# Patient Record
Sex: Female | Born: 1970 | Race: White | Hispanic: No | Marital: Married | Smoking: Never smoker
Health system: Southern US, Community
[De-identification: ages and names within clinical notes are randomized; demographics above are authoritative.]

## PROBLEM LIST (undated history)

## (undated) DIAGNOSIS — K219 Gastro-esophageal reflux disease without esophagitis: Secondary | ICD-10-CM

## (undated) DIAGNOSIS — G4733 Obstructive sleep apnea (adult) (pediatric): Secondary | ICD-10-CM

## (undated) DIAGNOSIS — E039 Hypothyroidism, unspecified: Secondary | ICD-10-CM

## (undated) DIAGNOSIS — S83519A Sprain of anterior cruciate ligament of unspecified knee, initial encounter: Secondary | ICD-10-CM

## (undated) DIAGNOSIS — C801 Malignant (primary) neoplasm, unspecified: Secondary | ICD-10-CM

## (undated) DIAGNOSIS — G8929 Other chronic pain: Secondary | ICD-10-CM

## (undated) DIAGNOSIS — R51 Headache: Secondary | ICD-10-CM

## (undated) DIAGNOSIS — A159 Respiratory tuberculosis unspecified: Secondary | ICD-10-CM

## (undated) DIAGNOSIS — I2699 Other pulmonary embolism without acute cor pulmonale: Secondary | ICD-10-CM

## (undated) DIAGNOSIS — R7303 Prediabetes: Secondary | ICD-10-CM

## (undated) DIAGNOSIS — Z973 Presence of spectacles and contact lenses: Secondary | ICD-10-CM

## (undated) DIAGNOSIS — E079 Disorder of thyroid, unspecified: Secondary | ICD-10-CM

## (undated) DIAGNOSIS — I10 Essential (primary) hypertension: Secondary | ICD-10-CM

## (undated) DIAGNOSIS — Z201 Contact with and (suspected) exposure to tuberculosis: Secondary | ICD-10-CM

## (undated) DIAGNOSIS — E785 Hyperlipidemia, unspecified: Secondary | ICD-10-CM

## (undated) DIAGNOSIS — E119 Type 2 diabetes mellitus without complications: Secondary | ICD-10-CM

## (undated) HISTORY — DX: Other chronic pain: G89.29

## (undated) HISTORY — DX: Other pulmonary embolism without acute cor pulmonale: I26.99

## (undated) HISTORY — DX: Headache: R51

## (undated) HISTORY — PX: COLONOSCOPY: SHX174

## (undated) HISTORY — PX: HERNIA REPAIR: SHX51

## (undated) HISTORY — DX: Disorder of thyroid, unspecified: E07.9

## (undated) HISTORY — DX: Hyperlipidemia, unspecified: E78.5

## (undated) HISTORY — DX: Essential (primary) hypertension: I10

## (undated) SURGERY — Surgical Case
Anesthesia: *Unknown

---

## 1999-04-08 ENCOUNTER — Emergency Department (HOSPITAL_COMMUNITY): Admission: EM | Admit: 1999-04-08 | Discharge: 1999-04-08 | Payer: Self-pay

## 2000-09-01 ENCOUNTER — Encounter: Admission: RE | Admit: 2000-09-01 | Discharge: 2000-10-07 | Payer: Self-pay | Admitting: Family Medicine

## 2000-11-30 ENCOUNTER — Other Ambulatory Visit: Admission: RE | Admit: 2000-11-30 | Discharge: 2000-11-30 | Payer: Self-pay | Admitting: Family Medicine

## 2003-08-07 ENCOUNTER — Other Ambulatory Visit: Admission: RE | Admit: 2003-08-07 | Discharge: 2003-08-07 | Payer: Self-pay | Admitting: Obstetrics and Gynecology

## 2004-03-17 ENCOUNTER — Other Ambulatory Visit: Admission: RE | Admit: 2004-03-17 | Discharge: 2004-03-17 | Payer: Self-pay | Admitting: Obstetrics and Gynecology

## 2004-06-01 DIAGNOSIS — I2699 Other pulmonary embolism without acute cor pulmonale: Secondary | ICD-10-CM

## 2004-06-01 HISTORY — PX: COLPOSCOPY: SHX161

## 2004-06-01 HISTORY — DX: Other pulmonary embolism without acute cor pulmonale: I26.99

## 2004-09-15 ENCOUNTER — Other Ambulatory Visit: Admission: RE | Admit: 2004-09-15 | Discharge: 2004-09-15 | Payer: Self-pay | Admitting: Obstetrics and Gynecology

## 2004-10-30 DIAGNOSIS — Z86711 Personal history of pulmonary embolism: Secondary | ICD-10-CM

## 2004-10-30 HISTORY — DX: Personal history of pulmonary embolism: Z86.711

## 2004-11-18 ENCOUNTER — Inpatient Hospital Stay (HOSPITAL_COMMUNITY): Admission: AD | Admit: 2004-11-18 | Discharge: 2004-11-20 | Payer: Self-pay | Admitting: *Deleted

## 2004-11-18 ENCOUNTER — Encounter: Admission: RE | Admit: 2004-11-18 | Discharge: 2004-11-18 | Payer: Self-pay | Admitting: Internal Medicine

## 2005-01-20 ENCOUNTER — Ambulatory Visit: Payer: Self-pay | Admitting: Internal Medicine

## 2005-02-03 ENCOUNTER — Ambulatory Visit: Payer: Self-pay | Admitting: Hematology & Oncology

## 2005-04-17 ENCOUNTER — Other Ambulatory Visit: Admission: RE | Admit: 2005-04-17 | Discharge: 2005-04-17 | Payer: Self-pay | Admitting: Obstetrics and Gynecology

## 2005-08-26 ENCOUNTER — Encounter: Admission: RE | Admit: 2005-08-26 | Discharge: 2005-08-26 | Payer: Self-pay | Admitting: Internal Medicine

## 2006-02-08 ENCOUNTER — Encounter: Admission: RE | Admit: 2006-02-08 | Discharge: 2006-02-08 | Payer: Self-pay | Admitting: Endocrinology

## 2006-10-21 ENCOUNTER — Other Ambulatory Visit: Admission: RE | Admit: 2006-10-21 | Discharge: 2006-10-21 | Payer: Self-pay | Admitting: Obstetrics and Gynecology

## 2007-12-07 ENCOUNTER — Other Ambulatory Visit: Admission: RE | Admit: 2007-12-07 | Discharge: 2007-12-07 | Payer: Self-pay | Admitting: Obstetrics and Gynecology

## 2008-01-03 ENCOUNTER — Encounter: Admission: RE | Admit: 2008-01-03 | Discharge: 2008-01-03 | Payer: Self-pay | Admitting: Obstetrics and Gynecology

## 2008-05-27 ENCOUNTER — Emergency Department (HOSPITAL_BASED_OUTPATIENT_CLINIC_OR_DEPARTMENT_OTHER): Admission: EM | Admit: 2008-05-27 | Discharge: 2008-05-27 | Payer: Self-pay | Admitting: Emergency Medicine

## 2008-12-07 ENCOUNTER — Other Ambulatory Visit: Admission: RE | Admit: 2008-12-07 | Discharge: 2008-12-07 | Payer: Self-pay | Admitting: Obstetrics and Gynecology

## 2010-01-15 ENCOUNTER — Other Ambulatory Visit: Admission: RE | Admit: 2010-01-15 | Discharge: 2010-01-15 | Payer: Self-pay | Admitting: Obstetrics and Gynecology

## 2010-06-21 ENCOUNTER — Encounter: Payer: Self-pay | Admitting: Hematology & Oncology

## 2010-06-23 ENCOUNTER — Encounter: Payer: Self-pay | Admitting: Endocrinology

## 2010-06-23 ENCOUNTER — Encounter: Payer: Self-pay | Admitting: Obstetrics and Gynecology

## 2010-10-17 NOTE — H&P (Signed)
NAMERHANDA, LEMIRE                 ACCOUNT NO.:  [id number]   MEDICAL RECORD NO.:  [id number]          PATIENT TYPE:  INP   LOCATION:  3709                         FACILITY:  [facility]   PHYSICIAN:  Theone Stanley, MD   DATE OF BIRTH:  December 18, 1970   DATE OF ADMISSION:  11/18/2004  DATE OF DISCHARGE:                                HISTORY & PHYSICAL   CHIEF COMPLAINT:  Chest pain.   HISTORY OF PRESENT ILLNESS:  Mrs. Holly Leach is a very pleasant 40 year old female  who has had chest pain since Friday that is on her left side just under her  breast. It mainly occurred when she was walking. She had seen Dr. Sharlet Salina. In regards to this, it was felt that she might have some indigestion  and possibly GERD. She was placed on AcipHex; however, this did not help her  symptoms. She has been feeling overall not well and subsequently had a CT  today. It was found that she had a left lower lobe filling defect per verbal  report. Because of this, the patient has been admitted to telemetry. In  addition, she has been complaining of increasing headaches about three times  a day. The chest pain she describes as a dull pain. It has not gotten any  worse, does not radiate, and she has no shortness of breath.   PAST MEDICAL HISTORY:  1.  Tension headaches.  2.  Migraines associated with her menstrual cycle.  3.  Hypertension.   MEDICATIONS:  1.  Micronor contraception.  2.  Micardis/hydrochlorothiazide 40/12.5 mg 1 p.o. daily.  3.  Ambien CR 6.25 mg q.h.s. p.r.n.  4.  AcipHex b.i.d.   ALLERGIES:  None.   PAST SURGICAL HISTORY:  None.   FAMILY HISTORY:  Heart disease and diabetes.   SOCIAL HISTORY:  The patient is married. Has one biological child and one  stepchild. No tobacco, alcohol, or illicit drug use.   REVIEW OF SYMPTOMS:  No fever or chills. No dysuria, no hematuria. No change  in bowel habits, blood in stools, or dark tarry stools.   PHYSICAL EXAMINATION:  VITAL SIGNS:   Temperature of 98, pulse of 103,  respiratory rate of 17, blood pressure of 139/98. Saturating 98% on room  air. Weight of 151.6 pounds.  HEENT:  Normocephalic and atraumatic. Eyes are 3 mm. Pupils are equal and  reactive to light. Extraocular movements intact. Ears note no discharge.  Throat clear. No erythema and no exudate. Moist mucus membranes.  NECK:  Supple. No thyromegaly. No JVD.  HEART:  Regular rate and rhythm. No murmurs, gallops, or rubs.  LUNGS:  Clear to auscultation bilaterally.  ABDOMEN:  Soft, nontender, and nondistended.  EXTREMITIES:  No clubbing, cyanosis, or edema.  NEUROLOGICAL:  The patient is alert and oriented x3.  GENITOURINARY:  Deferred.   LABORATORY DATA:  CBC, BNP, PT, PTT, INR are pending.   ASSESSMENT:  A 40 year old female on contraceptive medication presenting  with pulmonary embolus.   PLAN:  1.  PE:  We will start her on Lovenox and Coumadin. We  will follow her      INR's. Place her on telemetry. She is informed that she can no longer      take her oral contraceptives and she will need to follow up with her      OB/GYN in regards to what type of method she can use. In the meantime,      she can use Barrier method. Because she is on oral contraceptive      medication, which increases her likelihood of clots, I will not work her      up at this time. The patient was informed about the plan and the fact      that she will mostly be on Coumadin for at least six months to a year.  2.  Headaches:  We will give her Tylenol on a p.r.n. basis.  3.  Hypertension:  Continue on her hypertensive medications.       AEJ/MEDQ  D:  11/18/2004  T:  11/18/2004  Job:  [id number]   cc:   Sharlet Salina, M.D.  [address]  [place]  [place] [address]  Fax: [phone]

## 2011-05-15 ENCOUNTER — Other Ambulatory Visit (HOSPITAL_COMMUNITY)
Admission: RE | Admit: 2011-05-15 | Discharge: 2011-05-15 | Disposition: A | Discharge status: Home / Self Care | Payer: BC Managed Care – PPO | Source: Ambulatory Visit | Attending: Obstetrics and Gynecology | Admitting: Obstetrics and Gynecology

## 2011-05-15 ENCOUNTER — Other Ambulatory Visit: Payer: Self-pay | Admitting: Obstetrics and Gynecology

## 2011-05-15 DIAGNOSIS — Z01419 Encounter for gynecological examination (general) (routine) without abnormal findings: Secondary | ICD-10-CM | POA: Insufficient documentation

## 2011-05-15 DIAGNOSIS — Z1231 Encounter for screening mammogram for malignant neoplasm of breast: Secondary | ICD-10-CM

## 2011-06-22 ENCOUNTER — Ambulatory Visit
Admission: RE | Admit: 2011-06-22 | Discharge: 2011-06-22 | Disposition: A | Discharge status: Home / Self Care | Payer: BC Managed Care – PPO | Source: Ambulatory Visit | Attending: Obstetrics and Gynecology | Admitting: Obstetrics and Gynecology

## 2011-06-22 DIAGNOSIS — Z1231 Encounter for screening mammogram for malignant neoplasm of breast: Secondary | ICD-10-CM

## 2013-10-24 ENCOUNTER — Telehealth (INDEPENDENT_AMBULATORY_CARE_PROVIDER_SITE_OTHER): Payer: Self-pay

## 2013-10-24 NOTE — Telephone Encounter (Signed)
Pt states that she has not had any images done at this time. Informed pt that Dr Brantley Stage will evaluate her the day of her appt and if any images are needed, he will order them that day. Pt verbalized understanding and agrees with POC.

## 2013-10-24 NOTE — Telephone Encounter (Signed)
LMOM> if pt has not had any imaging done that is ok. He will evaluate her and if he needs any, he will order them. If pt did have imaging done, pls bring the reports to appt.

## 2013-10-24 NOTE — Telephone Encounter (Signed)
Message copied by Carlene Coria on Tue Oct 24, 2013  4:19 PM ------      Message from: Salvatore Marvel      Created: Tue Oct 24, 2013  1:33 PM      Regarding: Dr. Brantley Stage Question      Contact: [phone]       Patient called and has appoint on 11/03/13 for cyst on her neck and she wants to know if Dr. Brantley Stage needs to have X-rays or MRI before the appointment.             Thank you. ------

## 2013-11-03 ENCOUNTER — Encounter (INDEPENDENT_AMBULATORY_CARE_PROVIDER_SITE_OTHER): Payer: Self-pay | Admitting: Surgery

## 2013-11-03 ENCOUNTER — Ambulatory Visit (INDEPENDENT_AMBULATORY_CARE_PROVIDER_SITE_OTHER): Payer: PRIVATE HEALTH INSURANCE | Admitting: Surgery

## 2013-11-03 ENCOUNTER — Telehealth (INDEPENDENT_AMBULATORY_CARE_PROVIDER_SITE_OTHER): Payer: Self-pay | Admitting: *Deleted

## 2013-11-03 VITALS — BP 128/76 | HR 78 | Temp 98.0°F | Resp 18 | Ht 63.0 in | Wt 216.0 lb

## 2013-11-03 DIAGNOSIS — R220 Localized swelling, mass and lump, head: Secondary | ICD-10-CM

## 2013-11-03 DIAGNOSIS — E049 Nontoxic goiter, unspecified: Secondary | ICD-10-CM

## 2013-11-03 DIAGNOSIS — E010 Iodine-deficiency related diffuse (endemic) goiter: Secondary | ICD-10-CM

## 2013-11-03 DIAGNOSIS — R221 Localized swelling, mass and lump, neck: Secondary | ICD-10-CM

## 2013-11-03 NOTE — Patient Instructions (Signed)
Will need to evaluate thyroid gland and neck mass  with tests.

## 2013-11-03 NOTE — Progress Notes (Signed)
Patient ID: Holly Leach, female   DOB: Oct 19, 1970, 43 y.o.   MRN: [id number]  Chief Complaint  Patient presents with  . New Evaluation    neck seb. cyst    HPI Holly Leach is a 43 y.o. female.   HPI Patient sent at the request of Clyda Greener, MD for mass overlying right posterior neck. Patient is also complaining of mass involving right anterior neck. Both have been present for the last one to 2 months. Both are getting larger. She is having mild discomfort. Denies any numbness to arms or legs. Denies any significant neck pain. Denies any hoarseness, dysphasia or difficulty breathing.  History reviewed. No pertinent past medical history.  History reviewed. No pertinent past surgical history.  Family History  Problem Relation Age of Onset  . Hypertension Mother   . Hypertension Father   . Heart disease Father   . Hypertension Brother   . Hyperlipidemia Brother     Social History History  Substance Use Topics  . Smoking status: Not on file  . Smokeless tobacco: Not on file  . Alcohol Use: Not on file    No Known Allergies  Current Outpatient Prescriptions  Medication Sig Dispense Refill  . ibuprofen (ADVIL,MOTRIN) 800 MG tablet Take 800 mg by mouth every 8 (eight) hours as needed.      Marland Kitchen lisinopril-hydrochlorothiazide (PRINZIDE,ZESTORETIC) 10-12.5 MG per tablet Take 1 tablet by mouth daily.      . Melatonin 3 MG CAPS Take by mouth.       No current facility-administered medications for this visit.    Review of Systems Review of Systems  Constitutional: Negative for fever, chills and unexpected weight change.  HENT: Negative for congestion, hearing loss, sore throat, trouble swallowing and voice change.   Eyes: Negative for visual disturbance.  Respiratory: Negative for cough and wheezing.   Cardiovascular: Negative for chest pain, palpitations and leg swelling.  Gastrointestinal: Negative for nausea, vomiting, abdominal pain, diarrhea, constipation, blood  in stool, abdominal distention and anal bleeding.  Genitourinary: Negative for hematuria, vaginal bleeding and difficulty urinating.  Musculoskeletal: Negative for arthralgias.  Skin: Negative for rash and wound.  Neurological: Negative for seizures, syncope and headaches.  Hematological: Negative for adenopathy. Does not bruise/bleed easily.  Psychiatric/Behavioral: Negative for confusion.    Blood pressure 128/76, pulse 78, temperature 98 F (36.7 C), resp. rate 18, height 5\' 3"  (1.6 m), weight 216 lb (97.977 kg).  Physical Exam Physical Exam  Constitutional: She is oriented to person, place, and time. She appears well-developed and well-nourished.  HENT:  Head: Normocephalic and atraumatic.  Eyes: Pupils are equal, round, and reactive to light. No scleral icterus.  Neck: Trachea normal. Neck supple. No JVD present. No tracheal tenderness present. Thyromegaly present.    Cardiovascular: Normal rate and regular rhythm.   Pulmonary/Chest: Effort normal and breath sounds normal.  Abdominal: Soft.  Musculoskeletal: Normal range of motion.  Neurological: She is alert and oriented to person, place, and time.  Skin: Skin is warm and dry.  Psychiatric: She has a normal mood and affect. Her behavior is normal. Judgment and thought content normal.    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  Recommend CT scan of neck to better evaluate soft tissue swelling in patient's right posterior  neck. This is not a sebaceous cyst by exam. This most likely represents a lipoma but does not feel like a typical lipoma either. Discussed the above with the patient. Further recommendations once imaging done.       Holly Leach A. Holly Leach 11/03/2013, 12:50 PM

## 2013-11-03 NOTE — Telephone Encounter (Signed)
LM for pt regarding her [place] an CT. Please advise pt she is scheduled for 11-08-13[facility] [place] at [facility].  Advise pt no food after 12:00 that day.    Thanks!  Anderson Malta

## 2013-11-03 NOTE — Telephone Encounter (Signed)
Pt returned my call and she is now aware of the appts below.  Pt stated that she might have to reschedule, but she wouldn't know until Monday, 11-06-13.  I provided pt with GI phone number and advised her she could just call them if she needed to reschedule.  Pt verbalized understanding.  Anderson Malta

## 2013-11-08 ENCOUNTER — Ambulatory Visit
Admission: RE | Admit: 2013-11-08 | Discharge: 2013-11-08 | Disposition: A | Discharge status: Home / Self Care | Payer: No Typology Code available for payment source | Source: Ambulatory Visit | Attending: Surgery | Admitting: Surgery

## 2013-11-08 DIAGNOSIS — E010 Iodine-deficiency related diffuse (endemic) goiter: Secondary | ICD-10-CM

## 2013-11-08 DIAGNOSIS — R221 Localized swelling, mass and lump, neck: Secondary | ICD-10-CM

## 2013-11-08 MED ORDER — IOHEXOL 300 MG/ML  SOLN
75.0000 mL | Freq: Once | INTRAMUSCULAR | Status: AC | PRN
  Administered 2013-11-08: 75 mL via INTRAVENOUS

## 2013-11-09 ENCOUNTER — Telehealth (INDEPENDENT_AMBULATORY_CARE_PROVIDER_SITE_OTHER): Payer: Self-pay

## 2013-11-09 NOTE — Telephone Encounter (Signed)
Message copied by Ivor Costa on Thu Nov 09, 2013  3:32 PM ------      Message from: Erroll Luna A      Created: Thu Nov 09, 2013  7:40 AM       NORMAL THYROID. ------

## 2013-11-09 NOTE — Telephone Encounter (Signed)
Patient aware of [place] results (Normal Thyroid)

## 2013-11-09 NOTE — Telephone Encounter (Signed)
Patient given results of her Neck CT (Excess fatty tissue, no tumor or Lipoma)  No medical indication to remove.

## 2013-11-09 NOTE — Telephone Encounter (Signed)
Message copied by Ivor Costa on Thu Nov 09, 2013  3:33 PM ------      Message from: Erroll Luna A      Created: Wed Nov 08, 2013  5:01 PM       Excess fatty tissue but no tumor or lipoma.  Thyroid looks normal. No medical indication to  remove  Tissue.  Insurance would consider this cosmetic and  Not cover it. ------

## 2014-01-11 ENCOUNTER — Other Ambulatory Visit: Payer: Self-pay

## 2014-01-11 DIAGNOSIS — Z1231 Encounter for screening mammogram for malignant neoplasm of breast: Secondary | ICD-10-CM

## 2014-02-02 ENCOUNTER — Ambulatory Visit: Payer: No Typology Code available for payment source

## 2014-02-02 ENCOUNTER — Ambulatory Visit
Admission: RE | Admit: 2014-02-02 | Discharge: 2014-02-02 | Disposition: A | Discharge status: Home / Self Care | Payer: PRIVATE HEALTH INSURANCE | Source: Ambulatory Visit

## 2014-02-02 DIAGNOSIS — Z1231 Encounter for screening mammogram for malignant neoplasm of breast: Secondary | ICD-10-CM

## 2014-11-30 ENCOUNTER — Ambulatory Visit (INDEPENDENT_AMBULATORY_CARE_PROVIDER_SITE_OTHER): Payer: Commercial Managed Care - PPO | Admitting: Podiatry

## 2014-11-30 DIAGNOSIS — M722 Plantar fascial fibromatosis: Secondary | ICD-10-CM

## 2014-11-30 NOTE — Progress Notes (Signed)
Patient ID: Holly Leach, female   DOB: [birth year], 44 y.o.   MRN: [id number] Patient presents for orthotic pick up.  Written break in and wear instructions given.    A. Plantar rfascitis  P.  Dispense full length orthoses and she says they fetl much better.

## 2015-02-12 ENCOUNTER — Other Ambulatory Visit: Payer: Self-pay | Admitting: Podiatry

## 2015-02-12 NOTE — Telephone Encounter (Signed)
Pt request refill of Mobic 15 mg.

## 2015-03-28 ENCOUNTER — Ambulatory Visit (INDEPENDENT_AMBULATORY_CARE_PROVIDER_SITE_OTHER): Payer: Commercial Managed Care - PPO | Admitting: Podiatry

## 2015-03-28 DIAGNOSIS — M722 Plantar fascial fibromatosis: Secondary | ICD-10-CM

## 2015-03-28 MED ORDER — METHYLPREDNISOLONE 4 MG PO TBPK: ORAL_TABLET | ORAL | Status: DC

## 2015-03-28 NOTE — Progress Notes (Signed)
   Subjective:    Patient ID: Holly Leach, female    DOB: 1971/02/20, 44 y.o.   MRN: [id number]  HPIThis patient presents to the office saying her feet continue to hurt despite wearing her orthoses.  She says the orthoses seem to need addition arch padding.  She relates she feels stretching through the bottom of both feet.  Pain noted distal to insertion of plantar fascia.    Patient said that her Orthothics are not helping her feet, they still hurt.she also shows me her shoes which are almost minimalist in nature.  Review of Systems  All other systems reviewed and are negative.      Objective:   Physical Exam GENERAL APPEARANCE: Alert, conversant. Appropriately groomed. No acute distress.  VASCULAR: Pedal pulses palpable at  [facility] and PT bilateral.  Capillary refill time is immediate to all digits,  Normal temperature gradient.  Digital hair growth is present bilateral  NEUROLOGIC: sensation is normal to 5.07 monofilament at 5/5 sites bilateral.  Light touch is intact bilateral, Muscle strength normal.  MUSCULOSKELETAL: acceptable muscle strength, tone and stability bilateral.  Intrinsic muscluature intact bilateral.  Rectus appearance of foot and digits noted bilateral. Pain palpated through  plantar fascia B/l.  No redness or swelling noted through her heels. DERMATOLOGIC: skin color, texture, and turgor are within normal limits.  No preulcerative lesions or ulcers  are seen, no interdigital maceration noted.  No open lesions present.  Digital nails are asymptomatic. No drainage noted.         Assessment & Plan:  Plantar fascitis B/l  ROV>  Added padding to arch.  Orthoses fit well but her shoes are almost minimalist in nature and I instructed her to use orthoses in better shoes.  If problem persists she needs to make an appointment with [place] doctors.

## 2015-05-09 ENCOUNTER — Telehealth: Payer: Self-pay | Admitting: Hematology & Oncology

## 2015-05-31 ENCOUNTER — Telehealth: Payer: Self-pay | Admitting: Hematology & Oncology

## 2015-05-31 NOTE — Telephone Encounter (Signed)
Left a message for pt to return my call regarding a new pt appt.       [facility].

## 2015-06-24 ENCOUNTER — Other Ambulatory Visit: Payer: Self-pay | Admitting: Family

## 2015-06-24 DIAGNOSIS — Z8759 Personal history of other complications of pregnancy, childbirth and the puerperium: Secondary | ICD-10-CM

## 2015-06-25 ENCOUNTER — Other Ambulatory Visit (HOSPITAL_BASED_OUTPATIENT_CLINIC_OR_DEPARTMENT_OTHER): Payer: Commercial Managed Care - PPO

## 2015-06-25 DIAGNOSIS — Z8759 Personal history of other complications of pregnancy, childbirth and the puerperium: Secondary | ICD-10-CM

## 2015-06-25 DIAGNOSIS — Z8742 Personal history of other diseases of the female genital tract: Secondary | ICD-10-CM

## 2015-06-25 LAB — CBC WITH DIFFERENTIAL (CANCER CENTER ONLY)
BASO#: 0 10*3/uL (ref 0.0–0.2)
BASO%: 0.5 % (ref 0.0–2.0)
EOS%: 2 % (ref 0.0–7.0)
Eosinophils Absolute: 0.2 10*3/uL (ref 0.0–0.5)
HCT: 37.1 % (ref 34.8–46.6)
HGB: 12.6 g/dL (ref 11.6–15.9)
LYMPH#: 2.3 10*3/uL (ref 0.9–3.3)
LYMPH%: 29.4 % (ref 14.0–48.0)
MCH: 27.2 pg (ref 26.0–34.0)
MCHC: 34 g/dL (ref 32.0–36.0)
MCV: 80 fL — AB (ref 81–101)
MONO#: 0.4 10*3/uL (ref 0.1–0.9)
MONO%: 4.6 % (ref 0.0–13.0)
NEUT#: 5 10*3/uL (ref 1.5–6.5)
NEUT%: 63.5 % (ref 39.6–80.0)
PLATELETS: 268 10*3/uL (ref 145–400)
RBC: 4.64 10*6/uL (ref 3.70–5.32)
RDW: 14.9 % (ref 11.1–15.7)
WBC: 7.8 10*3/uL (ref 3.9–10.0)

## 2015-06-26 ENCOUNTER — Ambulatory Visit (HOSPITAL_BASED_OUTPATIENT_CLINIC_OR_DEPARTMENT_OTHER): Payer: Commercial Managed Care - PPO | Admitting: Hematology & Oncology

## 2015-06-26 ENCOUNTER — Encounter: Payer: Self-pay | Admitting: Hematology & Oncology

## 2015-06-26 ENCOUNTER — Ambulatory Visit: Payer: PRIVATE HEALTH INSURANCE

## 2015-06-26 VITALS — BP 131/84 | HR 95 | Temp 97.9°F | Resp 18 | Ht 63.0 in | Wt 221.0 lb

## 2015-06-26 DIAGNOSIS — Z8742 Personal history of other diseases of the female genital tract: Secondary | ICD-10-CM

## 2015-06-26 DIAGNOSIS — E669 Obesity, unspecified: Secondary | ICD-10-CM

## 2015-06-26 LAB — PROTEIN C, TOTAL: PROTEIN C ANTIGEN: 133 % (ref 60–150)

## 2015-06-26 NOTE — Progress Notes (Signed)
Referral MD  Reason for Referral: Possible pulmonary embolism 10 years ago. Patient now wants to go on oral contraceptives   Chief Complaint  Patient presents with  . OTHER    New Patient  : I will like to be on birth control pill to help my feeling better.  HPI: Holly Leach is a 45 year old white female. I saw her 10 years ago. At that time, there was the question of whether or not she had a pulmonary embolism. She had presented to the emergency room. A CT angiogram was normal. She had ultrasound of her legs which were normal. However, a pulmonologist felt that she likely had a pulmonary embolism. However, she was not placed on any type of anticoagulation.  She has a past history of a twin pregnancy that ended with both was done early. She has a 44 year old now he would liver preterm.  She lied to go on oral contraceptives because she just has changes in her cycle, and in her overall well-being. She has gained quite a bit of weight.  Her gynecologist is concerned about putting her on oral contraceptives givien the history that she had. As such, she was kindly referred back to [place] for an evaluation.  She is working without difficulties. She's had no problem with surgery since we last saw her. She's had no change in bowel or bladder habits. She still has her monthly cycles. They are little bit erratic and heavy. She gets her mammograms.  She's had no leg swelling. She's had no pain in the legs. She's had no cough or shortness of breath.  There's not been any headache. She's had no palpable lymph nodes.  Overall, her performance status is ECOG 0.   No past medical history on file.:  No past surgical history on file.:   Current outpatient prescriptions:  .  ibuprofen (ADVIL,MOTRIN) 800 MG tablet, Take 800 mg by mouth every 8 (eight) hours as needed., Disp: , Rfl:  .  lisinopril-hydrochlorothiazide (PRINZIDE,ZESTORETIC) 10-12.5 MG per tablet, Take 1 tablet by mouth daily., Disp: , Rfl:   .  Melatonin 3 MG CAPS, Take by mouth., Disp: , Rfl:  .  meloxicam (MOBIC) 15 MG tablet, TAKE 1 TABLET BY MOUTH EVERY MORNING, Disp: 30 tablet, Rfl: 0:  :  Allergies  Allergen Reactions  . No Known Allergies   :  Family History  Problem Relation Age of Onset  . Hypertension Mother   . Hypertension Father   . Heart disease Father   . Hypertension Brother   . Hyperlipidemia Brother   :  Social History   Social History  . Marital Status: Married    Spouse Name: N/A  . Number of Children: N/A  . Years of Education: N/A   Occupational History  . Not on file.   Social History Main Topics  . Smoking status: Never Smoker   . Smokeless tobacco: Not on file  . Alcohol Use: Not on file  . Drug Use: Not on file  . Sexual Activity: Not on file   Other Topics Concern  . Not on file   Social History Narrative  :  Pertinent items are noted in HPI.  Exam: @IPVITALS @  obese white female in no obvious distress. Her vital signs show a temperature of 98. Pulse 78. Blood pressure 131/84. Weight is 221 pounds. Head and neck exam shows no ocular or oral lesions. There are no palpable cervical or supraclavicular lymph nodes. Lungs are clear. Cardiac exam regular rate and rhythm with  no murmurs, rubs or bruits. Abdomen is soft. She has good bowel sounds. There is no palpable liver mass. Is no palpable spleen tip. Back exam shows no tenderness over the spine, ribs or hips. Extremities shows no clubbing, cyanosis or edema. She has negative Homans sign in the legs. She has no palpable venous cord in the legs. She has good pulses in her distal extremities. Skin exam shows no rashes, ecchymoses or petechia. Neurological exam shows no focal neurological deficits.    Recent Labs  06/25/15 0754  WBC 7.8  HGB 12.6  HCT 37.1  PLT 268   No results for input(s): NA, K, CL, CO2, GLUCOSE, BUN, CREATININE, CALCIUM in the last 72 hours.  Blood smear review:  None  Pathology: None      Assessment and Plan:  Holly Leach is a 45 year old white female. She like to be on oral contraceptives. She has no documented thromboembolic disease. She was felt to have a pulmonary embolism back 10 years ago. However, CT angiogram was not remarkable for this. She had Dopplers of her legs that were negative.  I am interested in the fact that she did have the pregnancy issues.  We did a comprehensive hypercoagulable panel on her today. I will get those results back in a week.  I think that if she does not have a thrombophilic state, then she go on oral contraceptives. I told her that she goes on oral contraceptives, it may not be a bad idea to take low-dose aspirin (81 mg by mouth daily).  She is active. She has no diabetes. She has not smoked. She was no other risk factors for thrombo-embolic disease. She is somewhat sedentary at work which may be construed as a negative factor.  It was nice to see her again. She really looks quite good. Hopefully, we will be able to get her on oral contraceptives which will make her life better.  Spent about 45 minutes with her today.

## 2015-06-27 LAB — CARDIOLIPIN ANTIBODIES, IGG, IGM, IGA
Anticardiolipin Ab,IgA,Qn: <9 APL U/mL (ref 0–11)
Anticardiolipin Ab,IgG,Qn: <9 GPL U/mL (ref 0–14)
Anticardiolipin Ab,IgM,Qn: <9 MPL U/mL (ref 0–12)

## 2015-06-27 LAB — LUPUS ANTICOAGULANT PANEL
DRVVT: 38.8 s (ref 0.0–44.0)
PTT-LA: 36.3 s (ref 0.0–40.6)

## 2015-06-27 LAB — PROTEIN S, TOTAL: Protein S, Total: 115 % (ref 60–150)

## 2015-06-27 LAB — ANTITHROMBIN III: ANTITHROMBIN ACTIVITY: 119 % (ref 75–135)

## 2015-06-27 LAB — PROTEIN C ACTIVITY: Protein C-Functional: 149 % (ref 73–180)

## 2015-06-27 LAB — BETA-2-GLYCOPROTEIN I ABS, IGG/M/A

## 2015-06-27 LAB — PROTEIN S ACTIVITY: Protein S-Functional: 89 % (ref 63–140)

## 2015-06-28 ENCOUNTER — Other Ambulatory Visit: Payer: PRIVATE HEALTH INSURANCE

## 2015-06-28 ENCOUNTER — Ambulatory Visit: Payer: PRIVATE HEALTH INSURANCE

## 2015-06-28 ENCOUNTER — Ambulatory Visit: Payer: PRIVATE HEALTH INSURANCE | Admitting: Hematology & Oncology

## 2015-07-01 LAB — PROTHROMBIN GENE MUTATION

## 2015-07-01 LAB — FACTOR 5 LEIDEN

## 2015-07-03 ENCOUNTER — Telehealth: Payer: Self-pay | Admitting: *Deleted

## 2015-07-03 NOTE — Telephone Encounter (Addendum)
-----   Message from Volanda Napoleon, MD sent at 07/02/2015  7:12 AM EST ----- Call - ALL coagulation studies are normal!!!  Laurey Arrow

## 2015-07-04 ENCOUNTER — Telehealth: Payer: Self-pay | Admitting: *Deleted

## 2015-07-04 NOTE — Telephone Encounter (Addendum)
Patient aware of results  ----- Message from Volanda Napoleon, MD sent at 07/02/2015  7:12 AM EST ----- Call - ALL coagulation studies are normal!!!  Laurey Arrow

## 2015-07-24 ENCOUNTER — Encounter: Payer: Self-pay | Admitting: Gastroenterology

## 2015-09-17 ENCOUNTER — Ambulatory Visit: Payer: Commercial Managed Care - PPO | Admitting: Gastroenterology

## 2015-11-15 ENCOUNTER — Ambulatory Visit (INDEPENDENT_AMBULATORY_CARE_PROVIDER_SITE_OTHER): Payer: Commercial Managed Care - PPO | Admitting: Gastroenterology

## 2015-11-15 ENCOUNTER — Encounter: Payer: Self-pay | Admitting: Gastroenterology

## 2015-11-15 VITALS — BP 132/76 | HR 99 | Ht 63.0 in | Wt 210.0 lb

## 2015-11-15 DIAGNOSIS — K625 Hemorrhage of anus and rectum: Secondary | ICD-10-CM

## 2015-11-15 NOTE — Progress Notes (Signed)
[facility] Consult Note:  History: Holly Leach 11/15/2015  Referring physician: Nilda Simmer, MD  Reason for consult/chief complaint: Rectal Bleeding   Subjective HPI:  Holly Leach was referred by her gynecologist for an episode of rectal bleeding because she wanted to consider hemorrhoidal banding. She had 2 days of rectal bleeding last December, perhaps during an episode of constipation. She has been regular since then and no further episodes of bleeding. She denies abdominal pain or weight loss.   ROS:  Review of Systems She denies chest pain dyspnea or dysuria  Past Medical History: Past Medical History  Diagnosis Date  . Hypertension   . Chronic headaches   . PE (pulmonary embolism) 2006     Past Surgical History: History reviewed. No pertinent past surgical history.   Family History: Family History  Problem Relation Age of Onset  . Hypertension Mother   . Hypertension Father   . Heart disease Father   . Hypertension Brother   . Hyperlipidemia Brother   . Diabetes Paternal Grandmother   . Irritable bowel syndrome Sister   . Irritable bowel syndrome Maternal Aunt     Social History: Social History   Social History  . Marital Status: Married    Spouse Name: N/A  . Number of Children: 1  . Years of Education: N/A   Occupational History  . Web designer    Social History Main Topics  . Smoking status: Never Smoker   . Smokeless tobacco: None  . Alcohol Use: No  . Drug Use: No  . Sexual Activity: Not Asked   Other Topics Concern  . None   Social History Narrative    Allergies: Allergies  Allergen Reactions  . No Known Allergies     Outpatient Meds: Current Outpatient Prescriptions  Medication Sig Dispense Refill  . amoxicillin (AMOXIL) 500 MG capsule Take 500 mg by mouth 3 (three) times daily.    Marland Kitchen BENZONATATE PO Take 1 capsule by mouth daily.    Marland Kitchen ibuprofen (ADVIL,MOTRIN) 800 MG tablet Take 800 mg by mouth every  8 (eight) hours as needed.    Marland Kitchen lisinopril-hydrochlorothiazide (PRINZIDE,ZESTORETIC) 10-12.5 MG per tablet Take 1 tablet by mouth daily.    Marland Kitchen loratadine (CLARITIN) 10 MG tablet Take 10 mg by mouth daily.    . Melatonin 3 MG CAPS Take by mouth.     No current facility-administered medications for this visit.      ___________________________________________________________________ Objective  Exam:  BP 132/76 mmHg  Pulse 99  Ht 5\' 3"  (1.6 m)  Wt 210 lb (95.255 kg)  BMI 37.21 kg/m2   General: this is a(n) well-appearing woman   Eyes: sclera anicteric, no redness  ENT: oral mucosa moist without lesions, no cervical or supraclavicular lymphadenopathy, good dentition  CV: RRR without murmur, S1/S2, no JVD, no peripheral edema  Resp: clear to auscultation bilaterally, normal RR and effort noted  GI: soft, obese, no tenderness, with active bowel sounds. No guarding or palpable organomegaly noted.  Skin; warm and dry, no rash or jaundice noted  Neuro: awake, alert and oriented x 3. Normal gross motor function and fluent speech Rectal exam chaperoned by our MA Toni: Normal sphincter tone, no tenderness no palpable internal lesions   Assessment: Encounter Diagnosis  Name Primary?  . Rectal bleeding Yes   Most likely hemorrhoidal, but I advised her to have a colonoscopy to rule out neoplasia. She would like to check with her insurance first regarding the cost. If colonoscopy performed and no other source  besides hemorrhoids, then it is not yet clear banding is even necessary since she only had one episode of bleeding.   Thank you for the courtesy of this consult.  Please call me with any questions or concerns.  Nelida Meuse III  CCNilda Simmer, MD

## 2015-11-15 NOTE — Patient Instructions (Signed)
It has been recommended to you by your physician that you have a(n) Colonoscopy completed. Per your request, we did not schedule the procedure(s) today. Please contact our office at [phone] should you decide to have the procedure completed.  CPT code [phone]  If you are age 44 or older, your body mass index should be between 23-30. Your Body mass index is 37.21 kg/(m^2). If this is out of the aforementioned range listed, please consider follow up with your Primary Care Provider.  If you are age 21 or younger, your body mass index should be between 19-25. Your Body mass index is 37.21 kg/(m^2). If this is out of the aformentioned range listed, please consider follow up with your Primary Care Provider.   Thank you for choosing [facility]  Dr Wilfrid Lund III

## 2016-02-11 ENCOUNTER — Encounter: Payer: Self-pay | Admitting: Gastroenterology

## 2016-04-10 ENCOUNTER — Encounter: Payer: Commercial Managed Care - PPO | Admitting: Gastroenterology

## 2016-08-30 ENCOUNTER — Encounter (HOSPITAL_BASED_OUTPATIENT_CLINIC_OR_DEPARTMENT_OTHER): Payer: Self-pay | Admitting: Emergency Medicine

## 2016-08-30 ENCOUNTER — Emergency Department (HOSPITAL_BASED_OUTPATIENT_CLINIC_OR_DEPARTMENT_OTHER)
Admission: EM | Admit: 2016-08-30 | Discharge: 2016-08-30 | Disposition: A | Discharge status: Home / Self Care | Payer: BLUE CROSS/BLUE SHIELD | Attending: Emergency Medicine | Admitting: Emergency Medicine

## 2016-08-30 DIAGNOSIS — R1084 Generalized abdominal pain: Secondary | ICD-10-CM | POA: Diagnosis not present

## 2016-08-30 DIAGNOSIS — Z79899 Other long term (current) drug therapy: Secondary | ICD-10-CM | POA: Insufficient documentation

## 2016-08-30 DIAGNOSIS — I10 Essential (primary) hypertension: Secondary | ICD-10-CM | POA: Insufficient documentation

## 2016-08-30 LAB — URINALYSIS, ROUTINE W REFLEX MICROSCOPIC
BILIRUBIN URINE: NEGATIVE
Glucose, UA: NEGATIVE mg/dL
Hgb urine dipstick: NEGATIVE
Ketones, ur: NEGATIVE mg/dL
NITRITE: NEGATIVE
PH: 5.5 (ref 5.0–8.0)
Protein, ur: NEGATIVE mg/dL
SPECIFIC GRAVITY, URINE: 1.007 (ref 1.005–1.030)

## 2016-08-30 LAB — COMPREHENSIVE METABOLIC PANEL
ALBUMIN: 3.8 g/dL (ref 3.5–5.0)
ALT: 17 U/L (ref 14–54)
AST: 18 U/L (ref 15–41)
Alkaline Phosphatase: 52 U/L (ref 38–126)
Anion gap: 10 (ref 5–15)
BILIRUBIN TOTAL: 0.5 mg/dL (ref 0.3–1.2)
BUN: 17 mg/dL (ref 6–20)
CO2: 22 mmol/L (ref 22–32)
Calcium: 9.5 mg/dL (ref 8.9–10.3)
Chloride: 103 mmol/L (ref 101–111)
Creatinine, Ser: 1.02 mg/dL — ABNORMAL HIGH (ref 0.44–1.00)
GFR calc non Af Amer: >60 mL/min (ref >60)
GLUCOSE: 110 mg/dL — AB (ref 65–99)
POTASSIUM: 3.7 mmol/L (ref 3.5–5.1)
SODIUM: 135 mmol/L (ref 135–145)
TOTAL PROTEIN: 7.3 g/dL (ref 6.5–8.1)

## 2016-08-30 LAB — CBC
HEMATOCRIT: 40.1 % (ref 36.0–46.0)
HEMOGLOBIN: 13.7 g/dL (ref 12.0–15.0)
MCH: 28.4 pg (ref 26.0–34.0)
MCHC: 34.2 g/dL (ref 30.0–36.0)
MCV: 83.2 fL (ref 78.0–100.0)
Platelets: 331 10*3/uL (ref 150–400)
RBC: 4.82 MIL/uL (ref 3.87–5.11)
RDW: 15.8 % — ABNORMAL HIGH (ref 11.5–15.5)
WBC: 11 10*3/uL — ABNORMAL HIGH (ref 4.0–10.5)

## 2016-08-30 LAB — URINALYSIS, MICROSCOPIC (REFLEX)

## 2016-08-30 LAB — LIPASE, BLOOD: Lipase: 16 U/L (ref 11–51)

## 2016-08-30 MED ORDER — DICYCLOMINE HCL 20 MG PO TABS: 20.0000 mg | ORAL_TABLET | Freq: Two times a day (BID) | ORAL | 0 refills | Status: DC

## 2016-08-30 MED ORDER — GI COCKTAIL ~~LOC~~
30.0000 mL | Freq: Once | ORAL | Status: AC
  Administered 2016-08-30: 30 mL via ORAL
  Filled 2016-08-30: qty 30

## 2016-08-30 MED ORDER — IBUPROFEN 600 MG PO TABS
600.0000 mg | ORAL_TABLET | Freq: Four times a day (QID) | ORAL | 0 refills | Status: AC | PRN
Start: 2016-08-30 — End: 2016-09-04

## 2016-08-30 MED ORDER — SUCRALFATE 1 GM/10ML PO SUSP: 1.0000 g | Freq: Three times a day (TID) | ORAL | 0 refills | Status: DC

## 2016-08-30 MED ORDER — KETOROLAC TROMETHAMINE 30 MG/ML IJ SOLN
30.0000 mg | Freq: Once | INTRAMUSCULAR | Status: AC
  Administered 2016-08-30: 30 mg via INTRAVENOUS
  Filled 2016-08-30: qty 1

## 2016-08-30 MED ORDER — DICYCLOMINE HCL 10 MG PO CAPS
10.0000 mg | ORAL_CAPSULE | Freq: Once | ORAL | Status: AC
  Administered 2016-08-30: 10 mg via ORAL
  Filled 2016-08-30: qty 1

## 2016-08-30 NOTE — ED Triage Notes (Signed)
PT presents to ed with c/o abdominal pain that started last night.  Pt sts she had nausea last night but not today.

## 2016-08-30 NOTE — ED Provider Notes (Signed)
[facility] Provider Note   CSN: [id number] Arrival date & time: 08/30/16  2102   By signing my name below, I, Holly Leach, attest that this documentation has been prepared under the direction and in the presence of Leo Grosser, MD. Electronically Signed: Soijett Leach, ED Scribe. 08/30/16. 10:30 PM.  History   Chief Complaint Chief Complaint  Patient presents with  . Abdominal Pain    HPI Holly Leach is a 46 y.o. female with a PMHx of HTN, who presents to the Emergency Department complaining of constant, sharp, generalized abdominal pain x cramping sensation onset last night. She notes that her abdominal pain is worsened with deep breathing. Pt reports associated resolved nausea, and resolved subjective fever. Pt has not tried any medications for the relief of her symptoms. She denies vomiting, diarrhea, constipation, and any other symptoms.   The history is provided by the patient and the spouse. No language interpreter was used.    Past Medical History:  Diagnosis Date  . Chronic headaches   . Hypertension   . PE (pulmonary embolism) 2006    Patient Active Problem List   Diagnosis Date Noted  . Neck mass 11/03/2013  . 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/03/2013    History reviewed. No pertinent surgical history.  OB History    No data available       Home Medications    Prior to Admission medications   Medication Sig Start Date End Date Taking? Authorizing Provider  amoxicillin (AMOXIL) 500 MG capsule Take 500 mg by mouth 3 (three) times daily.    Historical Provider, MD  BENZONATATE PO Take 1 capsule by mouth daily.    Historical Provider, MD  ibuprofen (ADVIL,MOTRIN) 800 MG tablet Take 800 mg by mouth every 8 (eight) hours as needed.    Historical Provider, MD  lisinopril-hydrochlorothiazide (PRINZIDE,ZESTORETIC) 10-12.5 MG per tablet Take 1 tablet by mouth daily.    Historical Provider, MD  loratadine (CLARITIN) 10 MG tablet Take 10 mg by mouth daily.     Historical Provider, MD  Melatonin 3 MG CAPS Take by mouth.    Historical Provider, MD    Family History Family History  Problem Relation Age of Onset  . Hypertension Mother   . Hypertension Father   . Heart disease Father   . Hypertension Brother   . Hyperlipidemia Brother   . Diabetes Paternal Grandmother   . Irritable bowel syndrome Sister   . Irritable bowel syndrome Maternal Aunt     Social History Social History  Substance Use Topics  . Smoking status: Never Smoker  . Smokeless tobacco: Not on file  . Alcohol use No     Allergies   No known allergies   Review of Systems Review of Systems  All other systems reviewed and are negative.    Physical Exam Updated Vital Signs BP (!) 156/107 (BP Location: Left Arm)   Pulse (!) 104   Temp 98.2 F (36.8 C) (Oral)   Resp 20   Ht 5\' 3"  (1.6 m)   Wt 197 lb (89.4 kg)   SpO2 96%   BMI 34.90 kg/m   Physical Exam  Constitutional: She is oriented to person, place, and time. She appears well-developed and well-nourished. No distress.  HENT:  Head: Normocephalic.  Nose: Nose normal.  Eyes: Conjunctivae are normal.  Neck: Neck supple. No tracheal deviation present.  Cardiovascular: Normal rate and regular rhythm.   Pulmonary/Chest: Effort normal. No respiratory distress.  Abdominal: Soft. She exhibits no distension. Bowel sounds  are increased. There is generalized tenderness and tenderness in the left upper quadrant. There is no rigidity, no rebound, no guarding, no tenderness at McBurney's point and negative Murphy's sign.  Diffuse tenderness. Tenderness mostly localized to LUQ. No rebound, guarding, rigidity. Negative Murphy's. Negative Mcburney's. Hyperactive bowel sounds.  Neurological: She is alert and oriented to person, place, and time.  Skin: Skin is warm and dry.  Psychiatric: She has a normal mood and affect.     ED Treatments / Results  DIAGNOSTIC STUDIES: Oxygen Saturation is 96% on RA, nl by my  interpretation.    COORDINATION OF CARE: 10:36 PM Discussed treatment plan with pt at bedside which includes labs, UA, bentyl, follow up with PCP, and pt agreed to plan.   Labs (all labs ordered are listed, but only abnormal results are displayed) Labs Reviewed  COMPREHENSIVE METABOLIC PANEL - Abnormal; Notable for the following:       Result Value   Glucose, Bld 110 (*)    Creatinine, Ser 1.02 (*)    All other components within normal limits  CBC - Abnormal; Notable for the following:    WBC 11.0 (*)    RDW 15.8 (*)    All other components within normal limits  URINALYSIS, ROUTINE W REFLEX MICROSCOPIC - Abnormal; Notable for the following:    Leukocytes, UA SMALL (*)    All other components within normal limits  URINALYSIS, MICROSCOPIC (REFLEX) - Abnormal; Notable for the following:    Bacteria, UA MANY (*)    Squamous Epithelial / LPF 0-5 (*)    All other components within normal limits  LIPASE, BLOOD    EKG  EKG Interpretation None       Radiology No results found.  Procedures Procedures (including critical care time)  Medications Ordered in ED Medications  ketorolac (TORADOL) 30 MG/ML injection 30 mg (30 mg Intravenous Given 08/30/16 2258)  gi cocktail (Maalox,Lidocaine,Donnatal) (30 mLs Oral Given 08/30/16 2258)  dicyclomine (BENTYL) capsule 10 mg (10 mg Oral Given 08/30/16 2258)     Initial Impression / Assessment and Plan / ED Course  I have reviewed the triage vital signs and the nursing notes.  Pertinent labs & imaging results that were available during my care of the patient were reviewed by me and considered in my medical decision making (see chart for details).     46 year old female presents with diffuse, sharp abdominal pain over the last 24-48 hours. She is well-appearing, afebrile with stable vital signs and has no focal abdominal tenderness. She appears to have waves of intermittent sharp cramping pain is more consistent with gaseousness or  enteritis. She has had no vomiting, nausea or diarrhea to accompany this. Labs are reassuring, no urinary symptoms or signs of urinary tract infection. She has no signs of cholecystitis, appendicitis, or other surgical etiology currently. No indication for emergent imaging. I recommended supportive care measures and patient was provided NSAIDs, Bentyl and Carafate for home with plan to follow up with primary care physician for recheck within the next few days if symptoms remain unchanged. Return precautions were discussed for focal abdominal pain, inability to tolerate fluids by mouth, developing fevers, or other concerning signs or symptoms.  Final Clinical Impressions(s) / ED Diagnoses   Final diagnoses:  Generalized abdominal pain    New Prescriptions New Prescriptions   DICYCLOMINE (BENTYL) 20 MG TABLET    Take 1 tablet (20 mg total) by mouth 2 (two) times daily.   IBUPROFEN (ADVIL,MOTRIN) 600 MG TABLET  Take 1 tablet (600 mg total) by mouth every 6 (six) hours as needed for mild pain or moderate pain.   SUCRALFATE (CARAFATE) 1 GM/10ML SUSPENSION    Take 10 mLs (1 g total) by mouth 4 (four) times daily -  with meals and at bedtime.   I personally performed the services described in this documentation, which was scribed in my presence. The recorded information has been reviewed and is accurate.     Leo Grosser, MD 08/30/16 [phone]

## 2016-09-28 ENCOUNTER — Encounter: Payer: Self-pay | Admitting: Gastroenterology

## 2016-09-28 ENCOUNTER — Ambulatory Visit (INDEPENDENT_AMBULATORY_CARE_PROVIDER_SITE_OTHER): Payer: BLUE CROSS/BLUE SHIELD | Admitting: Gastroenterology

## 2016-09-28 VITALS — BP 136/62 | HR 84 | Ht 63.0 in | Wt 198.0 lb

## 2016-09-28 DIAGNOSIS — R1013 Epigastric pain: Secondary | ICD-10-CM

## 2016-09-28 DIAGNOSIS — K625 Hemorrhage of anus and rectum: Secondary | ICD-10-CM

## 2016-09-28 MED ORDER — HYDROCORTISONE ACETATE 25 MG RE SUPP: 25.0000 mg | Freq: Two times a day (BID) | RECTAL | 1 refills | Status: DC | PRN

## 2016-09-28 NOTE — Progress Notes (Signed)
[facility] Progress Note  Chief Complaint: Abdominal pain  Subjective  History:  46 year old woman following up for the first time since her June 2017 visit. At that time, she had had a cecal episode of rectal bleeding but sometimes felt as if hemorrhoids might be prolapsing. Her gynecologist and recommended she see [place] at that time. I asked her to schedule a colonoscopy, and then after considering it further she unfortunately lost her job and insurance last fall. Therefore she was unable to get it done. She was in the ED 4 weeks ago with an episode of severe bandlike upper abdominal pain that came on subacutely over 8-10 hours. She came home from visiting her parents in [place] for the Easter holiday and went right to the ED. She says she received GI cocktail and some Bentyl and pain resolved after about 24 hours. Some labs were done, no imaging. She still has some intermittent rectal bleeding with blood on the paper and which she feels are prolapsing hemorrhoids. She is not sure she can pursue endoscopic tests right now because she has a very high deductible on to her current health plan.  ROS: Cardiovascular:  no chest pain Respiratory: no dyspnea  The patient's Past Medical, Family and Social History were reviewed and are on file in the EMR.  Objective:  Med list reviewed  Vital signs in last 24 hrs: Vitals:   09/28/16 1601  BP: 136/62  Pulse: 84    Physical Exam    HEENT: sclera anicteric, oral mucosa moist without lesions  Neck: supple, no thyromegaly, JVD or lymphadenopathy  Cardiac: RRR without murmurs, S1S2 heard, no peripheral edema  Pulm: clear to auscultation bilaterally, normal RR and effort noted  Abdomen: soft, Obese, no tenderness, with active bowel sounds. No guarding or palpable hepatosplenomegaly.  Skin; warm and dry, no jaundice or rash  Recent Labs:  CBC Latest Ref Rng & Units 08/30/2016 06/25/2015  WBC 4.0 - 10.5 K/uL 11.0(H) 7.8    Hemoglobin 12.0 - 15.0 g/dL 13.7 12.6  Hematocrit 36.0 - 46.0 % 40.1 37.1  Platelets 150 - 400 K/uL 331 268   CMP Latest Ref Rng & Units 08/30/2016  Glucose 65 - 99 mg/dL 110(H)  BUN 6 - 20 mg/dL 17  Creatinine 0.44 - 1.00 mg/dL 1.02(H)  Sodium 135 - 145 mmol/L 135  Potassium 3.5 - 5.1 mmol/L 3.7  Chloride 101 - 111 mmol/L 103  CO2 22 - 32 mmol/L 22  Calcium 8.9 - 10.3 mg/dL 9.5  Total Protein 6.5 - 8.1 g/dL 7.3  Total Bilirubin 0.3 - 1.2 mg/dL 0.5  Alkaline Phos 38 - 126 U/L 52  AST 15 - 41 U/L 18  ALT 14 - 54 U/L 17     Radiologic studies:    @ASSESSMENTPLANBEGIN @ Assessment: Encounter Diagnoses  Name Primary?  . Abdominal pain, epigastric Yes  . Rectal bleeding     Difficult to know what causes this upper abdominal pain that resolved after 24 hours. She has risk factors for gallstones, so it could've been an episode of biliary colic. I discussed how that can often be felt in the epigastrium. I still feel she should have a colonoscopy to investigate the rectal bleeding and see if she may have hemorrhoids that are amenable to banding. If the ultrasound is normal, EGD is worth pursuing as well to look for other sources of the severe upper abdominal pain. Again, she does not feel financially ready to pursue that. She may be  getting a different full-time job soon with different benefits and thinks might change. She also knows that she is about to get a large bill from the ED visit.  Plan: Right upper quadrant ultrasound Further plan to follow   Total time 30 minutes, over half spent in counseling and coordination of care.   Nelida Meuse III

## 2016-09-28 NOTE — Patient Instructions (Signed)
If you are age 45 or older, your body mass index should be between 23-30. Your Body mass index is 35.07 kg/m. If this is out of the aforementioned range listed, please consider follow up with your Primary Care Provider.  If you are age 36 or younger, your body mass index should be between 19-25. Your Body mass index is 35.07 kg/m. If this is out of the aformentioned range listed, please consider follow up with your Primary Care Provider.   You have been scheduled for an abdominal ultrasound at [facility] (1st floor of hospital) on 10-02-2016 at 930am. Please arrive 15 minutes prior to your appointment for registration. Make certain not to have anything to eat or drink 6 hours prior to your appointment. Should you need to reschedule your appointment, please contact radiology at [phone]. This test typically takes about 30 minutes to perform.  Thank you for choosing [facility]  Dr Wilfrid Lund III

## 2016-10-02 ENCOUNTER — Ambulatory Visit (HOSPITAL_COMMUNITY)
Admission: RE | Admit: 2016-10-02 | Discharge: 2016-10-02 | Disposition: A | Discharge status: Home / Self Care | Payer: BLUE CROSS/BLUE SHIELD | Source: Ambulatory Visit | Attending: Gastroenterology | Admitting: Gastroenterology

## 2016-10-02 ENCOUNTER — Ambulatory Visit (HOSPITAL_COMMUNITY): Payer: BLUE CROSS/BLUE SHIELD

## 2016-10-02 DIAGNOSIS — R1013 Epigastric pain: Secondary | ICD-10-CM | POA: Diagnosis not present

## 2016-10-02 DIAGNOSIS — K625 Hemorrhage of anus and rectum: Secondary | ICD-10-CM | POA: Diagnosis present

## 2016-10-03 ENCOUNTER — Telehealth: Payer: Self-pay | Admitting: Internal Medicine

## 2016-10-03 NOTE — Telephone Encounter (Signed)
No pain yesterday when had [place] yesterday Pain started today -- band-like pain. Had similar pain in April that took her to the ED.  That episode was associated with a rash.  Starts at belly button and moves up.  Dull-type pain.  Yesterday she again saw a rash from finger-tips to elbows.  And today the pain started.  Rates pain 5/10.  + Nausea, no vomiting.  Appetite is decreased. No heartburn No constipation or diarrhea.    Asking for bentyl and GI cocktail.    I spoke with the pharmacist at [facility] and she will compound a GI cocktail for her. In the ED on 08/30/2016 she received GI cocktail consisting of Maalox, Lidocaine and Donnatal; thus I have prescribed the same 3 medications in compound form to be used TIDPRN.  Enough given for 1 week. Given that Donnatal contains hyoscyamine, I did not Rx Bentyl.  I will alert Dr. Loletha Carrow and he can pick up with the plan on Monday.  I did review the results with her of the recent right upper quadrant ultrasound which showed gallbladder sludge and fatty liver

## 2016-10-05 ENCOUNTER — Telehealth: Payer: Self-pay | Admitting: Gastroenterology

## 2016-10-05 NOTE — Telephone Encounter (Signed)
Patient called checking on her [place] results. Let her know that Dr. Loletha Carrow has been doing procedures today and as soon as he's looked at the result we will contact her. She did mention that Dr. Hilarie Fredrickson reviewed the test with her.

## 2017-04-21 ENCOUNTER — Telehealth: Payer: Self-pay | Admitting: Physician Assistant

## 2017-04-24 ENCOUNTER — Other Ambulatory Visit: Payer: Self-pay | Admitting: Physician Assistant

## 2017-04-26 ENCOUNTER — Telehealth: Payer: Self-pay

## 2017-04-26 NOTE — Telephone Encounter (Signed)
Patient is feeling better. She would like a referral sent to [facility]. Her insurance will be changing in January and she will only be able to see [facility] doctors only. I told her that if she cannot be seen in next couple of weeks to let us know and would then refer her onto [facility], but for now she wants referral sent to [facility].

## 2017-04-26 NOTE — Telephone Encounter (Signed)
-----   Message from Alfredia Ferguson, PA-C sent at 04/21/2017  3:45 PM EST ----- Pt calling with another bad attack of RUQ /epigastric pain like she had in May - wanted GI cocktail called in to pharmacy in [place]  Which  I did- she needs a [facility] HIDA scan by previous notes . Please call her Monday , see how she is feeling and schedule  If appropriate- thanks

## 2017-04-26 NOTE — Telephone Encounter (Signed)
Referral sent to [facility] for consult to remove gallbladder. Patient does want to have referral sent to [facility], even though her insurance is changing on June 01, 2017 and will be [facility] doctors exclusively. I let her know that if she cannot see the doctor at [facility] and have surgery before end of year to let us know and can refer her elsewhere.

## 2017-04-26 NOTE — Telephone Encounter (Signed)
Holly Leach,    Please call her this AM and see how she is feeling.    This still sounds like a probable gallbladder attack.    I no longer feel that a HIDA scan is going to be helpful.  Even if that test does not happen to reproduce her symptoms, the symptoms still sound very much like gallbladder.    If she is agreeable, I would like to refer her to general surgery, hopefully to be seen in next couple weeks.

## 2017-04-30 ENCOUNTER — Telehealth: Payer: Self-pay

## 2017-04-30 NOTE — Telephone Encounter (Signed)
Patient is scheduled to see Dr. Kae Heller on 05/05/17 at 3:45.

## 2018-01-21 ENCOUNTER — Other Ambulatory Visit: Payer: Self-pay | Admitting: Obstetrics and Gynecology

## 2018-01-21 DIAGNOSIS — N6310 Unspecified lump in the right breast, unspecified quadrant: Secondary | ICD-10-CM

## 2018-01-21 DIAGNOSIS — R928 Other abnormal and inconclusive findings on diagnostic imaging of breast: Secondary | ICD-10-CM

## 2018-01-26 ENCOUNTER — Ambulatory Visit
Admission: RE | Admit: 2018-01-26 | Discharge: 2018-01-26 | Disposition: A | Discharge status: Home / Self Care | Payer: BLUE CROSS/BLUE SHIELD | Source: Ambulatory Visit | Attending: Obstetrics and Gynecology | Admitting: Obstetrics and Gynecology

## 2018-01-26 ENCOUNTER — Other Ambulatory Visit: Payer: Self-pay | Admitting: Obstetrics and Gynecology

## 2018-01-26 ENCOUNTER — Ambulatory Visit
Admission: RE | Admit: 2018-01-26 | Discharge: 2018-01-26 | Disposition: A | Discharge status: Home / Self Care | Payer: PRIVATE HEALTH INSURANCE | Source: Ambulatory Visit | Attending: Obstetrics and Gynecology | Admitting: Obstetrics and Gynecology

## 2018-01-26 DIAGNOSIS — R928 Other abnormal and inconclusive findings on diagnostic imaging of breast: Secondary | ICD-10-CM

## 2018-01-26 DIAGNOSIS — N6310 Unspecified lump in the right breast, unspecified quadrant: Secondary | ICD-10-CM

## 2018-01-27 ENCOUNTER — Inpatient Hospital Stay: Admission: RE | Admit: 2018-01-27 | Payer: PRIVATE HEALTH INSURANCE | Source: Ambulatory Visit

## 2018-02-02 ENCOUNTER — Ambulatory Visit
Admission: RE | Admit: 2018-02-02 | Discharge: 2018-02-02 | Disposition: A | Discharge status: Home / Self Care | Payer: PRIVATE HEALTH INSURANCE | Source: Ambulatory Visit | Attending: Obstetrics and Gynecology | Admitting: Obstetrics and Gynecology

## 2018-02-02 ENCOUNTER — Other Ambulatory Visit: Payer: Self-pay | Admitting: Obstetrics and Gynecology

## 2018-02-02 DIAGNOSIS — N6310 Unspecified lump in the right breast, unspecified quadrant: Secondary | ICD-10-CM

## 2018-02-07 ENCOUNTER — Other Ambulatory Visit: Payer: PRIVATE HEALTH INSURANCE

## 2018-07-06 ENCOUNTER — Other Ambulatory Visit: Payer: Self-pay | Admitting: Family Medicine

## 2018-07-06 DIAGNOSIS — R5381 Other malaise: Secondary | ICD-10-CM

## 2018-07-06 DIAGNOSIS — K8000 Calculus of gallbladder with acute cholecystitis without obstruction: Secondary | ICD-10-CM

## 2018-07-06 DIAGNOSIS — K819 Cholecystitis, unspecified: Secondary | ICD-10-CM

## 2018-07-07 ENCOUNTER — Ambulatory Visit
Admission: RE | Admit: 2018-07-07 | Discharge: 2018-07-07 | Disposition: A | Discharge status: Home / Self Care | Payer: PRIVATE HEALTH INSURANCE | Source: Ambulatory Visit | Attending: Family Medicine | Admitting: Family Medicine

## 2018-07-07 DIAGNOSIS — K819 Cholecystitis, unspecified: Secondary | ICD-10-CM

## 2018-09-28 ENCOUNTER — Ambulatory Visit (INDEPENDENT_AMBULATORY_CARE_PROVIDER_SITE_OTHER): Payer: 59

## 2018-09-28 ENCOUNTER — Other Ambulatory Visit: Payer: Self-pay

## 2018-09-28 ENCOUNTER — Encounter: Payer: Self-pay | Admitting: Emergency Medicine

## 2018-09-28 ENCOUNTER — Ambulatory Visit
Admission: EM | Admit: 2018-09-28 | Discharge: 2018-09-28 | Disposition: A | Discharge status: Home / Self Care | Payer: 59

## 2018-09-28 DIAGNOSIS — S92325A Nondisplaced fracture of second metatarsal bone, left foot, initial encounter for closed fracture: Secondary | ICD-10-CM | POA: Diagnosis not present

## 2018-09-28 DIAGNOSIS — W109XXA Fall (on) (from) unspecified stairs and steps, initial encounter: Secondary | ICD-10-CM

## 2018-09-28 MED ORDER — NAPROXEN 500 MG PO TABS: 500.0000 mg | ORAL_TABLET | Freq: Two times a day (BID) | ORAL | 0 refills | Status: DC

## 2018-09-28 MED ORDER — TRAMADOL HCL 50 MG PO TABS: 50.0000 mg | ORAL_TABLET | Freq: Two times a day (BID) | ORAL | 0 refills | Status: AC | PRN

## 2018-09-28 NOTE — Discharge Instructions (Addendum)
We are placing you in a cam walker and given use of crutches use to help with bearing weight. I am sending some naproxen to the pharmacy to help with pain and inflammation You can take tramadol as needed for the next couple days for more severe pain Make sure that you are resting, icing and elevating the foot as much as possible You want to follow-up with orthopedic for further evaluation and management and repeat x-ray in the next 10 to 14 days

## 2018-09-28 NOTE — ED Notes (Signed)
Patient able to ambulate independently  

## 2018-09-28 NOTE — ED Provider Notes (Signed)
[facility]    CSN: [id number] Arrival date & time: 09/28/18  1328     History   Chief Complaint Chief Complaint  Patient presents with  . Leg Pain    HPI Holly Leach is a 48 y.o. female.   Patient is a 48 year old female the presents today with leg pain, ankle pain and foot pain after a fall.  This occurred earlier today.  She was walking down the stairs and missed a step tripping and falling.  Her worst injury is to the left foot and ankle.  She is able to ambulate some but having a lot of pain ambulating on the left foot.  There is some mild swelling.  She has some abrasions to the right leg but denies any pain in that leg.  No numbness, tingling or loss of sensation.  She took Goody's powder earlier for headache but has not taken anything since for the pain due to the fall.  She denies hitting her head during the fall or any loss of consciousness.  ROS per HPI      Past Medical History:  Diagnosis Date  . Chronic headaches   . Hypertension   . PE (pulmonary embolism) 2006    Patient Active Problem List   Diagnosis Date Noted  . Neck mass 11/03/2013  . 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/03/2013    History reviewed. No pertinent surgical history.  OB History   No obstetric history on file.      Home Medications    Prior to Admission medications   Medication Sig Start Date End Date Taking? Authorizing Provider  buPROPion (WELLBUTRIN XL) 300 MG 24 hr tablet Take 300 mg by mouth daily.   Yes [provider]  levothyroxine (SYNTHROID) 125 MCG tablet Take 125 mcg by mouth daily before breakfast.   Yes [provider]  hydrocortisone (ANUSOL-HC) 25 MG suppository Place 1 suppository (25 mg total) rectally 2 (two) times daily as needed for hemorrhoids or itching. 09/28/16   Nelida Meuse III, MD  hyoscyamine (LEVSIN SL) 0.125 MG SL tablet DISSOLVE 1 TABLET UNDER THE TONGUE EVERY 4 TO 6 HOURS AS NEEDED FOR ABDOMINAL PAIN 04/26/17   Doran Stabler,  MD  lisinopril-hydrochlorothiazide (PRINZIDE,ZESTORETIC) 10-12.5 MG per tablet Take 1 tablet by mouth daily.    [provider]  loratadine (CLARITIN) 10 MG tablet Take 10 mg by mouth daily.    [provider]  Melatonin 3 MG CAPS Take by mouth.    [provider]  naproxen (NAPROSYN) 500 MG tablet Take 1 tablet (500 mg total) by mouth 2 (two) times daily. 09/28/18   Loura Halt A, NP  norethindrone-ethinyl estradiol (MICROGESTIN,JUNEL,LOESTRIN) 1-20 MG-MCG tablet Take 1 tablet by mouth daily.    [provider]  sucralfate (CARAFATE) 1 GM/10ML suspension Take 10 mLs (1 g total) by mouth 4 (four) times daily -  with meals and at bedtime. 08/30/16   Leo Grosser, MD  traMADol (ULTRAM) 50 MG tablet Take 1 tablet (50 mg total) by mouth every 12 (twelve) hours as needed for up to 3 days. 09/28/18 10/01/18  Orvan July, NP    Family History Family History  Problem Relation Age of Onset  . Hypertension Mother   . Hypertension Father   . Heart disease Father   . Hypertension Brother   . Hyperlipidemia Brother   . Diabetes Paternal Grandmother   . Irritable bowel syndrome Sister   . Irritable bowel syndrome Maternal Aunt  Social History Social History   Tobacco Use  . Smoking status: Never Smoker  . Smokeless tobacco: Never Used  Substance Use Topics  . Alcohol use: No    Alcohol/week: 0.0 standard drinks  . Drug use: No     Allergies   Augmentin [amoxicillin-pot clavulanate] and No known allergies   Review of Systems Review of Systems   Physical Exam Triage Vital Signs ED Triage Vitals  Enc Vitals Group     BP 09/28/18 1343 122/68     Pulse Rate 09/28/18 1343 95     Resp 09/28/18 1343 18     Temp 09/28/18 1343 98.1 F (36.7 C)     Temp Source 09/28/18 1343 Oral     SpO2 09/28/18 1343 97 %     Weight --      Height --      Head Circumference --      Peak Flow --      Pain Score 09/28/18 1338 7     Pain Loc --      Pain Edu? --       Excl. in [place]? --    No data found.  Updated Vital Signs BP 122/68 (BP Location: Left Arm)   Pulse 95   Temp 98.1 F (36.7 C) (Oral)   Resp 18   SpO2 97%   Visual Acuity Right Eye Distance:   Left Eye Distance:   Bilateral Distance:    Right Eye Near:   Left Eye Near:    Bilateral Near:     Physical Exam Musculoskeletal:     Left ankle: She exhibits normal pulse. Tenderness. Lateral malleolus tenderness found.       Feet:     Comments: Tender to lateral malleolus and midfoot of the left foot.  Midfoot with mild swelling and bruising Decreased range of motion of foot and ankle due to pain.  No obvious deformities  Skin:    Findings: Abrasion present.          Comments: Abrasions to the right upper and lower leg.       UC Treatments / Results  Labs (all labs ordered are listed, but only abnormal results are displayed) Labs Reviewed - No data to display  EKG None  Radiology Dg Ankle Complete Left  Result Date: 09/28/2018 CLINICAL DATA:  48 year old female with left foot and ankle pain EXAM: LEFT ANKLE COMPLETE - 3+ VIEW COMPARISON:  None. FINDINGS: Circumferential soft tissue swelling of the lower leg and ankle, nonspecific. Ankle mortise congruent. No acute displaced fracture. No joint effusion. Degenerative changes of the hindfoot. IMPRESSION: Negative for acute bony abnormality Electronically Signed   By: Corrie Mckusick D.O.   On: 09/28/2018 14:10   Dg Foot Complete Left  Result Date: 09/28/2018 CLINICAL DATA:  Left foot ankle pain after fall down steps today. EXAM: LEFT FOOT - COMPLETE 3+ VIEW COMPARISON:  None. FINDINGS: Mild focal cortical regularity along the medial aspect of the base of the second metatarsal seen only on the AP view as this could represent a fracture. Small inferior calcaneal spur. Remainder of the exam is unremarkable. IMPRESSION: Possible fracture at the base of the second metatarsal. Recommend clinical correlation and follow-up  radiographs 10-14 days may be helpful. Electronically Signed   By: Marin Olp M.D.   On: 09/28/2018 14:11    Procedures Procedures (including critical care time)  Medications Ordered in UC Medications - No data to display  Initial Impression / Assessment and Plan /  UC Course  I have reviewed the triage vital signs and the nursing notes.  Pertinent labs & imaging results that were available during my care of the patient were reviewed by me and considered in my medical decision making (see chart for details).     X-ray revealed closed nondisplaced fracture of the second metatarsal at the base. X-ray of the ankle was normal Recommended follow-up in 10 to 14 days for recheck Placing patient in cam walker and given crutches until follow-up with orthopedic Naproxen for pain and inflammation and tramadol for more severe pain Instructed to rest, ice and elevate Follow up as needed for continued or worsening symptoms  Final Clinical Impressions(s) / UC Diagnoses   Final diagnoses:  Closed nondisplaced fracture of second metatarsal bone of left foot, initial encounter     Discharge Instructions     We are placing you in a cam walker and given use of crutches use to help with bearing weight. I am sending some naproxen to the pharmacy to help with pain and inflammation You can take tramadol as needed for the next couple days for more severe pain Make sure that you are resting, icing and elevating the foot as much as possible You want to follow-up with orthopedic for further evaluation and management and repeat x-ray in the next 10 to 14 days    ED Prescriptions    Medication Sig Dispense Auth. Provider   naproxen (NAPROSYN) 500 MG tablet Take 1 tablet (500 mg total) by mouth 2 (two) times daily. 30 tablet Nahomi Hegner A, NP   traMADol (ULTRAM) 50 MG tablet Take 1 tablet (50 mg total) by mouth every 12 (twelve) hours as needed for up to 3 days. 6 tablet Loura Halt A, NP      Controlled Substance Prescriptions [place] Controlled Substance Registry consulted? Not Applicable   Orvan July, NP 09/28/18 1453

## 2018-09-28 NOTE — ED Triage Notes (Signed)
Pt presents to [facility] for assessment after a fall down steps.  Denies head injury, denies LOC.  C/o left foot and ankle pain.

## 2018-10-07 ENCOUNTER — Other Ambulatory Visit: Payer: Self-pay | Admitting: Orthopedic Surgery

## 2018-10-07 DIAGNOSIS — M79672 Pain in left foot: Secondary | ICD-10-CM

## 2018-10-10 ENCOUNTER — Ambulatory Visit
Admission: RE | Admit: 2018-10-10 | Discharge: 2018-10-10 | Disposition: A | Discharge status: Home / Self Care | Payer: 59 | Source: Ambulatory Visit | Attending: Orthopedic Surgery | Admitting: Orthopedic Surgery

## 2018-10-10 DIAGNOSIS — M79672 Pain in left foot: Secondary | ICD-10-CM

## 2020-01-15 ENCOUNTER — Other Ambulatory Visit: Payer: Self-pay | Admitting: Family Medicine

## 2020-01-15 DIAGNOSIS — E041 Nontoxic single thyroid nodule: Secondary | ICD-10-CM

## 2020-01-23 ENCOUNTER — Ambulatory Visit
Admission: RE | Admit: 2020-01-23 | Discharge: 2020-01-23 | Disposition: A | Discharge status: Home / Self Care | Payer: 59 | Source: Ambulatory Visit | Attending: Family Medicine | Admitting: Family Medicine

## 2020-01-23 DIAGNOSIS — E041 Nontoxic single thyroid nodule: Secondary | ICD-10-CM

## 2020-02-22 ENCOUNTER — Ambulatory Visit: Payer: Self-pay | Admitting: General Surgery

## 2020-02-22 NOTE — H&P (Signed)
Valeda Malm Appointment: 02/22/2020 4:00 PM Location: [facility] Patient #: [id number] DOB: [birth year] Married / Language: Cleophus Molt / Race: White Female  History of Present Illness Randall Hiss M. Aarushi Hemric MD; 02/22/2020 4:54 PM) The patient is a 49 year old female who presents with an umbilical hernia. She is referred by Dr Darron Doom for evaluation of an abdominal hernia. She states that she has had periumbilical pain since 6599. It was so intense the first time that prompted her to go to the emergency room where they gave her a GI cocktail. She then was referred to [facility] and they thought it may be her gallbladder and she had an abdominal ultrasound that showed sludge. She continued to have intermittent episodes of periumbilical pain radiating up into her upper midline. She ended up establishing care with a different primary care physician. She had another episode in February 2020 which prompted a scan which was negative she states. She had another episode in May 2021 which also resulted in a scan. She states initially the episodes were maybe a few times a year but they're becoming more frequent. That'll start at her belly button and radiate upward. She make a little bit bloated and gassy with it. She denies any prior abdominal surgery. Her PCP gave her some meloxicam which has helped when she does have the flares. She had a CT scan at an outside institution. I was able to review the report I do not have access it imaging. She had a fluid-containing periumbilical hernia but no bowel. She does not smoke. She denies any chest pain, chest pressure, source of breath, dyspnea on exertion. She works in [employer].   Problem List/Past Medical Randall Hiss M. Redmond Pulling, MD; 02/22/2020 4:54 PM) OBESITY (BMI 35-70.1) (X79.3) UMBILICAL HERNIA WITHOUT OBSTRUCTION OR GANGRENE (K42.9)  Past Surgical History (Chanel Teressa Senter, [place]; 02/22/2020 3:57 PM) Breast Biopsy Right.  Diagnostic Studies History  (Chanel Teressa Senter, [place]; 02/22/2020 3:57 PM) Colonoscopy never Mammogram within last year Pap Smear 1-5 years ago  Allergies (Chanel Teressa Senter, CMA; 02/22/2020 3:58 PM) Augmentin *PENICILLINS* Allergies Reconciled  Medication History (Chanel Teressa Senter, CMA; 02/22/2020 3:59 PM) Levothyroxine Sodium (125MCG Tablet, Oral) Active. Lisinopril-hydroCHLOROthiazide (20-12.5MG  Tablet, Oral) Active. Melatonin (3MG  Capsule, Oral) Active. Vienva (0.1-20MG -MCG Tablet, Oral) Active. Medications Reconciled  Social History Antonietta Jewel, CMA; 02/22/2020 3:57 PM) Caffeine use Coffee, Tea. No alcohol use No drug use Tobacco use Never smoker.  Family History Antonietta Jewel, [place]; 02/22/2020 3:57 PM) Arthritis Sister. Hypertension Brother, Father.  Pregnancy / Birth History Antonietta Jewel, [place]; 02/22/2020 3:57 PM) Age at menarche 76 years. Contraceptive History Oral contraceptives. Gravida 2 Length (months) of breastfeeding 3-6 Maternal age 80-25 Para 3 Regular periods  Other Problems Randall Hiss M. Redmond Pulling, MD; 02/22/2020 4:54 PM) Hemorrhoids High blood pressure Hypercholesterolemia Migraine Headache Thyroid Disease Umbilical Hernia Repair     Review of Systems (Chanel Nolan CMA; 02/22/2020 3:57 PM) General Present- Fatigue. Not Present- Appetite Loss, Chills, Fever, Night Sweats, Weight Gain and Weight Loss. Skin Not Present- Change in Wart/Mole, Dryness, Hives, Jaundice, New Lesions, Non-Healing Wounds, Rash and Ulcer. HEENT Present- Seasonal Allergies and Wears glasses/contact lenses. Not Present- Earache, Hearing Loss, Hoarseness, Nose Bleed, Oral Ulcers, Ringing in the Ears, Sinus Pain, Sore Throat, Visual Disturbances and Yellow Eyes. Respiratory Not Present- Bloody sputum, Chronic Cough, Difficulty Breathing, Snoring and Wheezing. Breast Not Present- Breast Mass, Breast Pain, Nipple Discharge and Skin Changes. Cardiovascular Not Present- Chest Pain, Difficulty Breathing Lying  Down, Leg Cramps, Palpitations, Rapid Heart Rate, Shortness of Breath and Swelling  of Extremities. Gastrointestinal Present- Hemorrhoids. Not Present- Abdominal Pain, Bloating, Bloody Stool, Change in Bowel Habits, Chronic diarrhea, Constipation, Difficulty Swallowing, Excessive gas, Gets full quickly at meals, Indigestion, Nausea, Rectal Pain and Vomiting. Female Genitourinary Not Present- Frequency, Nocturia, Painful Urination, Pelvic Pain and Urgency. Musculoskeletal Not Present- Back Pain, Joint Pain, Joint Stiffness, Muscle Pain, Muscle Weakness and Swelling of Extremities. Neurological Not Present- Decreased Memory, Fainting, Headaches, Numbness, Seizures, Tingling, Tremor, Trouble walking and Weakness. Psychiatric Not Present- Anxiety, Bipolar, Change in Sleep Pattern, Depression, Fearful and Frequent crying. Endocrine Present- Cold Intolerance. Not Present- Excessive Hunger, Hair Changes, Heat Intolerance, Hot flashes and New Diabetes. Hematology Not Present- Blood Thinners, Easy Bruising, Excessive bleeding, Gland problems, HIV and Persistent Infections.  Vitals (Chanel Nolan CMA; 02/22/2020 3:59 PM) 02/22/2020 3:59 PM Weight: 216.38 lb Height: 63in Body Surface Area: 2 m Body Mass Index: 38.33 kg/m  Temp.: 98.73F  Pulse: 98 (Regular)  BP: 132/82(Sitting, Right Arm, Standard)        Physical Exam Randall Hiss M. Shaida Route MD; 02/22/2020 4:51 PM)  General Mental Status-Alert. General Appearance-Consistent with stated age. Hydration-Well hydrated. Voice-Normal.  Head and Neck Head-normocephalic, atraumatic with no lesions or palpable masses. Trachea-midline. Thyroid Gland Characteristics - normal size and consistency.  Eye Eyeball - Bilateral-Extraocular movements intact. Sclera/Conjunctiva - Bilateral-No scleral icterus.  Chest and Lung Exam Chest and lung exam reveals -quiet, even and easy respiratory effort with no use of accessory muscles and on  auscultation, normal breath sounds, no adventitious sounds and normal vocal resonance. Inspection Chest Wall - Normal. Back - normal.  Breast - Did not examine.  Cardiovascular Cardiovascular examination reveals -normal heart sounds, regular rate and rhythm with no murmurs and normal pedal pulses bilaterally.  Abdomen Inspection  Inspection of the abdomen reveals: Note: bulge at base of umblilicus. soft, defect size hard to determine based on body habitus. mostly reducible. defect seems less than 2cm, no other abdominal fascial defect. Skin - Scar - no surgical scars. Palpation/Percussion Palpation and Percussion of the abdomen reveal - Soft, Non Tender, No Rebound tenderness, No Rigidity (guarding) and No hepatosplenomegaly. Auscultation Auscultation of the abdomen reveals - Bowel sounds normal.  Peripheral Vascular Upper Extremity Palpation - Pulses bilaterally normal.  Neurologic Neurologic evaluation reveals -alert and oriented x 3 with no impairment of recent or remote memory. Mental Status-Normal.  Neuropsychiatric The patient's mood and affect are described as -normal. Judgment and Insight-insight is appropriate concerning matters relevant to self.  Musculoskeletal Normal Exam - Left-Upper Extremity Strength Normal and Lower Extremity Strength Normal. Normal Exam - Right-Upper Extremity Strength Normal and Lower Extremity Strength Normal.  Lymphatic Head & Neck  General Head & Neck Lymphatics: Bilateral - Description - Normal. Axillary - Did not examine. Femoral & Inguinal - Did not examine.    Assessment & Plan Randall Hiss M. Zedrick Springsteen MD; 9/83/3825 0:53 PM)  UMBILICAL HERNIA WITHOUT OBSTRUCTION OR GANGRENE (K42.9) Impression: We discussed the etiology of umbilical hernias. We discussed the signs and symptoms of incarceration and strangulation. The patient was given educational material. I also drew diagrams. I think her pain is due to her umbilical  hernia.  We discussed nonoperative and operative management. With respect to operative management, we discussed open repair, laparoscopic assisted as well as robotic repair. With respect to laparoscopic and robotic approaches we discussed primary muscle repair with mesh underlay. We discussed how all approaches differ and pros and cons of each.  We discussed the risk and benefits of surgery including but not limited to bleeding, infection,  injury to surrounding structures, hernia recurrence, mesh complications, hematoma/seroma formation, blood clot formation, urinary retention, post operative ileus, general anesthesia risk, abdominal pain. We discussed the importance of avoiding heavy lifting and straining for a period of 4- 6 weeks. We did discuss postoperative recovery.  The patient has elected to proceed with robotic [place] with MESH.  We did discuss that at her current BMI she is slightly increased risk for recurrence however she is having more frequent symptoms. She wishes to proceed with surgery. We discussed the importance of working on weight loss after surgery  This patient encounter took 32 minutes today to perform the following: take history, perform exam, review outside records, interpret imaging, counsel the patient on their diagnosis and document encounter, findings & plan in the EHR  Current Plans You are being scheduled for surgery- Our schedulers will call you.  You should hear from our office's scheduling department within 5 working days about the location, date, and time of surgery. We try to make accommodations for patient's preferences in scheduling surgery, but sometimes the OR schedule or the surgeon's schedule prevents [place] from making those accommodations.  If you have not heard from our office [phone]) in 5 working days, call the office and ask for your surgeon's nurse.  If you have other questions about your diagnosis, plan, or surgery, call  the office and ask for your surgeon's nurse.  Pt Education - Pamphlet Given - Hernia Surgery: discussed with patient and provided information. Pt Education - CCS Mesh education: discussed with patient and provided information.  OBESITY (BMI 30-39.9) (E66.9)  Leighton Ruff. Redmond Pulling, MD, FACS General, Bariatric, & Minimally Invasive Surgery [facility], [place]

## 2020-04-01 ENCOUNTER — Other Ambulatory Visit (HOSPITAL_COMMUNITY): Payer: 59

## 2020-04-12 ENCOUNTER — Other Ambulatory Visit (HOSPITAL_COMMUNITY): Payer: 59

## 2020-05-01 IMAGING — DX LEFT FOOT - COMPLETE 3+ VIEW
3 series · 3 of 3 positions shown · non-contrast
Comparison: None.

CLINICAL DATA: Left foot ankle pain after fall down steps today.

EXAM:
LEFT FOOT - COMPLETE 3+ VIEW

[foot supine dp]
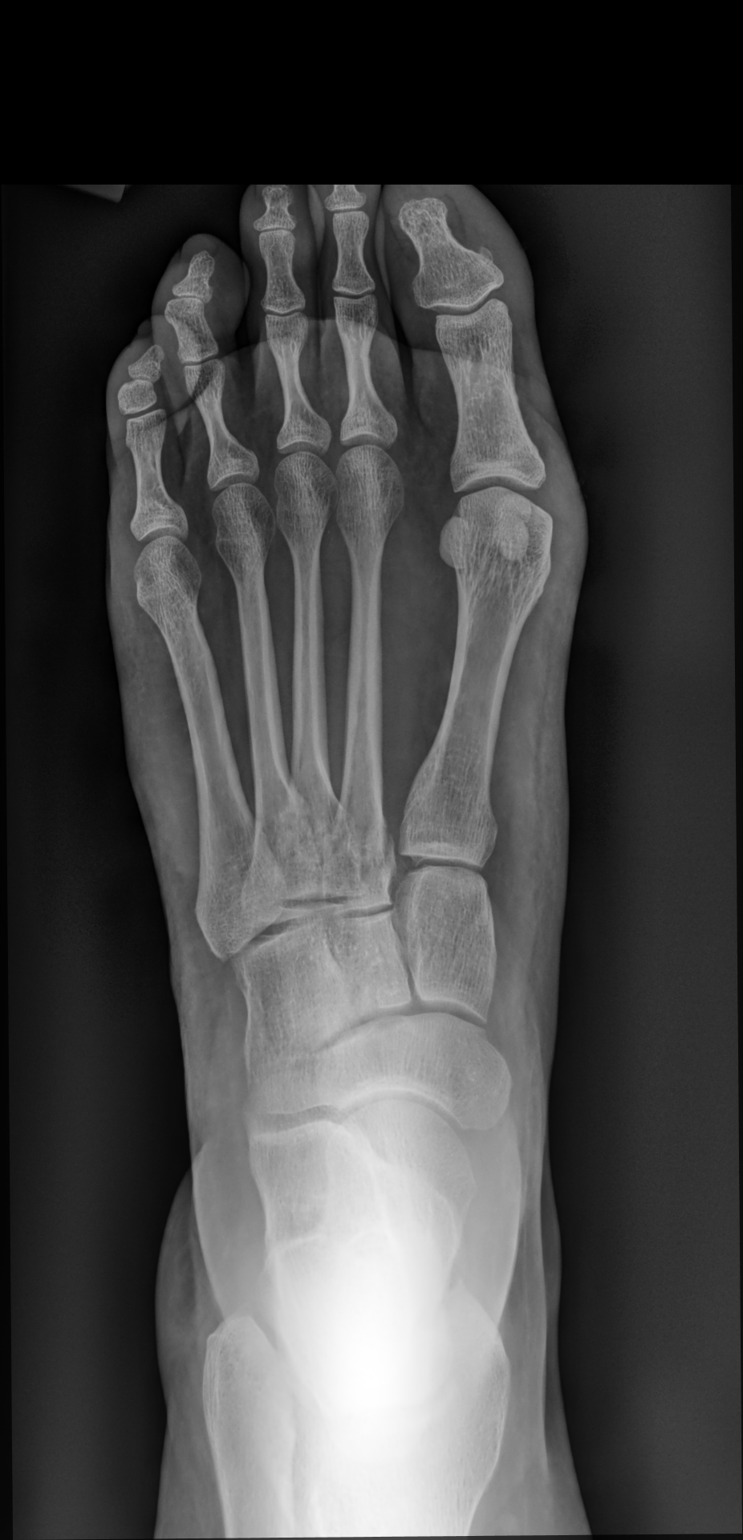

[foot medial oblique]
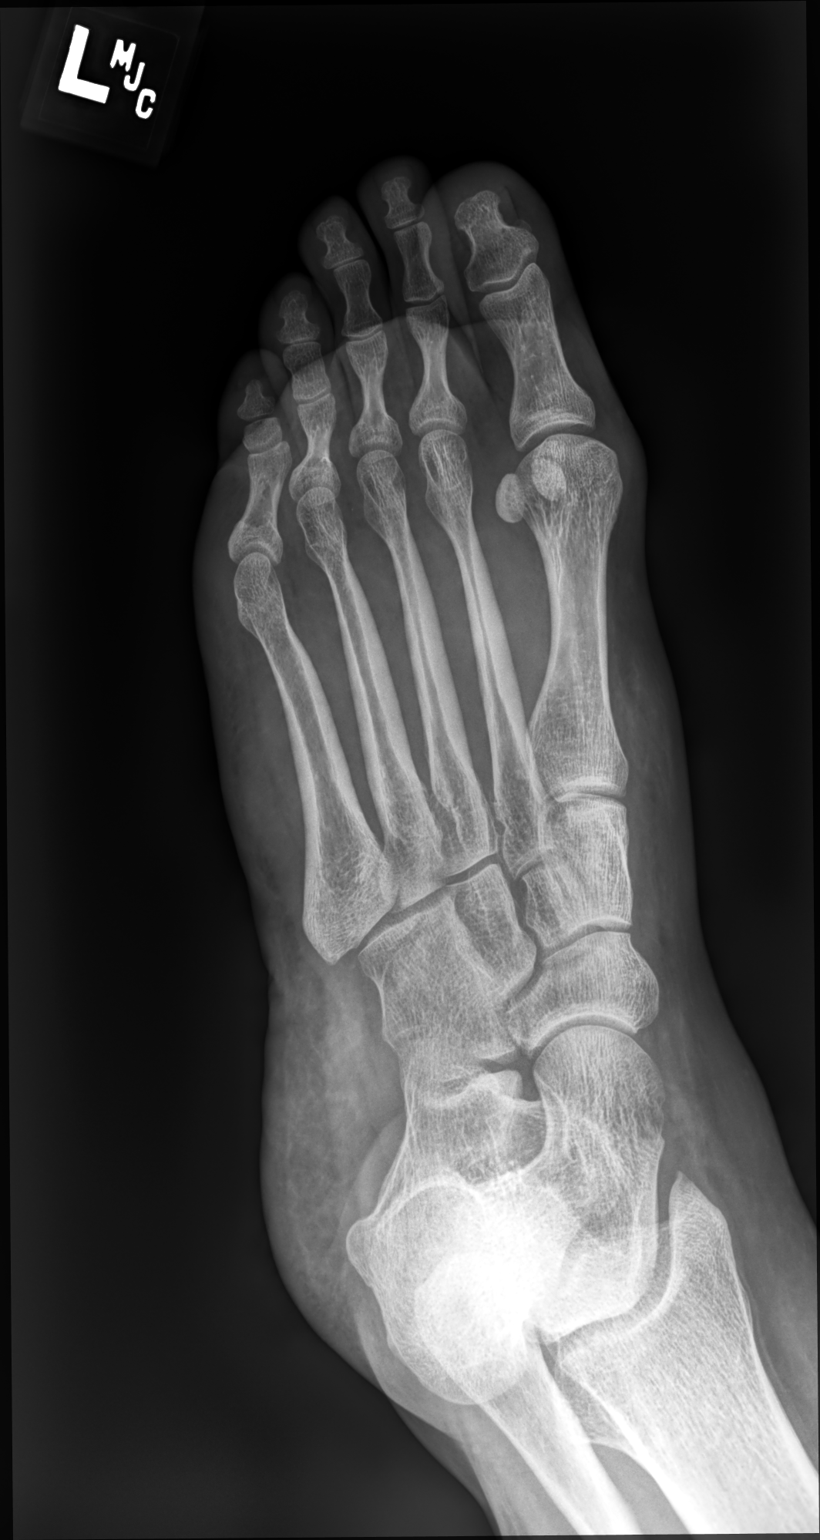

[foot supine lat]
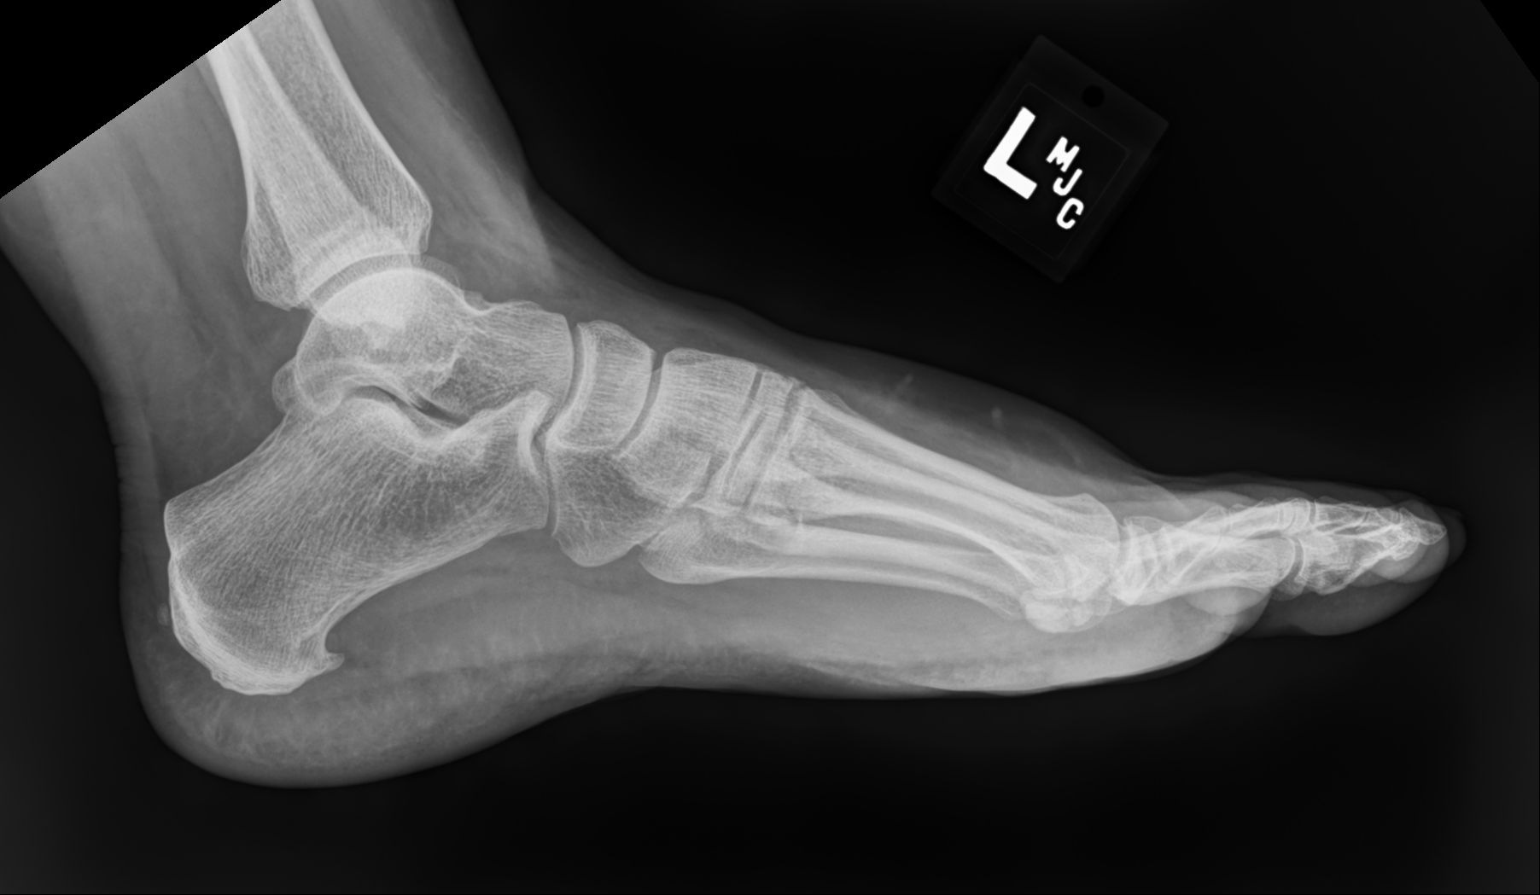

[3 of 3 positions shown; findings below may reference images not displayed]

FINDINGS: Mild focal cortical regularity along the medial aspect of the base
of the second metatarsal seen only on the AP view as this could
represent a fracture. Small inferior calcaneal spur. Remainder of
the exam is unremarkable.
IMPRESSION: Possible fracture at the base of the second metatarsal. Recommend
clinical correlation and follow-up radiographs 10-14 days may be
helpful.

## 2020-05-01 IMAGING — DX LEFT ANKLE COMPLETE - 3+ VIEW
3 series · 3 of 3 positions shown · non-contrast
Comparison: None.

CLINICAL DATA: 48-year-old female with left foot and ankle pain

EXAM:
LEFT ANKLE COMPLETE - 3+ VIEW

[ankle ap]
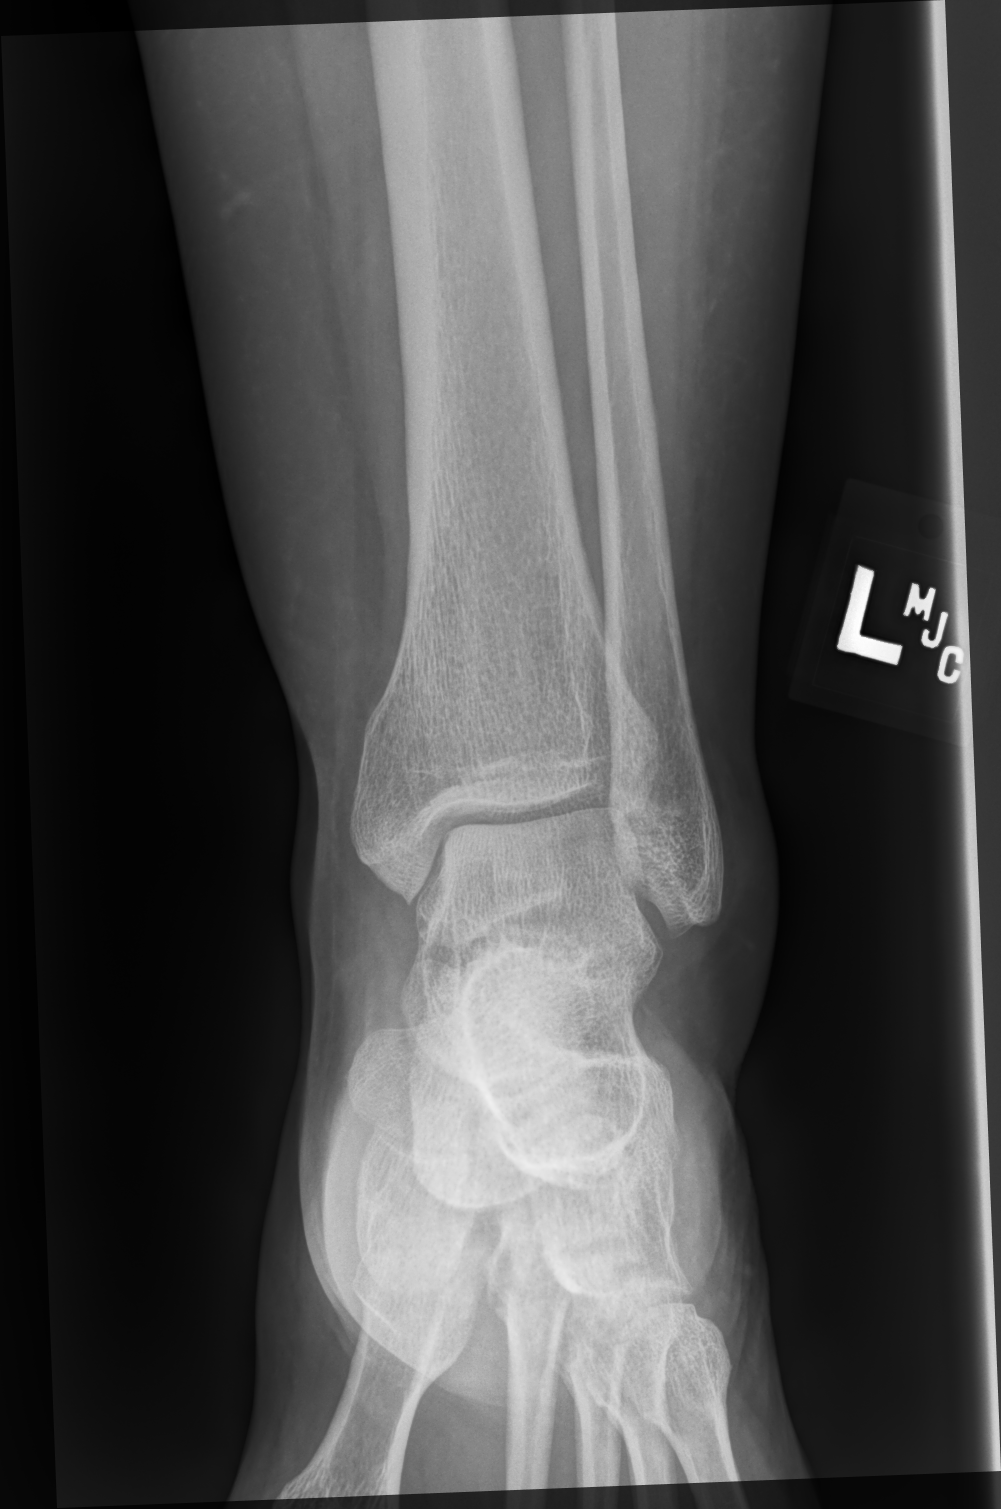

[ankle medial oblique]
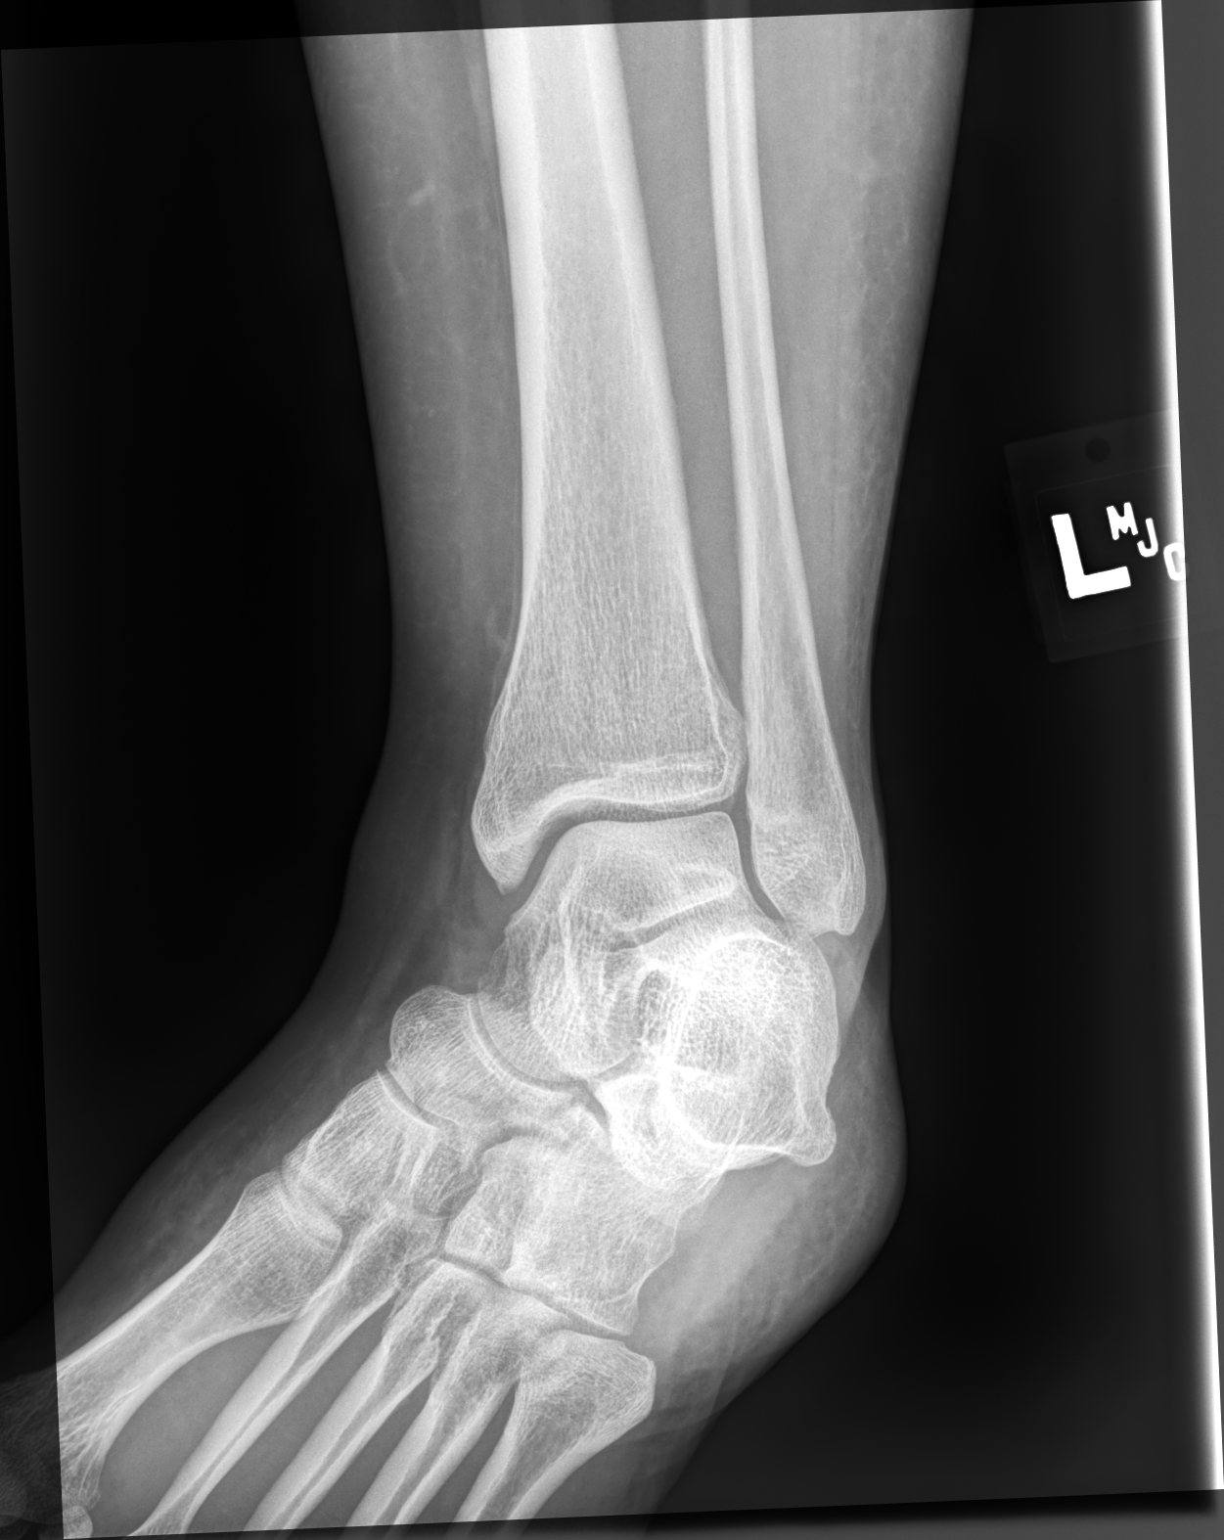

[ankle lat]
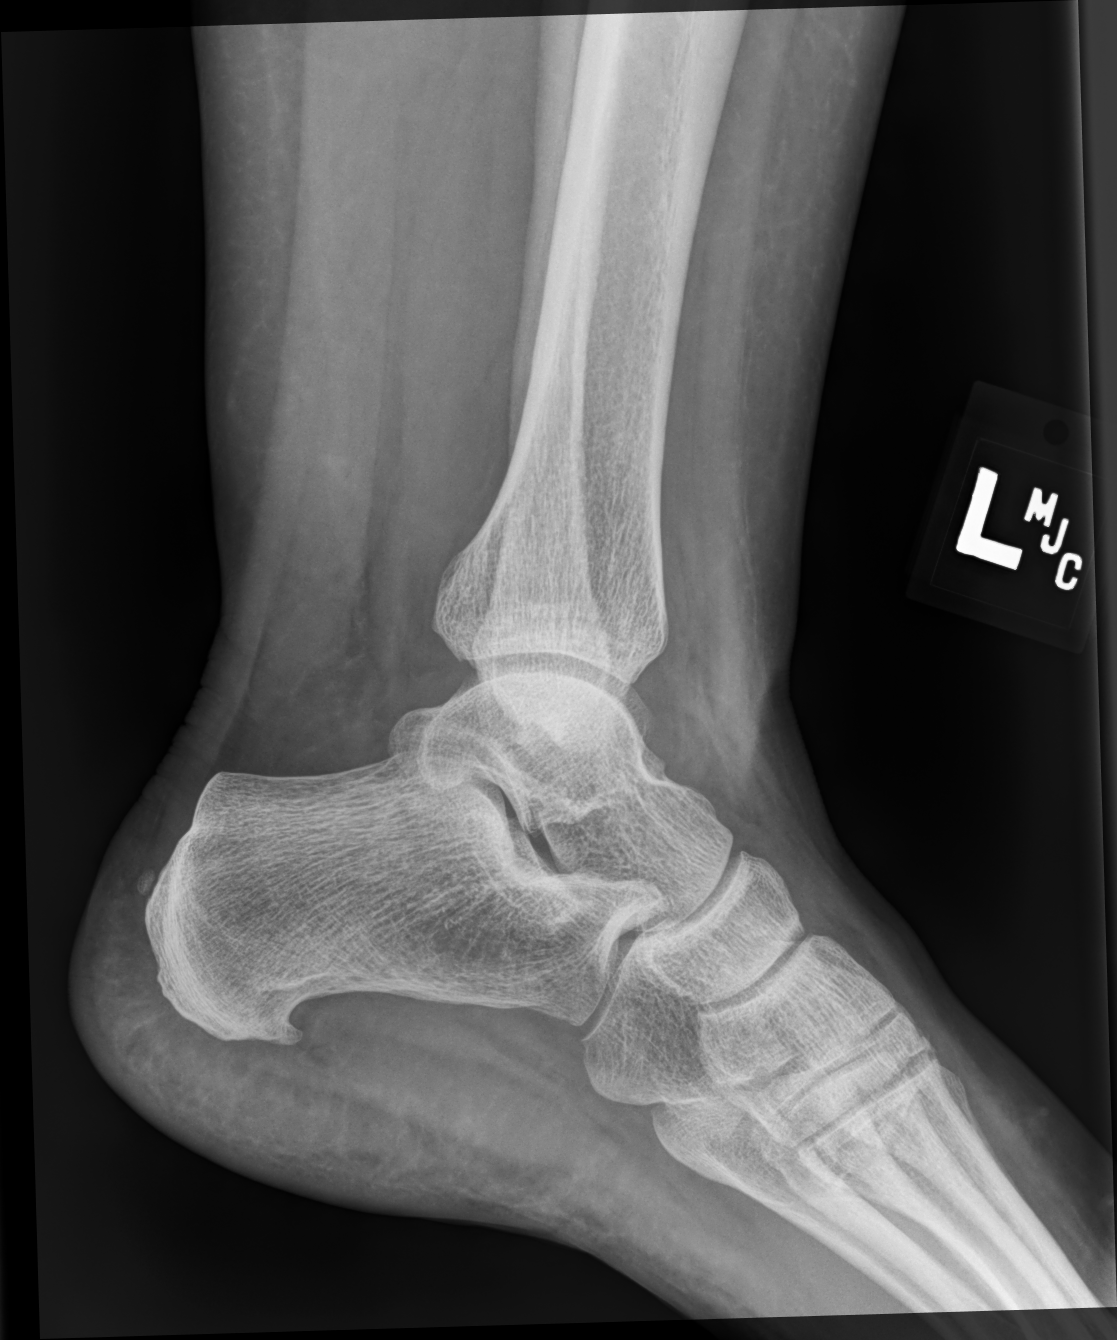

[3 of 3 positions shown; findings below may reference images not displayed]

FINDINGS: Circumferential soft tissue swelling of the lower leg and ankle,
nonspecific. Ankle mortise congruent. No acute displaced fracture.
No joint effusion. Degenerative changes of the hindfoot.
IMPRESSION: Negative for acute bony abnormality

## 2020-06-27 NOTE — Patient Instructions (Addendum)
DUE TO COVID-19 ONLY ONE VISITOR IS ALLOWED TO COME WITH YOU AND STAY IN THE WAITING ROOM ONLY DURING PRE OP AND PROCEDURE DAY OF SURGERY. THE 1 VISITOR  MAY VISIT WITH YOU AFTER SURGERY IN YOUR PRIVATE ROOM DURING VISITING HOURS ONLY!  YOU NEED TO HAVE A COVID 19 TEST ON_2/4______ @_2 :55______, THIS TEST MUST BE DONE BEFORE SURGERY,  COVID TESTING SITE [address] [address], IT IS ON THE RIGHT GOING OUT [address] APPROXIMATELY  2 MINUTES PAST [employer] ON THE RIGHT. ONCE YOUR COVID TEST IS COMPLETED,  PLEASE BEGIN THE QUARANTINE INSTRUCTIONS AS OUTLINED IN YOUR HANDOUT.                Holly Leach    Your procedure is scheduled on: 07/09/20   Report to [facility] Main  Entrance   Report to admitting at   9:00 AM     Call this number if you have problems the morning of surgery [phone]   . BRUSH YOUR TEETH MORNING OF SURGERY AND RINSE YOUR MOUTH OUT, NO CHEWING GUM CANDY OR MINTS.   No food after midnight.    You may have clear liquid until 8:00 AM.    At 7:30 AM drink pre surgery drink.   Nothing by mouth after 8:00 AM.   Take these medicines the morning of surgery with A SIP OF WATER: Levothyroxine, Contraceptive                                 You may not have any metal on your body including hair pins and              piercings  Do not wear jewelry, make-up, lotions, powders or perfumes, deodorant             Do not wear nail polish on your fingernails.  Do not shave  48 hours prior to surgery.                 Do not bring valuables to the hospital. [place].  Contacts, dentures or bridgework may not be worn into surgery.       Patients discharged the day of surgery will not be allowed to drive home.   IF YOU ARE HAVING SURGERY AND GOING HOME THE SAME DAY, YOU MUST HAVE AN ADULT TO DRIVE YOU HOME AND BE WITH YOU FOR 24 HOURS.   YOU MAY GO HOME BY TAXI OR UBER OR  ORTHERWISE, BUT AN ADULT MUST ACCOMPANY YOU HOME AND STAY WITH YOU FOR 24 HOURS.  Name and phone number of your driver:  Special Instructions: N/A              Please read over the following fact sheets you were given: _____________________________________________________________________             [facility] - Preparing for Surgery Before surgery, you can play an important role.   Because skin is not sterile, your skin needs to be as free of germs as possible.   You can reduce the number of germs on your skin by washing with CHG (chlorahexidine gluconate) soap before surgery .  CHG is an antiseptic cleaner which kills germs and bonds with the skin to continue killing germs even after washing. Please DO  NOT use if you have an allergy to CHG or antibacterial soaps.   If your skin becomes reddened/irritated stop using the CHG and inform your nurse when you arrive at Short Stay. Do not shave (including legs and underarms) for at least 48 hours prior to the first CHG shower.  You may shave your face/neck. Please follow these instructions carefully:  1.  Shower with CHG Soap the night before surgery and the  morning of Surgery.  2.  If you choose to wash your hair, wash your hair first as usual with your  normal  shampoo.  3.  After you shampoo, rinse your hair and body thoroughly to remove the  shampoo.                                        4.  Use CHG as you would any other liquid soap.  You can apply chg directly  to the skin and wash                       Gently with a scrungie or clean washcloth.  5.  Apply the CHG Soap to your body ONLY FROM THE NECK DOWN.   Do not use on face/ open                           Wound or open sores. Avoid contact with eyes, ears mouth and genitals (private parts).                       Wash face,  Genitals (private parts) with your normal soap.             6.  Wash thoroughly, paying special attention to the area where your surgery  will be performed.  7.   Thoroughly rinse your body with warm water from the neck down.  8.  DO NOT shower/wash with your normal soap after using and rinsing off  the CHG Soap.             9.  Pat yourself dry with a clean towel.            10.  Wear clean pajamas.            11.  Place clean sheets on your bed the night of your first shower and do not  sleep with pets. Day of Surgery : Do not apply any lotions/deodorants the morning of surgery.  Please wear clean clothes to the hospital/surgery center.  FAILURE TO FOLLOW THESE INSTRUCTIONS MAY RESULT IN THE CANCELLATION OF YOUR SURGERY PATIENT SIGNATURE_________________________________  NURSE SIGNATURE__________________________________  ________________________________________________________________________

## 2020-06-28 ENCOUNTER — Encounter (HOSPITAL_COMMUNITY)
Admission: RE | Admit: 2020-06-28 | Discharge: 2020-06-28 | Disposition: A | Discharge status: Home / Self Care | Payer: 59 | Source: Ambulatory Visit | Attending: General Surgery | Admitting: General Surgery

## 2020-06-28 ENCOUNTER — Encounter (HOSPITAL_COMMUNITY): Payer: Self-pay

## 2020-06-28 ENCOUNTER — Other Ambulatory Visit: Payer: Self-pay

## 2020-06-28 DIAGNOSIS — Z01818 Encounter for other preprocedural examination: Secondary | ICD-10-CM | POA: Diagnosis present

## 2020-06-28 DIAGNOSIS — I10 Essential (primary) hypertension: Secondary | ICD-10-CM | POA: Insufficient documentation

## 2020-06-28 HISTORY — DX: Prediabetes: R73.03

## 2020-06-28 HISTORY — DX: Respiratory tuberculosis unspecified: A15.9

## 2020-06-28 LAB — CBC
HCT: 40.4 % (ref 36.0–46.0)
Hemoglobin: 13.4 g/dL (ref 12.0–15.0)
MCH: 28.2 pg (ref 26.0–34.0)
MCHC: 33.2 g/dL (ref 30.0–36.0)
MCV: 84.9 fL (ref 80.0–100.0)
Platelets: 326 10*3/uL (ref 150–400)
RBC: 4.76 MIL/uL (ref 3.87–5.11)
RDW: 14.6 % (ref 11.5–15.5)
WBC: 10.3 10*3/uL (ref 4.0–10.5)
nRBC: 0 % (ref 0.0–0.2)

## 2020-06-28 LAB — HEMOGLOBIN A1C
Hgb A1c MFr Bld: 5.7 % — ABNORMAL HIGH (ref 4.8–5.6)
Mean Plasma Glucose: 116.89 mg/dL

## 2020-06-28 NOTE — Progress Notes (Signed)
COVID Vaccine Completed:No Date COVID Vaccine completed: COVID vaccine manufacturer: Pfizer    Moderna   [employer]   PCP - Dr. Doristine Section Cardiologist - no  Chest x-ray - no EKG - 06/28/20-chart, epic Stress Test - no ECHO - no Cardiac Cath - no Pacemaker/ICD device last checked:NA  Sleep Study - noCPAP -   Fasting Blood Sugar - NA Checks Blood Sugar _____ times a day  Blood Thinner Instructions:NA Aspirin Instructions: Last Dose:  Anesthesia review:   Patient denies shortness of breath, fever, cough and chest pain at PAT appointment  yes Patient verbalized understanding of instructions that were given to them at the PAT appointment. Patient was also instructed that they will need to review over the PAT instructions again at home before surgery. Yes Pt reports that she gets SOB on the stairs because of Wt. But not doing housework or with ADLs.

## 2020-07-05 ENCOUNTER — Other Ambulatory Visit (HOSPITAL_COMMUNITY)
Admission: RE | Admit: 2020-07-05 | Discharge: 2020-07-05 | Disposition: A | Discharge status: Home / Self Care | Payer: 59 | Source: Ambulatory Visit | Attending: General Surgery | Admitting: General Surgery

## 2020-07-05 DIAGNOSIS — Z01812 Encounter for preprocedural laboratory examination: Secondary | ICD-10-CM | POA: Insufficient documentation

## 2020-07-05 DIAGNOSIS — Z20822 Contact with and (suspected) exposure to covid-19: Secondary | ICD-10-CM | POA: Insufficient documentation

## 2020-07-06 LAB — SARS CORONAVIRUS 2 (TAT 6-24 HRS): SARS Coronavirus 2: NEGATIVE

## 2020-07-08 MED ORDER — BUPIVACAINE LIPOSOME 1.3 % IJ SUSP
20.0000 mL | Freq: Once | INTRAMUSCULAR | Status: DC
  Filled 2020-07-08: qty 20

## 2020-07-09 ENCOUNTER — Encounter (HOSPITAL_COMMUNITY)
Admission: RE | Disposition: A | Discharge status: Home / Self Care | Payer: Self-pay | Source: Home / Self Care | Attending: General Surgery

## 2020-07-09 ENCOUNTER — Ambulatory Visit (HOSPITAL_COMMUNITY): Payer: 59 | Admitting: Physician Assistant

## 2020-07-09 ENCOUNTER — Ambulatory Visit (HOSPITAL_COMMUNITY): Payer: 59 | Admitting: Certified Registered Nurse Anesthetist

## 2020-07-09 ENCOUNTER — Encounter (HOSPITAL_COMMUNITY): Payer: Self-pay | Admitting: General Surgery

## 2020-07-09 ENCOUNTER — Ambulatory Visit (HOSPITAL_COMMUNITY)
Admission: RE | Admit: 2020-07-09 | Discharge: 2020-07-09 | Disposition: A | Discharge status: Home / Self Care | Payer: 59 | Attending: General Surgery | Admitting: General Surgery

## 2020-07-09 DIAGNOSIS — Z881 Allergy status to other antibiotic agents status: Secondary | ICD-10-CM | POA: Diagnosis not present

## 2020-07-09 DIAGNOSIS — K429 Umbilical hernia without obstruction or gangrene: Secondary | ICD-10-CM | POA: Diagnosis not present

## 2020-07-09 DIAGNOSIS — Z880 Allergy status to penicillin: Secondary | ICD-10-CM | POA: Insufficient documentation

## 2020-07-09 HISTORY — PX: UMBILICAL HERNIA REPAIR: SHX196

## 2020-07-09 LAB — PREGNANCY, URINE: Preg Test, Ur: NEGATIVE

## 2020-07-09 SURGERY — REPAIR, HERNIA, UMBILICAL, ROBOT-ASSISTED
Anesthesia: General

## 2020-07-09 MED ORDER — IBUPROFEN 600 MG PO TABS: 600.0000 mg | ORAL_TABLET | Freq: Three times a day (TID) | ORAL | 0 refills | Status: AC | PRN

## 2020-07-09 MED ORDER — PROPOFOL 10 MG/ML IV BOLUS
INTRAVENOUS | Status: DC | PRN
  Administered 2020-07-09: 200 mg via INTRAVENOUS

## 2020-07-09 MED ORDER — FENTANYL CITRATE (PF) 250 MCG/5ML IJ SOLN
INTRAMUSCULAR | Status: AC
  Filled 2020-07-09: qty 5

## 2020-07-09 MED ORDER — FENTANYL CITRATE (PF) 250 MCG/5ML IJ SOLN
INTRAMUSCULAR | Status: DC | PRN
Start: 2020-07-09 — End: 2020-07-09
  Administered 2020-07-09: 150 ug via INTRAVENOUS

## 2020-07-09 MED ORDER — LIP MEDEX EX OINT
TOPICAL_OINTMENT | CUTANEOUS | Status: AC
  Filled 2020-07-09: qty 7

## 2020-07-09 MED ORDER — MIDAZOLAM HCL 2 MG/2ML IJ SOLN
INTRAMUSCULAR | Status: AC
  Filled 2020-07-09: qty 2

## 2020-07-09 MED ORDER — ONDANSETRON HCL 4 MG/2ML IJ SOLN
INTRAMUSCULAR | Status: AC
  Filled 2020-07-09: qty 2

## 2020-07-09 MED ORDER — SUGAMMADEX SODIUM 200 MG/2ML IV SOLN
INTRAVENOUS | Status: DC | PRN
  Administered 2020-07-09: 200 mg via INTRAVENOUS

## 2020-07-09 MED ORDER — MIDAZOLAM HCL 5 MG/5ML IJ SOLN
INTRAMUSCULAR | Status: DC | PRN
  Administered 2020-07-09: 2 mg via INTRAVENOUS

## 2020-07-09 MED ORDER — OXYCODONE HCL 5 MG PO TABS: 5.0000 mg | ORAL_TABLET | Freq: Four times a day (QID) | ORAL | 0 refills | Status: DC | PRN

## 2020-07-09 MED ORDER — ACETAMINOPHEN 500 MG PO TABS
1000.0000 mg | ORAL_TABLET | ORAL | Status: AC
  Administered 2020-07-09: 1000 mg via ORAL
  Filled 2020-07-09: qty 2

## 2020-07-09 MED ORDER — POLYETHYLENE GLYCOL 3350 17 G PO PACK: 17.0000 g | PACK | Freq: Every day | ORAL | 0 refills | Status: DC | PRN

## 2020-07-09 MED ORDER — GABAPENTIN 300 MG PO CAPS: 300.0000 mg | ORAL_CAPSULE | Freq: Three times a day (TID) | ORAL | 0 refills | Status: DC | PRN

## 2020-07-09 MED ORDER — FENTANYL CITRATE (PF) 100 MCG/2ML IJ SOLN
25.0000 ug | INTRAMUSCULAR | Status: DC | PRN
  Administered 2020-07-09: 50 ug via INTRAVENOUS

## 2020-07-09 MED ORDER — LIDOCAINE 2% (20 MG/ML) 5 ML SYRINGE
INTRAMUSCULAR | Status: DC | PRN
  Administered 2020-07-09: 60 mg via INTRAVENOUS

## 2020-07-09 MED ORDER — CEFAZOLIN SODIUM-DEXTROSE 2-4 GM/100ML-% IV SOLN
2.0000 g | INTRAVENOUS | Status: AC
  Administered 2020-07-09: 2 g via INTRAVENOUS
  Filled 2020-07-09: qty 100

## 2020-07-09 MED ORDER — FENTANYL CITRATE (PF) 100 MCG/2ML IJ SOLN
INTRAMUSCULAR | Status: AC
  Administered 2020-07-09: 50 ug via INTRAVENOUS
  Filled 2020-07-09: qty 2

## 2020-07-09 MED ORDER — ENSURE PRE-SURGERY PO LIQD
296.0000 mL | Freq: Once | ORAL | Status: DC
  Filled 2020-07-09: qty 296

## 2020-07-09 MED ORDER — AMISULPRIDE (ANTIEMETIC) 5 MG/2ML IV SOLN: 10.0000 mg | Freq: Once | INTRAVENOUS | Status: DC | PRN

## 2020-07-09 MED ORDER — CHLORHEXIDINE GLUCONATE CLOTH 2 % EX PADS: 6.0000 | MEDICATED_PAD | Freq: Once | CUTANEOUS | Status: DC

## 2020-07-09 MED ORDER — CHLORHEXIDINE GLUCONATE 0.12 % MT SOLN
15.0000 mL | Freq: Once | OROMUCOSAL | Status: AC
  Administered 2020-07-09: 15 mL via OROMUCOSAL

## 2020-07-09 MED ORDER — DEXAMETHASONE SODIUM PHOSPHATE 10 MG/ML IJ SOLN
INTRAMUSCULAR | Status: AC
  Filled 2020-07-09: qty 1

## 2020-07-09 MED ORDER — KETOROLAC TROMETHAMINE 30 MG/ML IJ SOLN: 30.0000 mg | Freq: Once | INTRAMUSCULAR | Status: DC | PRN

## 2020-07-09 MED ORDER — OXYCODONE HCL 5 MG PO TABS
5.0000 mg | ORAL_TABLET | Freq: Once | ORAL | Status: AC | PRN
  Administered 2020-07-09: 5 mg via ORAL

## 2020-07-09 MED ORDER — ORAL CARE MOUTH RINSE: 15.0000 mL | Freq: Once | OROMUCOSAL | Status: AC

## 2020-07-09 MED ORDER — ONDANSETRON HCL 4 MG/2ML IJ SOLN
INTRAMUSCULAR | Status: DC | PRN
  Administered 2020-07-09: 4 mg via INTRAVENOUS

## 2020-07-09 MED ORDER — LIDOCAINE HCL (PF) 2 % IJ SOLN
INTRAMUSCULAR | Status: DC | PRN
  Administered 2020-07-09: 1 mg/kg/h via INTRADERMAL

## 2020-07-09 MED ORDER — EPHEDRINE SULFATE-NACL 50-0.9 MG/10ML-% IV SOSY
PREFILLED_SYRINGE | INTRAVENOUS | Status: DC | PRN
  Administered 2020-07-09: 10 mg via INTRAVENOUS

## 2020-07-09 MED ORDER — SCOPOLAMINE 1 MG/3DAYS TD PT72
1.0000 | MEDICATED_PATCH | TRANSDERMAL | Status: DC
  Administered 2020-07-09: 1.5 mg via TRANSDERMAL

## 2020-07-09 MED ORDER — OXYCODONE HCL 5 MG PO TABS
ORAL_TABLET | ORAL | Status: AC
  Filled 2020-07-09: qty 1

## 2020-07-09 MED ORDER — PROMETHAZINE HCL 25 MG/ML IJ SOLN: 6.2500 mg | INTRAMUSCULAR | Status: DC | PRN

## 2020-07-09 MED ORDER — DEXAMETHASONE SODIUM PHOSPHATE 10 MG/ML IJ SOLN
INTRAMUSCULAR | Status: DC | PRN
  Administered 2020-07-09: 8 mg via INTRAVENOUS

## 2020-07-09 MED ORDER — PROPOFOL 10 MG/ML IV BOLUS
INTRAVENOUS | Status: AC
  Filled 2020-07-09: qty 20

## 2020-07-09 MED ORDER — ACETAMINOPHEN 500 MG PO TABS: 1000.0000 mg | ORAL_TABLET | Freq: Three times a day (TID) | ORAL | 0 refills | Status: AC

## 2020-07-09 MED ORDER — GABAPENTIN 300 MG PO CAPS
ORAL_CAPSULE | ORAL | Status: AC
  Administered 2020-07-09: 300 mg via ORAL
  Filled 2020-07-09: qty 1

## 2020-07-09 MED ORDER — LACTATED RINGERS IV SOLN: INTRAVENOUS | Status: DC

## 2020-07-09 MED ORDER — OXYCODONE HCL 5 MG/5ML PO SOLN: 5.0000 mg | Freq: Once | ORAL | Status: AC | PRN

## 2020-07-09 MED ORDER — BUPIVACAINE-EPINEPHRINE (PF) 0.25% -1:200000 IJ SOLN
INTRAMUSCULAR | Status: AC
  Filled 2020-07-09: qty 30

## 2020-07-09 MED ORDER — SCOPOLAMINE 1 MG/3DAYS TD PT72
MEDICATED_PATCH | TRANSDERMAL | Status: AC
  Filled 2020-07-09: qty 1

## 2020-07-09 MED ORDER — PHENYLEPHRINE 40 MCG/ML (10ML) SYRINGE FOR IV PUSH (FOR BLOOD PRESSURE SUPPORT)
PREFILLED_SYRINGE | INTRAVENOUS | Status: DC | PRN
  Administered 2020-07-09: 160 ug via INTRAVENOUS
  Administered 2020-07-09 (×2): 120 ug via INTRAVENOUS
  Administered 2020-07-09: 80 ug via INTRAVENOUS

## 2020-07-09 MED ORDER — BUPIVACAINE LIPOSOME 1.3 % IJ SUSP: INTRAMUSCULAR | Status: DC | PRN

## 2020-07-09 MED ORDER — ROCURONIUM BROMIDE 10 MG/ML (PF) SYRINGE
PREFILLED_SYRINGE | INTRAVENOUS | Status: AC
  Filled 2020-07-09: qty 10

## 2020-07-09 MED ORDER — GABAPENTIN 300 MG PO CAPS: 300.0000 mg | ORAL_CAPSULE | ORAL | Status: AC

## 2020-07-09 MED ORDER — ROCURONIUM BROMIDE 10 MG/ML (PF) SYRINGE
PREFILLED_SYRINGE | INTRAVENOUS | Status: DC | PRN
  Administered 2020-07-09: 60 mg via INTRAVENOUS
  Administered 2020-07-09: 20 mg via INTRAVENOUS

## 2020-07-09 SURGICAL SUPPLY — 66 items
APPLICATOR COTTON TIP 6 STRL (MISCELLANEOUS) ×2 IMPLANT
APPLICATOR COTTON TIP 6IN STRL (MISCELLANEOUS) ×4
BLADE SURG SZ11 CARB STEEL (BLADE) ×2 IMPLANT
BNDG ADH 1X3 SHEER STRL LF (GAUZE/BANDAGES/DRESSINGS) ×8 IMPLANT
CANNULA REDUC XI 12-8 STAPL (CANNULA) ×2
CANNULA REDUCER 12-8 DVNC XI (CANNULA) ×1 IMPLANT
CHLORAPREP W/TINT 26 (MISCELLANEOUS) ×2 IMPLANT
CLSR STERI-STRIP ANTIMIC 1/2X4 (GAUZE/BANDAGES/DRESSINGS) ×2 IMPLANT
COVER SURGICAL LIGHT HANDLE (MISCELLANEOUS) ×2 IMPLANT
COVER TIP SHEARS 8 DVNC (MISCELLANEOUS) IMPLANT
COVER TIP SHEARS 8MM DA VINCI (MISCELLANEOUS)
COVER WAND RF STERILE (DRAPES) IMPLANT
DECANTER SPIKE VIAL GLASS SM (MISCELLANEOUS) ×2 IMPLANT
DRAPE ARM DVNC X/XI (DISPOSABLE) ×4 IMPLANT
DRAPE COLUMN DVNC XI (DISPOSABLE) ×1 IMPLANT
DRAPE DA VINCI XI ARM (DISPOSABLE) ×8
DRAPE DA VINCI XI COLUMN (DISPOSABLE) ×2
DRSG TEGADERM 2-3/8X2-3/4 SM (GAUZE/BANDAGES/DRESSINGS) ×2 IMPLANT
ELECT REM PT RETURN 15FT ADLT (MISCELLANEOUS) ×2 IMPLANT
GAUZE SPONGE 2X2 8PLY STRL LF (GAUZE/BANDAGES/DRESSINGS) ×1 IMPLANT
GLOVE INDICATOR 8.0 STRL GRN (GLOVE) ×4 IMPLANT
GLOVE SURG ENC MOIS LTX SZ7.5 (GLOVE) ×4 IMPLANT
GLOVE SURG SS PI 6.5 STRL IVOR (GLOVE) ×4 IMPLANT
GOWN STRL REUS W/TWL XL LVL3 (GOWN DISPOSABLE) ×4 IMPLANT
GRASPER SUT TROCAR 14GX15 (MISCELLANEOUS) ×2 IMPLANT
KIT BASIN OR (CUSTOM PROCEDURE TRAY) ×2 IMPLANT
KIT TURNOVER KIT A (KITS) ×2 IMPLANT
MARKER SKIN DUAL TIP RULER LAB (MISCELLANEOUS) ×2 IMPLANT
MESH VENTRALIGHT ST 4.5IN (Mesh General) ×2 IMPLANT
NEEDLE HYPO 22GX1.5 SAFETY (NEEDLE) ×2 IMPLANT
NEEDLE INSUFFLATION 14GA 120MM (NEEDLE) IMPLANT
NEEDLE INSUFFLATION 14GA 150MM (NEEDLE) ×2 IMPLANT
PACK CARDIOVASCULAR III (CUSTOM PROCEDURE TRAY) ×2 IMPLANT
PAD POSITIONING PINK XL (MISCELLANEOUS) ×2 IMPLANT
SCISSORS LAP 5X35 DISP (ENDOMECHANICALS) IMPLANT
SEAL CANN UNIV 5-8 DVNC XI (MISCELLANEOUS) ×2 IMPLANT
SEAL XI 5MM-8MM UNIVERSAL (MISCELLANEOUS) ×4
SET IRRIG TUBING LAPAROSCOPIC (IRRIGATION / IRRIGATOR) IMPLANT
SET TUBE SMOKE EVAC HIGH FLOW (TUBING) ×2 IMPLANT
SOL ANTI FOG 6CC (MISCELLANEOUS) ×1 IMPLANT
SOLUTION ANTI FOG 6CC (MISCELLANEOUS) ×1
SOLUTION ELECTROLUBE (MISCELLANEOUS) ×2 IMPLANT
SPONGE GAUZE 2X2 STER 10/PKG (GAUZE/BANDAGES/DRESSINGS) ×1
SPONGE LAP 18X18 RF (DISPOSABLE) ×2 IMPLANT
STAPLER CANNULA SEAL DVNC XI (STAPLE) ×1 IMPLANT
STAPLER CANNULA SEAL XI (STAPLE) ×2
STRIP CLOSURE SKIN 1/2X4 (GAUZE/BANDAGES/DRESSINGS) ×2 IMPLANT
SUT DVC VLOC 180 2-0 12IN GS21 (SUTURE) ×4
SUT MNCRL AB 4-0 PS2 18 (SUTURE) ×2 IMPLANT
SUT SILK 3 0 (SUTURE) ×2
SUT SILK 3-0 KS 30XBRD (SUTURE) ×1 IMPLANT
SUT STRAFIX SYMMETRIC 0-0 12 (SUTURE) ×2
SUT VIC AB 0 CT1 36 (SUTURE) ×2 IMPLANT
SUT VIC AB 3-0 SH 27 (SUTURE) ×4
SUT VIC AB 3-0 SH 27XBRD (SUTURE) ×2 IMPLANT
SUT VICRYL 0 UR6 27IN ABS (SUTURE) ×2 IMPLANT
SUT VLOC 180 2-0 6IN GS21 (SUTURE) ×4 IMPLANT
SUT VLOC 180 2-0 9IN GS21 (SUTURE) IMPLANT
SUT VLOC 180 3-0 9IN GS21 (SUTURE) IMPLANT
SUTURE DVC VL 180 2-0 12INGS21 (SUTURE) ×2 IMPLANT
SUTURE STRAFIX SYMMETRC 0-0 12 (SUTURE) ×1 IMPLANT
SYR 10ML ECCENTRIC (SYRINGE) ×2 IMPLANT
SYR 20ML LL LF (SYRINGE) ×2 IMPLANT
TOWEL OR 17X26 10 PK STRL BLUE (TOWEL DISPOSABLE) ×2 IMPLANT
TOWEL OR NON WOVEN STRL DISP B (DISPOSABLE) ×2 IMPLANT
TROCAR BLADELESS OPT 5 100 (ENDOMECHANICALS) IMPLANT

## 2020-07-09 NOTE — Anesthesia Preprocedure Evaluation (Addendum)
Anesthesia Evaluation  Patient identified by MRN, date of birth, ID band Patient awake    Reviewed: Allergy & Precautions, NPO status , Patient's Chart, lab work & pertinent test results  Airway Mallampati: II  TM Distance: >3 FB Neck ROM: Full    Dental no notable dental hx.    Pulmonary PE   Pulmonary exam normal breath sounds clear to auscultation       Cardiovascular hypertension, Pt. on medications Normal cardiovascular exam Rhythm:Regular Rate:Normal  ECG: NSR, rate 73   Neuro/Psych  Headaches, negative psych ROS   GI/Hepatic negative GI ROS, Neg liver ROS,   Endo/Other  Hypothyroidism   Renal/GU negative Renal ROS     Musculoskeletal negative musculoskeletal ROS (+)   Abdominal (+) + obese,   Peds  Hematology negative hematology ROS (+)   Anesthesia Other Findings umbilical hernia  Reproductive/Obstetrics hcg negative                            Anesthesia Physical Anesthesia Plan  ASA: II  Anesthesia Plan: General   Post-op Pain Management:    Induction: Intravenous  PONV Risk Score and Plan: 4 or greater and Ondansetron, Dexamethasone, Midazolam, Scopolamine patch - Pre-op and Treatment may vary due to age or medical condition  Airway Management Planned: Oral ETT  Additional Equipment:   Intra-op Plan:   Post-operative Plan: Extubation in OR  Informed Consent: I have reviewed the patients History and Physical, chart, labs and discussed the procedure including the risks, benefits and alternatives for the proposed anesthesia with the patient or authorized representative who has indicated his/her understanding and acceptance.     Dental advisory given  Plan Discussed with: CRNA  Anesthesia Plan Comments:        Anesthesia Quick Evaluation

## 2020-07-09 NOTE — Anesthesia Procedure Notes (Signed)
Procedure Name: Intubation Date/Time: 07/09/2020 10:26 AM Performed by: Gerald Leitz, CRNA Pre-anesthesia Checklist: Patient identified, Patient being monitored, Timeout performed, Emergency Drugs available and Suction available Patient Re-evaluated:Patient Re-evaluated prior to induction Oxygen Delivery Method: Circle system utilized Preoxygenation: Pre-oxygenation with 100% oxygen Induction Type: IV induction Ventilation: Mask ventilation without difficulty Laryngoscope Size: Mac and 3 Grade View: Grade I Tube type: Oral Tube size: 7.0 mm Number of attempts: 1 Placement Confirmation: ETT inserted through vocal cords under direct vision,  positive ETCO2 and breath sounds checked- equal and bilateral Secured at: 21 cm Tube secured with: Tape Dental Injury: Teeth and Oropharynx as per pre-operative assessment

## 2020-07-09 NOTE — Op Note (Signed)
07/09/2020  1:41 PM  PATIENT:  Holly Leach  50 y.o. female  PRE-OPERATIVE DIAGNOSIS:  umbilical hernia  POST-OPERATIVE DIAGNOSIS:  umbilical hernia  PROCEDURE:  Procedure(s): XI ROBOT ASSISTED UMBILICAL HERNIA REPAIR WITH MESH (IPOM) LAPAROSCOPIC BILATERAL TAP BLOCK  SURGEON:  Surgeon(s): Greer Pickerel, MD   ASSISTANTS: Clovis Riley, MD    ANESTHESIA:   general  DRAINS: none   LOCAL MEDICATIONS USED:  MARCAINE    and OTHER EXPAREL  SPECIMEN:  No Specimen  DISPOSITION OF SPECIMEN:  N/A  COUNTS:  YES  INDICATION FOR PROCEDURE: Patient presents for repair of a symptomatic umbilical hernia.  She had had ongoing episodes of discomfort at her umbilicus and desired surgical intervention.  We discussed risk and benefits of the procedure.  PROCEDURE: Patient was given oral Tylenol and gabapentin prior to surgery.  After obtaining informed consent she was taken to the OR to at [facility] and placed supine on the operating table.  General endotracheal anesthesia was established.  Sequential compression devices were placed.  Foley catheter was placed.  Her arms were tucked at her side with the appropriate padding.  She was positioned on the left edge of the bed but still had padding underneath her.  a bump was placed under her left side.  Her abdomen was prepped and draped in the usual standard surgical fashion.  IV antibiotic was administered.  Surgical timeout was performed.  I gained access to the abdomen using the Optiview technique in the lateral left upper quadrant using a 0 degree 5 mm laparoscope advancing it through all layers of the abdominal wall through a 5 mm trocar.  The abdominal cavity was carefully entered and pneumoperitoneum was smoothly established up to a patient pressure of 15 mmHg.  The laparoscope was advanced and the abdominal cavity was surveilled.  There was no evidence of injury to surrounding structures.  There was nothing contained within the  fascial defect at the umbilicus.  There is no other evidence of fascial defect in the abdominal wall.    Using a spinal needle I outlined the edges of the fascial defect with a marking pen on the skin surface.  The defect measured 3 cm x 3 cm.  I then added 5 cm sequentially around that on the skin with a marking pen to determine the mesh size.  This resulted in a 11 cm piece of mesh.  I then placed my robotic trochars.  I placed a 8 mm bariatric length trocar in the left lower quadrant being careful of the anterior superior iliac spine, then an 8 mm trocar in the left lateral abdominal wall.  I then changed the optical entry trocar in the left upper quadrant to a 12 mm robotic trocar.  I then obtained a Bard ventral light ST mesh wound measuring 11.5 cm.  A central suture was placed in the center of the mesh with a 0 Vicryl.  The mesh was then placed through the 12 Miller trocar into the abdomen.  We then placed the 0 strata fix suture along with two 2 OV lock absorbable 9 inch sutures into the abdomen with a laparoscopic needle driver.  The needles were placed in the right lateral abdominal wall.  We then performed a laparoscopic bilateral tap block with a combination of the Marcaine and Exparel.  An 8 mm reducer had been placed in the robotic 12 mm trocar  We then docked the robot from patient's right.  The robotic arms were attached.  The anatomy was targeted and fenestrated bipolar grasper was placed in arm 1 and robotic scissors were placed in arm 3.  The arms were angled up toward the abdominal wall and burped.  I then went to the robotic console while my assistant stayed at the bedside.  I took down the falciform ligament with the robotic scissors.  I then reduced preperitoneal fat from the umbilical hernia.  There was not any significant bulky adipose tissue in the lower midline.  I then went about closing the umbilical fascial defect transversely using the strata fix suture.  I then ran the  suture back onto itself.  The suture was trimmed and the needle was placed in the right lateral abdominal wall.  My assistant then made a small stab incision at the umbilical skin.  The PMI suture passer was then placed and the previously placed suture in the center of the mesh was grabbed and brought up and secured with a hemostat on the patient outside the skin.   I then started suturing the edges of the mesh to the peritoneum starting on the right side running up toward the edge of the mesh in the upper abdomen using a baseball stitch method.  We made sure that the mesh was not pulled toward 1 side over the other side.  It remains centrally located.  Then using the other 0 V-Loc suture I started on the right and ran inferiorly and around but I inadvertently cut the needle with the needle driver.  So my assistant undocked arm 3 and was able to pass 2 additional 2 OV lock sutures into the abdomen.  I then finished securing the mesh to the peritoneum on the ipsilateral side of the trochars.  None at the mesh edges were rolled over.  I then ran the lock down the center of the mesh as a central fixation.  The mesh was flush against the abdominal wall.  There were no exposed edges of the mesh.  We then undocked the robot.  My assistant then extracted the 5 needles out of the abdomen.  We then closed the 12 mm trocar site in the left upper quadrant with an interrupted 0 Vicryl suture using a PMI suture passer.  Pneumoperitoneum was released.  Skin incisions were closed with a 4-0 Monocryl in a subcuticular fashion followed by the application of Steri-Strips, 2 x 2 and Tegaderms.  Foley catheter was removed.  All needle, instrument, and sponge counts were correct x2.  Assistant was requested for passage of needles and mesh  Type of repair - primary suture with mesh underlay (IPOM)  (choices - primary suture, mesh, or component)  Name of mesh - bard ventralightST  Size of mesh - round 11.5 cm  Mesh  overlap - 5 cm  Placement of mesh - beneath fascia and into peritoneal cavity   PLAN OF CARE: Discharge to home after PACU  PATIENT DISPOSITION:  PACU - hemodynamically stable.   Delay start of Pharmacological VTE agent (>24hrs) due to surgical blood loss or risk of bleeding:  not applicable  Leighton Ruff. Redmond Pulling, MD, FACS General, Bariatric, & Minimally Invasive Surgery [facility], [place]

## 2020-07-09 NOTE — Brief Op Note (Signed)
07/09/2020  12:58 PM  PATIENT:  Holly Leach  50 y.o. female  PRE-OPERATIVE DIAGNOSIS:  umbilical hernia  POST-OPERATIVE DIAGNOSIS:  umbilical hernia  PROCEDURE:  Procedure(s): XI ROBOT ASSISTED UMBILICAL HERNIA REPAIR WITH MESH (N/A) Laparoscopic bilateral TAP block  SURGEON:  Surgeon(s) and Role:    Greer Pickerel, MD - Primary    * Clovis Riley, MD - Assisting  PHYSICIAN ASSISTANT:   ASSISTANTS: see above   ANESTHESIA:   general  EBL:  10 mL   BLOOD ADMINISTERED:none  DRAINS: none   LOCAL MEDICATIONS USED:  MARCAINE    and OTHER exparel  SPECIMEN:  No Specimen  DISPOSITION OF SPECIMEN:  N/A  COUNTS:  YES  TOURNIQUET:  * No tourniquets in log *  DICTATION: .Dragon Dictation  PLAN OF CARE: Discharge to home after PACU  PATIENT DISPOSITION:  PACU - hemodynamically stable.   Delay start of Pharmacological VTE agent (>24hrs) due to surgical blood loss or risk of bleeding: not applicable  Leighton Ruff. Redmond Pulling, MD, FACS General, Bariatric, & Minimally Invasive Surgery [facility], [place]

## 2020-07-09 NOTE — Anesthesia Postprocedure Evaluation (Signed)
Anesthesia Post Note  Patient: Holly Leach  Procedure(s) Performed: XI ROBOT ASSISTED UMBILICAL HERNIA REPAIR WITH MESH (N/A )     Patient location during evaluation: PACU Anesthesia Type: General Level of consciousness: awake Pain management: pain level controlled Vital Signs Assessment: post-procedure vital signs reviewed and stable Respiratory status: spontaneous breathing, nonlabored ventilation, respiratory function stable and patient connected to nasal cannula oxygen Cardiovascular status: blood pressure returned to baseline and stable Postop Assessment: no apparent nausea or vomiting Anesthetic complications: no   No complications documented.  Last Vitals:  Vitals:   07/09/20 1400 07/09/20 1431  BP: 125/82 131/72  Pulse: 82 86  Resp: 12   Temp:  37 C  SpO2: 99% 95%    Last Pain:  Vitals:   07/09/20 1431  TempSrc: Oral  PainSc: 4                  Brittan Butterbaugh P Breck Hollinger

## 2020-07-09 NOTE — Transfer of Care (Signed)
Immediate Anesthesia Transfer of Care Note  Patient: Holly Leach  Procedure(s) Performed: Procedure(s): XI ROBOT ASSISTED UMBILICAL HERNIA REPAIR WITH MESH (N/A)  Patient Location: PACU  Anesthesia Type:General  Level of Consciousness: Alert, Awake, Oriented  Airway & Oxygen Therapy: Patient Spontanous Breathing  Post-op Assessment: Report given to RN  Post vital signs: Reviewed and stable  Last Vitals:  Vitals:   07/09/20 0848  BP: (!) 152/95  Pulse: (!) 115  Resp: 16  Temp: 36.8 C  SpO2: 76%    Complications: No apparent anesthesia complications

## 2020-07-09 NOTE — H&P (Addendum)
CC: Here for surgery  Requesting provider: Dr Darron Doom  HPI: Holly Leach is an 50 y.o. female who is here for robotic repair of her umbilical hernia with mesh.  I initially met her in September 2021.  She states that since we initially met she had one additional episode of fairly intense periumbilical pain and that is what prompted her to call the office to get scheduled for surgery.  Otherwise she denies any medical changes since I initially met her.  She states that she had a little bit of sinus irritation after her preoperative Covid test.  No fevers or chills.  No nausea or vomiting.  No chest pain or chest pressure or shortness of breath.  No productive cough.   She had an episode of chest discomfort in 2006.  This prompted a trip to the emergency room.  An initial CT angiogram questioned a small filling defect in one of the pulmonary distal branches and could not exclude a pulmonary embolism.  Duplex Dopplers of her legs were negative.  A follow-up CT angiogram the next day was negative for any evidence of pulmonary aneurysm.  She had labs and a hematology evaluation with Dr. Marin Olp in 2017.  There was no genetic mutation or prothrombotic state identified.  Her blood work-up was negative  History: The patient is a 50 year old female who presents with an umbilical hernia. She is referred by Dr Darron Doom for evaluation of an abdominal hernia. She states that she has had periumbilical pain since 4403. It was so intense the first time that prompted her to go to the emergency room where they gave her a GI cocktail. She then was referred to [facility] and they thought it may be her gallbladder and she had an abdominal ultrasound that showed sludge. She continued to have intermittent episodes of periumbilical pain radiating up into her upper midline. She ended up establishing care with a different primary care physician. She had another episode in February 2020 which prompted a scan which was  negative she states. She had another episode in May 2021 which also resulted in a scan. She states initially the episodes were maybe a few times a year but they're becoming more frequent. That'll start at her belly button and radiate upward. She make a little bit bloated and gassy with it. She denies any prior abdominal surgery. Her PCP gave her some meloxicam which has helped when she does have the flares. She had a CT scan at an outside institution. I was able to review the report I do not have access it imaging. She had a fluid-containing periumbilical hernia but no bowel. She does not smoke. She denies any chest pain, chest pressure, source of breath, dyspnea on exertion. She works in [employer].  Past Medical History:  Diagnosis Date  . Chronic headaches   . Hypertension   . PE (pulmonary embolism) 2006  . Pre-diabetes   . Tuberculosis    as a child but no problems now    Past Surgical History:  Procedure Laterality Date  . FRACTURE SURGERY Left 2020   foot     Family History  Problem Relation Age of Onset  . Hypertension Mother   . Hypertension Father   . Heart disease Father   . Hypertension Brother   . Hyperlipidemia Brother   . Diabetes Paternal Grandmother   . Irritable bowel syndrome Sister   . Irritable bowel syndrome Maternal Aunt     Social:  reports that she has  never smoked. She has never used smokeless tobacco. She reports that she does not drink alcohol and does not use drugs.  Allergies:  Allergies  Allergen Reactions  . Augmentin [Amoxicillin-Pot Clavulanate] Diarrhea    Yeast Infection    Medications: I have reviewed the patient's current medications.   ROS - all of the below systems have been reviewed with the patient and positives are indicated with bold text General: chills, fever or night sweats Eyes: blurry vision or double vision ENT: epistaxis or sore throat Allergy/Immunology: itchy/watery eyes or nasal  congestion Hematologic/Lymphatic: bleeding problems, blood clots or swollen lymph nodes Endocrine: temperature intolerance or unexpected weight changes Breast: new or changing breast lumps or nipple discharge Resp: cough, shortness of breath, or wheezing CV: chest pain or dyspnea on exertion GI: as per HPI GU: dysuria, trouble voiding, or hematuria MSK: joint pain or joint stiffness Neuro: TIA or stroke symptoms Derm: pruritus and skin lesion changes Psych: anxiety and depression  PE Blood pressure (!) 152/95, pulse (!) 115, temperature 98.3 F (36.8 C), temperature source Oral, resp. rate 16, height 5' 3"  (1.6 m), weight 97.5 kg, SpO2 97 %. Constitutional: NAD; conversant; no deformities Eyes: Moist conjunctiva; no lid lag; anicteric; PERRL Neck: Trachea midline; no thyromegaly Lungs: Normal respiratory effort; no tactile fremitus CV: RRR; no palpable thrills; no pitting edema GI: Abd soft, small bulge at base of umbilicus, nontender; no palpable hepatosplenomegaly MSK: Normal gait; no clubbing/cyanosis Psychiatric: Appropriate affect; alert and oriented x3 Lymphatic: No palpable cervical or axillary lymphadenopathy Skin:no rash/lesions/edema  Results for orders placed or performed during the hospital encounter of 07/09/20 (from the past 48 hour(s))  Pregnancy, urine per protocol     Status: None   Collection Time: 07/09/20  8:35 AM  Result Value Ref Range   Preg Test, Ur NEGATIVE NEGATIVE    Comment:        THE SENSITIVITY OF THIS METHODOLOGY IS >20 mIU/mL. Performed at [facility], [place] [address], [place], [place] [address]     No results found.  Imaging: Reviewed ct report  A/P: Holly Leach is an 50 y.o. female with  Symptomatic umbilical hernia Obesity  Even though her BMI is above 35 she is fairly symptomatic with respect to her umbilical hernia and is aware of the increased risk of recurrence which we discussed at her initial visit think  it is reasonable to proceed with repair.  I rediscussed the steps of the procedure.  I rediscussed the aftercare.  I discussed pain control after surgery.  I discussed postoperative diet and activity.  We will give her 5000 units of subcutaneous heparin preoperatively. IV antibiotic Enhanced recovery protocol  All of her questions were asked and answered  Leighton Ruff. Redmond Pulling, MD, FACS General, Bariatric, & Minimally Invasive Surgery [facility], [place]

## 2020-07-09 NOTE — Discharge Instructions (Signed)
[facility] °LAPAROSCOPIC SURGERY: POST OP INSTRUCTIONS °Always review your discharge instruction sheet given to you by the facility where your surgery was performed. °IF YOU HAVE DISABILITY OR FAMILY LEAVE FORMS, YOU MUST BRING THEM TO THE OFFICE FOR PROCESSING.   °DO NOT GIVE THEM TO YOUR DOCTOR. ° °PAIN CONTROL ° °1. First take acetaminophen (Tylenol) AND/or ibuprofen (Advil) to control your pain after surgery.  Follow directions on package.  Taking acetaminophen (Tylenol) and/or ibuprofen (Advil) regularly after surgery will help to control your pain and lower the amount of prescription pain medication you may need.  You should not take more than 3,000 mg (3 grams) of acetaminophen (Tylenol) in 24 hours.  You should not take ibuprofen (Advil), aleve, motrin, naprosyn or other NSAIDS if you have a history of stomach ulcers or chronic kidney disease.  °2. A prescription for pain medication may be given to you upon discharge.  Take your pain medication as prescribed, if you still have uncontrolled pain after taking acetaminophen (Tylenol) or ibuprofen (Advil). °3. Use ice packs to help control pain. °4. If you need a refill on your pain medication, please contact your pharmacy.  They will contact our office to request authorization. Prescriptions will not be filled after 5pm or on week-ends. ° °HOME MEDICATIONS °5. Take your usually prescribed medications unless otherwise directed. ° °DIET °6. You should follow a light diet the first few days after arrival home.  Be sure to include lots of fluids daily. Avoid fatty, fried foods.  ° °CONSTIPATION °7. It is common to experience some constipation after surgery and if you are taking pain medication.  Increasing fluid intake and taking a stool softener (such as Colace) will usually help or prevent this problem from occurring.  A mild laxative (Milk of Magnesia or Miralax) should be taken according to package instructions if there are no bowel  movements after 48 hours. ° °WOUND/INCISION CARE °8. Most patients will experience some swelling and bruising in the area of the incisions.  Ice packs will help.  Swelling and bruising can take several days to resolve.  °9. Unless discharge instructions indicate otherwise, follow guidelines below  °a. STERI-STRIPS - you may remove your outer bandages 48 hours after surgery, and you may shower at that time.  You have steri-strips (small skin tapes) in place directly over the incision.  These strips should be left on the skin for 7-10 days.   °b. DERMABOND/SKIN GLUE - you may shower in 24 hours.  The glue will flake off over the next 2-3 weeks. °10. Any sutures or staples will be removed at the office during your follow-up visit. ° °ACTIVITIES °11. You may resume regular (light) daily activities beginning the next day--such as daily self-care, walking, climbing stairs--gradually increasing activities as tolerated.  You may have sexual intercourse when it is comfortable.  Refrain from any heavy lifting or straining until approved by your doctor. °a. You may drive when you are no longer taking prescription pain medication, you can comfortably wear a seatbelt, and you can safely maneuver your car and apply brakes. ° °FOLLOW-UP °12. You should see your doctor in the office for a follow-up appointment approximately 2-3 weeks after your surgery.  You should have been given your post-op/follow-up appointment when your surgery was scheduled.  If you did not receive a post-op/follow-up appointment, make sure that you call for this appointment within a day or two after you arrive home to insure a convenient appointment time. ° °OTHER   INSTRUCTIONS 13. Do not lift push/pull anything greater than 15 lbs for 1 month  WHEN TO CALL YOUR DOCTOR: 1. Fever over 101.0 2. Inability to urinate 3. Continued bleeding from incision. 4. Increased pain, redness, or drainage from the incision. 5. Increasing abdominal pain  The clinic  staff is available to answer your questions during regular business hours.  Please dont hesitate to call and ask to speak to one of the nurses for clinical concerns.  If you have a medical emergency, go to the nearest emergency room or call 911.  A surgeon from [facility] is always on call at the hospital. [address], [place], [place], [place]  [address] ? [address], [place], [place]   [id number]     Managing Your Pain After Surgery Without Opioids    Thank you for participating in our program to help patients manage their pain after surgery without opioids. This is part of our effort to provide you with the best care possible, without exposing you or your family to the risk that opioids pose.  What pain can I expect after surgery? You can expect to have some pain after surgery. This is normal. The pain is typically worse the day after surgery, and quickly begins to get better. Many studies have found that many patients are able to manage their pain after surgery with Over-the-Counter (OTC) medications such as Tylenol and Motrin. If you have a condition that does not allow you to take Tylenol or Motrin, notify your surgical team.  How will I manage my pain? The best strategy for controlling your pain after surgery is around the clock pain control with Tylenol (acetaminophen) and Motrin (ibuprofen or Advil). Alternating these medications with each other allows you to maximize your pain control. In addition to Tylenol and Motrin, you can use heating pads or ice packs on your incisions to help reduce your pain.  How will I alternate your regular strength over-the-counter pain medication? You will take a dose of pain medication every three hours. ; Start by taking 650 mg of Tylenol (2 pills of 325 mg) ; 3 hours later take 600 mg of Motrin (3 pills of 200 mg) ; 3 hours after taking the Motrin take 650 mg of Tylenol ; 3 hours after that take 600 mg of Motrin.   -  1 -  See example - if your first dose of Tylenol is at 12:00 PM   12:00 PM Tylenol 650 mg (2 pills of 325 mg)  3:00 PM Motrin 600 mg (3 pills of 200 mg)  6:00 PM Tylenol 650 mg (2 pills of 325 mg)  9:00 PM Motrin 600 mg (3 pills of 200 mg)  Continue alternating every 3 hours   We recommend that you follow this schedule around-the-clock for at least 3 days after surgery, or until you feel that it is no longer needed. Use the table on the last page of this handout to keep track of the medications you are taking. Important: Do not take more than 3000mg  of Tylenol or 1600mg  of Motrin in a 24-hour period. Do not take ibuprofen/Motrin if you have a history of bleeding stomach ulcers, severe kidney disease, &/or actively taking a blood thinner  What if I still have pain? If you have pain that is not controlled with the over-the-counter pain medications (Tylenol and Motrin or Advil) you might have what we call breakthrough pain. You will receive a prescription for a small amount of an opioid  pain medication such as Oxycodone, Tramadol, or Tylenol with Codeine. Use these opioid pills in the first 24 hours after surgery if you have breakthrough pain. Do not take more than 1 pill every 4-6 hours.  If you still have uncontrolled pain after using all opioid pills, don't hesitate to call our staff using the number provided. We will help make sure you are managing your pain in the best way possible, and if necessary, we can provide a prescription for additional pain medication.   Day 1    Time  Name of Medication Number of pills taken  Amount of Acetaminophen  Pain Level   Comments  AM PM       AM PM       AM PM       AM PM       AM PM       AM PM       AM PM       AM PM       Total Daily amount of Acetaminophen Do not take more than  3,000 mg per day      Day 2    Time  Name of Medication Number of pills taken  Amount of Acetaminophen  Pain Level   Comments  AM PM       AM PM        AM PM       AM PM       AM PM       AM PM       AM PM       AM PM       Total Daily amount of Acetaminophen Do not take more than  3,000 mg per day      Day 3    Time  Name of Medication Number of pills taken  Amount of Acetaminophen  Pain Level   Comments  AM PM       AM PM       AM PM       AM PM          AM PM       AM PM       AM PM       AM PM       Total Daily amount of Acetaminophen Do not take more than  3,000 mg per day      Day 4    Time  Name of Medication Number of pills taken  Amount of Acetaminophen  Pain Level   Comments  AM PM       AM PM       AM PM       AM PM       AM PM       AM PM       AM PM       AM PM       Total Daily amount of Acetaminophen Do not take more than  3,000 mg per day      Day 5    Time  Name of Medication Number of pills taken  Amount of Acetaminophen  Pain Level   Comments  AM PM       AM PM       AM PM       AM PM       AM PM       AM PM  AM PM       AM PM       Total Daily amount of Acetaminophen Do not take more than  3,000 mg per day       Day 6    Time  Name of Medication Number of pills taken  Amount of Acetaminophen  Pain Level  Comments  AM PM       AM PM       AM PM       AM PM       AM PM       AM PM       AM PM       AM PM       Total Daily amount of Acetaminophen Do not take more than  3,000 mg per day      Day 7    Time  Name of Medication Number of pills taken  Amount of Acetaminophen  Pain Level   Comments  AM PM       AM PM       AM PM       AM PM       AM PM       AM PM       AM PM       AM PM       Total Daily amount of Acetaminophen Do not take more than  3,000 mg per day        For additional information about how and where to safely dispose of unused opioid medications - [employer]  Disclaimer: This document contains information and/or instructional materials adapted from [facility] for the typical  patient with your condition. It does not replace medical advice from your health care provider because your experience may differ from that of the typical patient. Talk to your health care provider if you have any questions about this document, your condition or your treatment plan. Adapted from [place]   [phone] ? [phone] ? FAX [phone] Web site: [email or web address]

## 2020-10-01 NOTE — Telephone Encounter (Signed)
See phone note

## 2020-11-11 ENCOUNTER — Telehealth: Payer: Self-pay | Admitting: Gastroenterology

## 2020-11-11 NOTE — Telephone Encounter (Signed)
Hi Dr. Loletha Carrow,   We received a referral for patient to have a colonoscopy. I spoke with the patient and she is requesting a transfer of care to Dr. Tarri Glenn said she prefers a female provider and would need to repeat her colonoscopy before June 30 th and Dr. Tarri Glenn has that availability.    Please advise on scheduling.   Thank you

## 2020-11-11 NOTE — Telephone Encounter (Signed)
Of course, if that is the patient's preference.  - HD 

## 2020-11-11 NOTE — Telephone Encounter (Signed)
Dr. Tarri Glenn,   Please advise on transfer of care request below and advise on approval\Scheduling.   Thank you

## 2020-11-13 ENCOUNTER — Ambulatory Visit: Payer: 59

## 2020-11-13 ENCOUNTER — Other Ambulatory Visit: Payer: Self-pay

## 2020-11-13 VITALS — Ht 63.0 in | Wt 222.0 lb

## 2020-11-13 DIAGNOSIS — Z1211 Encounter for screening for malignant neoplasm of colon: Secondary | ICD-10-CM

## 2020-11-13 MED ORDER — NA SULFATE-K SULFATE-MG SULF 17.5-3.13-1.6 GM/177ML PO SOLN: 1.0000 | Freq: Once | ORAL | 0 refills | Status: AC

## 2020-11-13 NOTE — Progress Notes (Signed)
Pt verified name, DOB, address and insurance during PV today.   Pt mailed instruction packet to included copy of consent form to read and not return, and instructions. PV completed over the phone. Pt encouraged to call with questions or issues.   No allergies to soy or egg Pt is not on blood thinners or diet pills Denies issues with sedation/intubation Denies atrial flutter/fib Denies constipation   Emmi instructions given to pt  Pt is aware of Covid safety and care partner requirements.

## 2020-11-25 ENCOUNTER — Encounter: Payer: Self-pay | Admitting: Gastroenterology

## 2020-11-25 ENCOUNTER — Other Ambulatory Visit: Payer: Self-pay

## 2020-11-25 ENCOUNTER — Ambulatory Visit (AMBULATORY_SURGERY_CENTER): Payer: 59 | Admitting: Gastroenterology

## 2020-11-25 VITALS — BP 105/64 | HR 72 | Temp 98.0°F | Resp 11 | Ht 63.0 in | Wt 222.0 lb

## 2020-11-25 DIAGNOSIS — D120 Benign neoplasm of cecum: Secondary | ICD-10-CM | POA: Diagnosis not present

## 2020-11-25 DIAGNOSIS — Z1211 Encounter for screening for malignant neoplasm of colon: Secondary | ICD-10-CM

## 2020-11-25 DIAGNOSIS — Z800 Family history of malignant neoplasm of digestive organs: Secondary | ICD-10-CM

## 2020-11-25 MED ORDER — SODIUM CHLORIDE 0.9 % IV SOLN: 500.0000 mL | Freq: Once | INTRAVENOUS | Status: DC

## 2020-11-25 NOTE — Op Note (Signed)
[facility] Patient Name: Holly Leach Procedure Date: 11/25/2020 8:28 AM MRN: [id number] Endoscopist: Thornton Park MD, MD Age: 50 Referring MD:  Date of Birth: 1970/11/23 Gender: Female Account #: [id number] Procedure:                Colonoscopy Indications:              Screening for colorectal malignant neoplasm, This                            is the patient's first colonoscopy                           Maternal grandfather may have had colon cancer                           No other known family history of colon cancer or                            polyps Medicines:                Monitored Anesthesia Care Procedure:                Pre-Anesthesia Assessment:                           - Prior to the procedure, a History and Physical                            was performed, and patient medications and                            allergies were reviewed. The patient's tolerance of                            previous anesthesia was also reviewed. The risks                            and benefits of the procedure and the sedation                            options and risks were discussed with the patient.                            All questions were answered, and informed consent                            was obtained. Prior Anticoagulants: The patient has                            taken no previous anticoagulant or antiplatelet                            agents. ASA Grade Assessment: II - A patient with  mild systemic disease. After reviewing the risks                            and benefits, the patient was deemed in                            satisfactory condition to undergo the procedure.                           After obtaining informed consent, the colonoscope                            was passed under direct vision. Throughout the                            procedure, the patient's blood pressure, pulse, and                             oxygen saturations were monitored continuously. The                            Olympus CF-HQ190L [id number]) Colonoscope was                            introduced through the anus and advanced to the 4                            cm into the ileum. A second forward view of the                            right colon was performed. The colonoscopy was                            performed without difficulty. The patient tolerated                            the procedure well. The quality of the bowel                            preparation was good. The terminal ileum, ileocecal                            valve, appendiceal orifice, and rectum were                            photographed. Scope In: 8:39:47 AM Scope Out: 8:55:15 AM Scope Withdrawal Time: 0 hours 9 minutes 58 seconds  Total Procedure Duration: 0 hours 15 minutes 28 seconds  Findings:                 A few small-mouthed diverticula were found in the                            sigmoid colon and descending colon.  A 2 mm polyp was found in the ileocecal valve. The                            polyp was sessile. The polyp was removed with a                            cold snare. Resection and retrieval were complete.                            Estimated blood loss was minimal.                           Non-bleeding external and internal hemorrhoids were                            found.                           The exam was otherwise without abnormality on                            direct and retroflexion views. Complications:            No immediate complications. Estimated blood loss:                            Minimal. Estimated Blood Loss:     Estimated blood loss was minimal. Impression:               - Diverticulosis in the sigmoid colon and in the                            descending colon.                           - One 2 mm polyp at the ileocecal valve, removed                             with a cold snare. Resected and retrieved.                           - Non-bleeding external and internal hemorrhoids.                           - The examination was otherwise normal on direct                            and retroflexion views. Recommendation:           - Patient has a contact number available for                            emergencies. The signs and symptoms of potential                            delayed complications were discussed with  the                            patient. Return to normal activities tomorrow.                            Written discharge instructions were provided to the                            patient.                           - High fiber diet.                           - Continue present medications.                           - Await pathology results.                           - Repeat colonoscopy date to be determined after                            pending pathology results are reviewed for                            surveillance.                           - Emerging evidence supports eating a diet of                            fruits, vegetables, grains, calcium, and yogurt                            while reducing red meat and alcohol may reduce the                            risk of colon cancer.                           - Thank you for allowing me to be involved in your                            colon cancer prevention. Thornton Park MD, MD 11/25/2020 9:00:55 AM This report has been signed electronically.

## 2020-11-25 NOTE — Progress Notes (Signed)
Report to PACU, RN, vss, BBS= Clear.  

## 2020-11-25 NOTE — Progress Notes (Signed)
Pt's states no medical or surgical changes since previsit or office visit.  ° °Vitals CW °

## 2020-11-25 NOTE — Progress Notes (Signed)
Called to room to assist during endoscopic procedure.  Patient ID and intended procedure confirmed with present staff. Received instructions for my participation in the procedure from the performing physician.  

## 2020-11-25 NOTE — Patient Instructions (Signed)
YOU HAD AN ENDOSCOPIC PROCEDURE TODAY AT THE [facility]:   Refer to the procedure report that was given to you for any specific questions about what was found during the examination.  If the procedure report does not answer your questions, please call your gastroenterologist to clarify.  If you requested that your care partner not be given the details of your procedure findings, then the procedure report has been included in a sealed envelope for you to review at your convenience later.  YOU SHOULD EXPECT: Some feelings of bloating in the abdomen. Passage of more gas than usual.  Walking can help get rid of the air that was put into your GI tract during the procedure and reduce the bloating. If you had a lower endoscopy (such as a colonoscopy or flexible sigmoidoscopy) you may notice spotting of blood in your stool or on the toilet paper. If you underwent a bowel prep for your procedure, you may not have a normal bowel movement for a few days.  Please Note:  You might notice some irritation and congestion in your nose or some drainage.  This is from the oxygen used during your procedure.  There is no need for concern and it should clear up in a day or so.  SYMPTOMS TO REPORT IMMEDIATELY:   Following lower endoscopy (colonoscopy or flexible sigmoidoscopy):  Excessive amounts of blood in the stool  Significant tenderness or worsening of abdominal pains  Swelling of the abdomen that is new, acute  Fever of 100F or higher   Following upper endoscopy (EGD)  Vomiting of blood or coffee ground material  New chest pain or pain under the shoulder blades  Painful or persistently difficult swallowing  New shortness of breath  Fever of 100F or higher  Black, tarry-looking stools  For urgent or emergent issues, a gastroenterologist can be reached at any hour by calling [phone]. Do not use MyChart messaging for urgent concerns.    DIET:  We do recommend a small meal at first, but  then you may proceed to your regular diet.  Drink plenty of fluids but you should avoid alcoholic beverages for 24 hours.  ACTIVITY:  You should plan to take it easy for the rest of today and you should NOT DRIVE or use heavy machinery until tomorrow (because of the sedation medicines used during the test).    FOLLOW UP: Our staff will call the number listed on your records 48-72 hours following your procedure to check on you and address any questions or concerns that you may have regarding the information given to you following your procedure. If we do not reach you, we will leave a message.  We will attempt to reach you two times.  During this call, we will ask if you have developed any symptoms of COVID 19. If you develop any symptoms (ie: fever, flu-like symptoms, shortness of breath, cough etc.) before then, please call [phone].  If you test positive for Covid 19 in the 2 weeks post procedure, please call and report this information to us.    If any biopsies were taken you will be contacted by phone or by letter within the next 1-3 weeks.  Please call us at [phone] if you have not heard about the biopsies in 3 weeks.    SIGNATURES/CONFIDENTIALITY: You and/or your care partner have signed paperwork which will be entered into your electronic medical record.  These signatures attest to the fact that that the information above on   your After Visit Summary has been reviewed and is understood.  Full responsibility of the confidentiality of this discharge information lies with you and/or your care-partner. 

## 2020-11-27 ENCOUNTER — Telehealth: Payer: Self-pay

## 2020-11-27 NOTE — Telephone Encounter (Signed)
  Follow up Call-  Call back number 11/25/2020  Post procedure Call Back phone  # [phone]  Permission to leave phone message Yes  Some recent data might be hidden     Patient questions:  Do you have a fever, pain , or abdominal swelling? No. Pain Score  0 *  Have you tolerated food without any problems? Yes.    Have you been able to return to your normal activities? Yes.    Do you have any questions about your discharge instructions: Diet   No. Medications  No. Follow up visit  No.  Do you have questions or concerns about your Care? No.  Actions: * If pain score is 4 or above: No action needed, pain <4.

## 2020-11-27 NOTE — Telephone Encounter (Signed)
Left message on follow up call. 

## 2020-12-02 ENCOUNTER — Encounter: Payer: Self-pay | Admitting: Gastroenterology

## 2021-02-24 DIAGNOSIS — N8500 Endometrial hyperplasia, unspecified: Secondary | ICD-10-CM | POA: Diagnosis not present

## 2021-06-01 DIAGNOSIS — S83511A Sprain of anterior cruciate ligament of right knee, initial encounter: Secondary | ICD-10-CM

## 2021-06-01 HISTORY — DX: Sprain of anterior cruciate ligament of right knee, initial encounter: S83.511A

## 2021-07-02 DIAGNOSIS — Z87448 Personal history of other diseases of urinary system: Secondary | ICD-10-CM

## 2021-07-02 HISTORY — DX: Personal history of other diseases of urinary system: Z87.448

## 2021-07-18 ENCOUNTER — Emergency Department
Admission: EM | Admit: 2021-07-18 | Discharge: 2021-07-18 | Disposition: A | Discharge status: Home / Self Care | Payer: 59 | Source: Home / Self Care

## 2021-07-18 ENCOUNTER — Encounter: Payer: Self-pay | Admitting: Emergency Medicine

## 2021-07-18 ENCOUNTER — Other Ambulatory Visit: Payer: Self-pay

## 2021-07-18 DIAGNOSIS — N12 Tubulo-interstitial nephritis, not specified as acute or chronic: Secondary | ICD-10-CM

## 2021-07-18 HISTORY — DX: Obstructive sleep apnea (adult) (pediatric): G47.33

## 2021-07-18 LAB — POCT URINALYSIS DIP (MANUAL ENTRY)
Bilirubin, UA: NEGATIVE
Glucose, UA: NEGATIVE mg/dL
Ketones, POC UA: NEGATIVE mg/dL
Nitrite, UA: NEGATIVE
Protein Ur, POC: NEGATIVE mg/dL
Spec Grav, UA: >=1.03 — AB (ref 1.010–1.025)
Urobilinogen, UA: 0.2 E.U./dL
pH, UA: 6 (ref 5.0–8.0)

## 2021-07-18 MED ORDER — SULFAMETHOXAZOLE-TRIMETHOPRIM 800-160 MG PO TABS: 1.0000 | ORAL_TABLET | Freq: Two times a day (BID) | ORAL | 0 refills | Status: AC

## 2021-07-18 MED ORDER — CEFTRIAXONE SODIUM 1 G IJ SOLR
1000.0000 mg | Freq: Once | INTRAMUSCULAR | Status: AC
  Administered 2021-07-18: 1000 mg via INTRAMUSCULAR

## 2021-07-18 NOTE — ED Provider Notes (Signed)
[facility]    CSN: [id number] Arrival date & time: 07/18/21  0806      History   Chief Complaint Chief Complaint  Patient presents with   Flank Pain    right    HPI Holly Leach is a 51 y.o. female.   Patient presents with concerns of possible UTI or kidney infection. The patient reports back pain since about Wednesday and developed dysuria, frequency, and some hematuria yesterday. She denies nausea, vomiting, fever. The patient reports past UTIs but it has been a long time. She has not taken anything for her symptoms except ibuprofen.  The patient reports allergy to Augmentin - upon further discussion she states she is able to amoxicillin without any problems. She states when she took Augmentin in the past it caused a severe yeast infection but denies any rash, angioedema, or other symptoms consistent with allergic reaction.   The history is provided by the patient.  Flank Pain Pertinent negatives include no abdominal pain, no headaches and no shortness of breath.   Past Medical History:  Diagnosis Date   Chronic headaches    Hyperlipidemia    Hypertension    OSA on CPAP    PE (pulmonary embolism) 2006   Pre-diabetes    Thyroid disease    Tuberculosis    as a child but no problems now    Patient Active Problem List   Diagnosis Date Noted   Neck mass 11/03/2013   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/03/2013    Past Surgical History:  Procedure Laterality Date   COLONOSCOPY     FRACTURE SURGERY Left 2020   foot    HERNIA REPAIR      OB History   No obstetric history on file.      Home Medications    Prior to Admission medications   Medication Sig Start Date End Date Taking? Authorizing Provider  methocarbamol (ROBAXIN) 500 MG tablet Take 500 mg by mouth 4 (four) times daily.   Yes [provider]  sulfamethoxazole-trimethoprim (BACTRIM DS) 800-160 MG tablet Take 1 tablet by mouth 2 (two) times daily for 7 days. 07/18/21 07/25/21 Yes Aubriauna Riner L, PA   Bacillus Coagulans-Inulin (PROBIOTIC-PREBIOTIC PO) Take by mouth. Patient not taking: Reported on 07/18/2021    [provider]  buPROPion (WELLBUTRIN SR) 100 MG 12 hr tablet Take 100 mg by mouth every morning. Patient not taking: Reported on 07/18/2021 11/07/20   [provider]  COLLAGEN PO Take 1 tablet by mouth daily.    [provider]  fluticasone (FLONASE) 50 MCG/ACT nasal spray Place 2 sprays into both nostrils daily as needed for allergies.    [provider]  gabapentin (NEURONTIN) 300 MG capsule Take 1 capsule (300 mg total) by mouth 3 (three) times daily as needed for up to 15 days (abdominal pain). Patient not taking: Reported on 07/18/2021 07/09/20 11/13/20  Greer Pickerel, MD  ibuprofen (ADVIL) 600 MG tablet Take 600 mg by mouth 3 (three) times daily. Patient not taking: Reported on 07/18/2021 08/14/20   [provider]  LESSINA-28 0.1-20 MG-MCG tablet Take by mouth. 10/29/20   [provider]  Levothyroxine Sodium 25 MCG CAPS Take 25 mcg by mouth daily before breakfast.    [provider]  lisinopril-hydrochlorothiazide (ZESTORETIC) 20-25 MG tablet Take 1 tablet by mouth 2 (two) times daily.    [provider]  loratadine (CLARITIN) 10 MG tablet Take 10 mg by mouth daily.    [provider]  Melatonin 10  MG TABS Take 10 mg by mouth at bedtime.    [provider]  meloxicam (MOBIC) 15 MG tablet Take 15 mg by mouth daily. 07/13/21   [provider]  naltrexone (DEPADE) 50 MG tablet Take 50 mg by mouth daily. Patient not taking: Reported on 07/18/2021 11/07/20   [provider]  OVER THE COUNTER MEDICATION Take 2 capsules by mouth daily. Immun[employer] Patient not taking: No sig reported    [provider]  OVER THE COUNTER MEDICATION Take 2 capsules by mouth daily. [email or web address] [id number] Patient not taking: No sig reported    [provider]  oxyCODONE (OXY IR/ROXICODONE) 5 MG  immediate release tablet Take 1 tablet (5 mg total) by mouth every 6 (six) hours as needed for severe pain. Patient not taking: No sig reported 07/09/20   Greer Pickerel, MD    Family History Family History  Problem Relation Age of Onset   Hypertension Mother    Stroke Mother    Hypertension Father    Heart disease Father    Irritable bowel syndrome Sister    Hypertension Brother    Hyperlipidemia Brother    Stomach cancer Maternal Grandfather    Diabetes Paternal Grandmother    Irritable bowel syndrome Maternal Aunt    Colon cancer Neg Hx    Colon polyps Neg Hx    Esophageal cancer Neg Hx    Rectal cancer Neg Hx     Social History Social History   Tobacco Use   Smoking status: Never   Smokeless tobacco: Never  Vaping Use   Vaping Use: Never used  Substance Use Topics   Alcohol use: No    Alcohol/week: 0.0 standard drinks   Drug use: No     Allergies   Augmentin [amoxicillin-pot clavulanate]   Review of Systems Review of Systems  Constitutional:  Negative for fatigue and fever.  Respiratory:  Negative for shortness of breath.   Gastrointestinal:  Negative for abdominal pain, nausea and vomiting.  Genitourinary:  Positive for dysuria, flank pain, frequency and hematuria. Negative for difficulty urinating and vaginal discharge.  Musculoskeletal:  Positive for back pain.  Skin:  Negative for rash.  Neurological:  Negative for dizziness and headaches.    Physical Exam Triage Vital Signs ED Triage Vitals  Enc Vitals Group     BP 07/18/21 0824 (!) 157/75     Pulse Rate 07/18/21 0824 (!) 110     Resp 07/18/21 0824 15     Temp 07/18/21 0824 98.7 F (37.1 C)     Temp Source 07/18/21 0824 Oral     SpO2 07/18/21 0824 97 %     Weight 07/18/21 0828 210 lb (95.3 kg)     Height 07/18/21 0828 5\' 3"  (1.6 m)     Head Circumference --      Peak Flow --      Pain Score 07/18/21 0827 4     Pain Loc --      Pain Edu? --      Excl. in [place]? --    No data found.  Updated  Vital Signs BP (!) 157/75 (BP Location: Left Arm)    Pulse (!) 110    Temp 98.7 F (37.1 C) (Oral)    Resp 15    Ht 5\' 3"  (1.6 m)    Wt 210 lb (95.3 kg)    SpO2 97%    BMI 37.20 kg/m   Visual Acuity Right Eye Distance:   Left Eye Distance:  Bilateral Distance:    Right Eye Near:   Left Eye Near:    Bilateral Near:     Physical Exam Vitals and nursing note reviewed.  Constitutional:      General: She is not in acute distress. Eyes:     Pupils: Pupils are equal, round, and reactive to light.  Cardiovascular:     Rate and Rhythm: Normal rate and regular rhythm.     Heart sounds: Normal heart sounds.  Pulmonary:     Effort: Pulmonary effort is normal.     Breath sounds: Normal breath sounds.  Abdominal:     Palpations: Abdomen is soft.     Tenderness: There is abdominal tenderness (mild suprapubic tenderness). There is right CVA tenderness. There is no left CVA tenderness, guarding or rebound.  Skin:    General: Skin is warm.  Neurological:     Mental Status: She is alert.  Psychiatric:        Mood and Affect: Mood normal.     UC Treatments / Results  Labs (all labs ordered are listed, but only abnormal results are displayed) Labs Reviewed  POCT URINALYSIS DIP (MANUAL ENTRY) - Abnormal; Notable for the following components:      Result Value   Clarity, UA cloudy (*)    Spec Grav, UA >=1.030 (*)    Blood, UA trace-intact (*)    Leukocytes, UA Large (3+) (*)    All other components within normal limits  URINE CULTURE    EKG   Radiology No results found.  Procedures Procedures (including critical care time)  Medications Ordered in UC Medications  cefTRIAXone (ROCEPHIN) injection 1,000 mg (1,000 mg Intramuscular Given 07/18/21 0900)    Initial Impression / Assessment and Plan / UC Course  I have reviewed the triage vital signs and the nursing notes.  Pertinent labs & imaging results that were available during my care of the patient were reviewed by me and  considered in my medical decision making (see chart for details).     S/s of UTI along with CVA tenderness and leuk on U/A - will treat as early pyelo with IM Rocephin and PO Bactrim. No indication for needing inpt care at this time. Discussed ER precautions and close PCP f/u - she has appt for Monday already.   E/M: 1 acute complicated illness, 2 data (UA, Ucx), moderate risk due to prescription management  Final Clinical Impressions(s) / UC Diagnoses   Final diagnoses:  Pyelonephritis     Discharge Instructions      Antibiotic injection (Rocephin) given today in clinic. Take antibiotics (Bactrim) as prescribed. Keep hydrated. Follow-up with PCP as scheduled Monday. Go to the ER if develop worsening back pain or new concerning symptoms such as vomiting or fever.     ED Prescriptions     Medication Sig Dispense Auth. Provider   sulfamethoxazole-trimethoprim (BACTRIM DS) 800-160 MG tablet Take 1 tablet by mouth 2 (two) times daily for 7 days. 14 tablet Abner Greenspan, Akoni Parton L, [place]      PDMP not reviewed this encounter.   Delsa Sale, [place] 07/18/21 [phone]

## 2021-07-18 NOTE — Discharge Instructions (Addendum)
Antibiotic injection (Rocephin) given today in clinic. Take antibiotics (Bactrim) as prescribed. Keep hydrated. Follow-up with PCP as scheduled Monday. Go to the ER if develop worsening back pain or new concerning symptoms such as vomiting or fever.

## 2021-07-18 NOTE — ED Triage Notes (Signed)
Dysuria & hematuria since Tuesday  R flank pain

## 2021-07-19 LAB — URINE CULTURE
MICRO NUMBER:: 13024248
SPECIMEN QUALITY:: ADEQUATE

## 2021-11-28 ENCOUNTER — Other Ambulatory Visit: Payer: Self-pay | Admitting: Family Medicine

## 2021-11-28 DIAGNOSIS — M25561 Pain in right knee: Secondary | ICD-10-CM

## 2021-12-09 ENCOUNTER — Ambulatory Visit
Admission: RE | Admit: 2021-12-09 | Discharge: 2021-12-09 | Disposition: A | Discharge status: Home / Self Care | Payer: BC Managed Care – PPO | Source: Ambulatory Visit | Attending: Family Medicine | Admitting: Family Medicine

## 2021-12-09 DIAGNOSIS — M25561 Pain in right knee: Secondary | ICD-10-CM

## 2022-02-18 ENCOUNTER — Ambulatory Visit
Admission: RE | Admit: 2022-02-18 | Discharge: 2022-02-18 | Disposition: A | Discharge status: Home / Self Care | Payer: BC Managed Care – PPO | Source: Ambulatory Visit | Attending: Family Medicine | Admitting: Family Medicine

## 2022-02-18 VITALS — BP 138/82 | HR 85 | Temp 97.7°F | Resp 17

## 2022-02-18 DIAGNOSIS — R300 Dysuria: Secondary | ICD-10-CM

## 2022-02-18 DIAGNOSIS — N10 Acute tubulo-interstitial nephritis: Secondary | ICD-10-CM

## 2022-02-18 LAB — POCT URINALYSIS DIP (MANUAL ENTRY)
Bilirubin, UA: NEGATIVE
Glucose, UA: NEGATIVE mg/dL
Ketones, POC UA: NEGATIVE mg/dL
Nitrite, UA: NEGATIVE
Protein Ur, POC: NEGATIVE mg/dL
Spec Grav, UA: 1.02 (ref 1.010–1.025)
Urobilinogen, UA: 0.2 E.U./dL
pH, UA: 7 (ref 5.0–8.0)

## 2022-02-18 MED ORDER — CEFTRIAXONE SODIUM 1 G IJ SOLR: 1000.0000 mg | Freq: Once | INTRAMUSCULAR | Status: DC

## 2022-02-18 MED ORDER — CIPROFLOXACIN HCL 500 MG PO TABS: 500.0000 mg | ORAL_TABLET | Freq: Two times a day (BID) | ORAL | 0 refills | Status: DC

## 2022-02-18 MED ORDER — OXYCODONE-ACETAMINOPHEN 5-325 MG PO TABS: 1.0000 | ORAL_TABLET | Freq: Four times a day (QID) | ORAL | 0 refills | Status: DC | PRN

## 2022-02-18 NOTE — ED Provider Notes (Signed)
[facility]    CSN: [id number] Arrival date & time: 02/18/22  0801      History   Chief Complaint Chief Complaint  Patient presents with   Back Pain    HPI Holly Leach is a 51 y.o. female.   HPI Patient is here for back pain.  She states she has had severe back since Sunday.  She states that she has tried Mobic and Tylenol at home.  These have not helped.  In addition she has dysuria.  Last night she had some sweats and chills.  She is concerned for kidney infection.  She had acute pyelonephritis In February 2023.  She denies nausea and vomiting She denies back injury or overuse She denies ongoing back problems She works Investment banker, corporate to accompany Past Medical History:  Diagnosis Date   Chronic headaches    Hyperlipidemia    Hypertension    OSA on CPAP    PE (pulmonary embolism) 2006   Pre-diabetes    Thyroid disease    Tuberculosis    as a child but no problems now    Patient Active Problem List   Diagnosis Date Noted   Neck mass 11/03/2013   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/03/2013    Past Surgical History:  Procedure Laterality Date   COLONOSCOPY     FRACTURE SURGERY Left 2020   foot    HERNIA REPAIR      OB History   No obstetric history on file.      Home Medications    Prior to Admission medications   Medication Sig Start Date End Date Taking? Authorizing Provider  ciprofloxacin (CIPRO) 500 MG tablet Take 1 tablet (500 mg total) by mouth 2 (two) times daily. 02/18/22  Yes Raylene Everts, MD  oxyCODONE-acetaminophen (PERCOCET/ROXICET) 5-325 MG tablet Take 1 tablet by mouth every 6 (six) hours as needed for severe pain. 02/18/22  Yes Raylene Everts, MD  carvedilol (COREG) 3.125 MG tablet Take 3.125 mg by mouth 2 (two) times daily. 02/10/22   [provider]  COLLAGEN PO Take 1 tablet by mouth daily.    [provider]  fluticasone (FLONASE) 50 MCG/ACT nasal spray Place 2 sprays into both nostrils daily as needed for  allergies.    [provider]  LESSINA-28 0.1-20 MG-MCG tablet Take by mouth. 10/29/20   [provider]  Levothyroxine Sodium 25 MCG CAPS Take 25 mcg by mouth daily before breakfast.    [provider]  lisinopril-hydrochlorothiazide (ZESTORETIC) 20-25 MG tablet Take 1 tablet by mouth 2 (two) times daily.    [provider]  loratadine (CLARITIN) 10 MG tablet Take 10 mg by mouth daily.    [provider]  Melatonin 10 MG TABS Take 10 mg by mouth at bedtime.    [provider]  meloxicam (MOBIC) 15 MG tablet Take 15 mg by mouth daily. 07/13/21   [provider]    Family History Family History  Problem Relation Age of Onset   Hypertension Mother    Stroke Mother    Hypertension Father    Heart disease Father    Irritable bowel syndrome Sister    Hypertension Brother    Hyperlipidemia Brother    Stomach cancer Maternal Grandfather    Diabetes Paternal Grandmother    Irritable bowel syndrome Maternal Aunt    Colon cancer Neg Hx    Colon polyps Neg Hx    Esophageal cancer Neg Hx    Rectal cancer Neg Hx  Social History Social History   Tobacco Use   Smoking status: Never   Smokeless tobacco: Never  Vaping Use   Vaping Use: Never used  Substance Use Topics   Alcohol use: No    Alcohol/week: 0.0 standard drinks of alcohol   Drug use: No     Allergies   Augmentin [amoxicillin-pot clavulanate]   Review of Systems Review of Systems See HPI  Physical Exam Triage Vital Signs ED Triage Vitals  Enc Vitals Group     BP 02/18/22 0825 138/82     Pulse Rate 02/18/22 0825 85     Resp 02/18/22 0825 17     Temp 02/18/22 0825 97.7 F (36.5 C)     Temp Source 02/18/22 0825 Oral     SpO2 02/18/22 0825 96 %     Weight --      Height --      Head Circumference --      Peak Flow --      Pain Score 02/18/22 0823 7     Pain Loc --      Pain Edu? --      Excl. in [place]? --    No data found.  Updated Vital  Signs BP 138/82 (BP Location: Left Arm)   Pulse 85   Temp 97.7 F (36.5 C) (Oral)   Resp 17   SpO2 96%     Physical Exam Constitutional:      General: She is not in acute distress.    Appearance: She is well-developed.     Comments: Overweight.  Appears uncomfortable.  Guarded movements  HENT:     Head: Normocephalic and atraumatic.  Eyes:     Conjunctiva/sclera: Conjunctivae normal.     Pupils: Pupils are equal, round, and reactive to light.  Cardiovascular:     Rate and Rhythm: Normal rate and regular rhythm.     Heart sounds: Normal heart sounds.  Pulmonary:     Effort: Pulmonary effort is normal. No respiratory distress.     Breath sounds: No wheezing or rales.  Abdominal:     General: There is no distension.     Palpations: Abdomen is soft.     Tenderness: There is abdominal tenderness. There is no left CVA tenderness.     Comments: Patient has tenderness to palpation of the epigastrium and mid abdomen.  Tenderness over both kidney areas of the flank.  No lower abdominal tenderness.  No organomegaly  Musculoskeletal:        General: Tenderness present. Normal range of motion.     Cervical back: Normal range of motion.     Comments: Tenderness in the L5-S1 region and over both SI joints.  No palpable muscle spasm.  Neuro exam normal in lower extremities  Skin:    General: Skin is warm and dry.  Neurological:     General: No focal deficit present.     Mental Status: She is alert.     Gait: Gait normal.  Psychiatric:        Mood and Affect: Mood normal.        Behavior: Behavior normal.      UC Treatments / Results  Labs (all labs ordered are listed, but only abnormal results are displayed) Labs Reviewed  POCT URINALYSIS DIP (MANUAL ENTRY) - Abnormal; Notable for the following components:      Result Value   Blood, UA trace-intact (*)    Leukocytes, UA Small (1+) (*)    All other components  within normal limits  URINE CULTURE    EKG   Radiology No  results found.  Procedures Procedures (including critical care time)  Medications Ordered in UC Medications  cefTRIAXone (ROCEPHIN) injection 1,000 mg (has no administration in time range)    Initial Impression / Assessment and Plan / UC Course  I have reviewed the triage vital signs and the nursing notes.  Pertinent labs & imaging results that were available during my care of the patient were reviewed by me and considered in my medical decision making (see chart for details).     The tenderness in the patient's back appears to be more musculoligamentous.  Patient does however have abnormal urinalysis, fever and chills, and a history of pyelonephritis.  We will treat for pyelonephritis pending urine culture.  Is told to follow-up with her primary care doctor Final Clinical Impressions(s) / UC Diagnoses   Final diagnoses:  Dysuria  Acute pyelonephritis     Discharge Instructions      Take the cipro antibiotic 2 x a day for 7 days Drink lots of water Take OTC tylenol, ibuprofen or aleve for moderate pain - or  your mobic Take oxycodone for severe pain Call for problems Your urine culture report will be available in 2-3 days on My Chart   ED Prescriptions     Medication Sig Dispense Auth. Provider   ciprofloxacin (CIPRO) 500 MG tablet Take 1 tablet (500 mg total) by mouth 2 (two) times daily. 14 tablet Raylene Everts, MD   oxyCODONE-acetaminophen (PERCOCET/ROXICET) 5-325 MG tablet Take 1 tablet by mouth every 6 (six) hours as needed for severe pain. 10 tablet Raylene Everts, MD      I have reviewed the PDMP during this encounter.   Raylene Everts, MD 02/18/22 [phone]

## 2022-02-18 NOTE — Discharge Instructions (Signed)
Take the cipro antibiotic 2 x a day for 7 days Drink lots of water Take OTC tylenol, ibuprofen or aleve for moderate pain - or  your mobic Take oxycodone for severe pain Call for problems Your urine culture report will be available in 2-3 days on My Chart

## 2022-02-18 NOTE — ED Triage Notes (Signed)
Pt c/o severe back pain since Sunday. Denies injury. Slight dysuria. Seen last year for UTI.

## 2022-02-19 ENCOUNTER — Telehealth: Payer: Self-pay | Admitting: Emergency Medicine

## 2022-02-19 LAB — URINE CULTURE: Culture: >=100000 — AB

## 2022-02-19 NOTE — Telephone Encounter (Signed)
Called left msg, hope she is feeling better. Patient called back she is still having pain, I advised her she would need to give the meds a couple of days before she sees any change. She acknowledged understanding

## 2022-05-20 ENCOUNTER — Other Ambulatory Visit: Payer: Self-pay | Admitting: Obstetrics and Gynecology

## 2022-05-20 DIAGNOSIS — R928 Other abnormal and inconclusive findings on diagnostic imaging of breast: Secondary | ICD-10-CM

## 2022-06-02 ENCOUNTER — Other Ambulatory Visit: Payer: BC Managed Care – PPO

## 2022-06-12 ENCOUNTER — Ambulatory Visit
Admission: RE | Admit: 2022-06-12 | Discharge: 2022-06-12 | Disposition: A | Discharge status: Home / Self Care | Payer: BC Managed Care – PPO | Source: Ambulatory Visit | Attending: Obstetrics and Gynecology | Admitting: Obstetrics and Gynecology

## 2022-06-12 ENCOUNTER — Other Ambulatory Visit: Payer: Self-pay | Admitting: Obstetrics and Gynecology

## 2022-06-12 DIAGNOSIS — N6320 Unspecified lump in the left breast, unspecified quadrant: Secondary | ICD-10-CM

## 2022-06-12 DIAGNOSIS — R928 Other abnormal and inconclusive findings on diagnostic imaging of breast: Secondary | ICD-10-CM

## 2022-06-16 ENCOUNTER — Other Ambulatory Visit: Payer: BC Managed Care – PPO

## 2022-06-19 ENCOUNTER — Ambulatory Visit
Admission: RE | Admit: 2022-06-19 | Discharge: 2022-06-19 | Disposition: A | Discharge status: Home / Self Care | Payer: BC Managed Care – PPO | Source: Ambulatory Visit | Attending: Obstetrics and Gynecology | Admitting: Obstetrics and Gynecology

## 2022-06-19 DIAGNOSIS — N6320 Unspecified lump in the left breast, unspecified quadrant: Secondary | ICD-10-CM

## 2022-06-19 HISTORY — PX: BREAST BIOPSY: SHX20

## 2022-06-23 ENCOUNTER — Other Ambulatory Visit: Payer: Self-pay | Admitting: General Surgery

## 2022-06-23 DIAGNOSIS — C50412 Malignant neoplasm of upper-outer quadrant of left female breast: Secondary | ICD-10-CM

## 2022-06-24 ENCOUNTER — Encounter: Payer: Self-pay | Admitting: *Deleted

## 2022-06-24 ENCOUNTER — Other Ambulatory Visit: Payer: Self-pay | Admitting: *Deleted

## 2022-06-24 ENCOUNTER — Other Ambulatory Visit: Payer: Self-pay | Admitting: General Surgery

## 2022-06-24 DIAGNOSIS — C50412 Malignant neoplasm of upper-outer quadrant of left female breast: Secondary | ICD-10-CM

## 2022-06-24 DIAGNOSIS — Z170 Estrogen receptor positive status [ER+]: Secondary | ICD-10-CM | POA: Insufficient documentation

## 2022-06-24 NOTE — Progress Notes (Signed)
Reached out to [employer] to introduce myself as the office RN Navigator and explain our new patient process. Reviewed the reason for their referral and scheduled their new patient appointment along with labs. Provided address and directions to the office including call back phone number. Reviewed with patient any concerns they may have or any possible barriers to attending their appointment.   Informed patient about my role as a navigator and that I will meet with them prior to their New Patient appointment and more fully discuss what services I can provide. At this time patient has no further questions or needs.    Oncology Nurse Navigator Documentation     06/24/2022   10:30 AM  Oncology Nurse Navigator Flowsheets  Abnormal Finding Date 06/12/2022  Confirmed Diagnosis Date 06/19/2022  Navigator Follow Up Date: 06/26/2022  Navigator Follow Up Reason: New Patient Appointment  Navigator Location [facility]  Navigator Encounter Type Introductory Phone Call  Patient Visit Type MedOnc  Treatment Phase Pre-Tx/Tx Discussion  Barriers/Navigation Needs Coordination of Care;Education  Education Other  Interventions Coordination of Care;Education  Acuity Level 2-Minimal Needs (1-2 Barriers Identified)  Coordination of Care Appts  Education Method Verbal;Teach-back  Support Groups/Services Friends and Family  Time Spent with Patient 30

## 2022-06-25 NOTE — Progress Notes (Addendum)
Location of Breast Cancer: Malignant neoplasm of upper-outer quadrant of left breast in female, estrogen receptor positive   Histology per Pathology Report:     Receptor Status:    Did patient present with symptoms (if so, please note symptoms) or was this found on screening mammography?: routine mammogram  Past/Anticipated interventions by surgeon, if any: Dr. Barry Dienes 06-23-22 Assessment and Plan:   ICD-10-CM  1. Malignant neoplasm of upper-outer quadrant of left female breast, unspecified estrogen receptor status (CMS-HCC) C50.412 Ambulatory Referral to Oncology-Medical  Ambulatory Referral to Radiation Oncology  Ambulatory Referral to Physical Therapy    Patient has a new diagnosis of left breast cancer, clinical T1c N0. I do not yet have her prognostic panel. I discussed that if this is hormone negative or HER2 positive that she would definitely be recommended to receive chemotherapy.  I discussed that in general treatment would consist of lumpectomy with sentinel lymph node biopsy, radiation, and additional treatment to be determined by Dr. Earley Favor.  The surgical procedure was described to the patient. I discussed the incision type and location and that we will need radiology involved with a seed marker. I discussed block placement.   We discussed the risks bleeding, infection, damage to other structures, need for further procedures/surgeries. We discussed the risk of seroma. The patient was advised if the breast has cancer, we may need to go back to surgery for additional tissue to obtain negative margins or for a lymph node biopsy. The patient was advised that these are the most common complications, but that others can occur as well. I discussed the risk of alteration in breast contour or size. I discussed risk of chronic pain. There are rare instances of heart/lung issues post op as well as blood clots.   They were advised against taking aspirin or other anti-inflammatory  agents/blood thinners the week before surgery.   The risks and benefits of the procedure were described to the patient and she wishes to proceed.   I have placed referrals for medical and radiation oncology. I have also placed a referral to physical therapy and given a prescription for second to nature.  No follow-ups on file.  Milus Height, MD FACS    Past/Anticipated interventions by medical oncology, if any:  Dr. Marin Olp on 06-26-22  Lymphedema issues, if any:  none  Pain issues, if any:  none at this time  SAFETY ISSUES: Prior radiation? none Pacemaker/ICD? none Possible current pregnancy?none Is the patient on methotrexate? none  Current Complaints / other details: Lumpectomy with Lymph node removal on 07-08-22, how does on radiation work, and timeline of radiation as well   Vitals:   06/30/22 0932  BP: 107/85  Pulse: 95  Resp: 18  Temp: (!) 96.7 F (35.9 C)  SpO2: 97%

## 2022-06-26 ENCOUNTER — Encounter: Payer: Self-pay | Admitting: Hematology & Oncology

## 2022-06-26 ENCOUNTER — Inpatient Hospital Stay: Payer: BC Managed Care – PPO | Attending: Hematology & Oncology

## 2022-06-26 ENCOUNTER — Encounter: Payer: Self-pay | Admitting: *Deleted

## 2022-06-26 ENCOUNTER — Inpatient Hospital Stay: Payer: BC Managed Care – PPO | Admitting: Hematology & Oncology

## 2022-06-26 VITALS — BP 135/87 | HR 84 | Temp 98.4°F | Resp 20 | Ht 63.0 in | Wt 228.1 lb

## 2022-06-26 DIAGNOSIS — E785 Hyperlipidemia, unspecified: Secondary | ICD-10-CM | POA: Diagnosis not present

## 2022-06-26 DIAGNOSIS — I10 Essential (primary) hypertension: Secondary | ICD-10-CM | POA: Insufficient documentation

## 2022-06-26 DIAGNOSIS — Z79899 Other long term (current) drug therapy: Secondary | ICD-10-CM | POA: Diagnosis not present

## 2022-06-26 DIAGNOSIS — Z803 Family history of malignant neoplasm of breast: Secondary | ICD-10-CM | POA: Diagnosis not present

## 2022-06-26 DIAGNOSIS — Z170 Estrogen receptor positive status [ER+]: Secondary | ICD-10-CM | POA: Insufficient documentation

## 2022-06-26 DIAGNOSIS — G4733 Obstructive sleep apnea (adult) (pediatric): Secondary | ICD-10-CM | POA: Insufficient documentation

## 2022-06-26 DIAGNOSIS — C50412 Malignant neoplasm of upper-outer quadrant of left female breast: Secondary | ICD-10-CM

## 2022-06-26 DIAGNOSIS — Z7985 Long-term (current) use of injectable non-insulin antidiabetic drugs: Secondary | ICD-10-CM | POA: Insufficient documentation

## 2022-06-26 DIAGNOSIS — Z86711 Personal history of pulmonary embolism: Secondary | ICD-10-CM | POA: Diagnosis not present

## 2022-06-26 DIAGNOSIS — E119 Type 2 diabetes mellitus without complications: Secondary | ICD-10-CM | POA: Diagnosis not present

## 2022-06-26 DIAGNOSIS — C50912 Malignant neoplasm of unspecified site of left female breast: Secondary | ICD-10-CM | POA: Diagnosis present

## 2022-06-26 LAB — CBC WITH DIFFERENTIAL (CANCER CENTER ONLY)
Abs Immature Granulocytes: 0.06 10*3/uL (ref 0.00–0.07)
Basophils Absolute: 0 10*3/uL (ref 0.0–0.1)
Basophils Relative: 1 %
Eosinophils Absolute: 0.2 10*3/uL (ref 0.0–0.5)
Eosinophils Relative: 3 %
HCT: 34.8 % — ABNORMAL LOW (ref 36.0–46.0)
Hemoglobin: 11.2 g/dL — ABNORMAL LOW (ref 12.0–15.0)
Immature Granulocytes: 1 %
Lymphocytes Relative: 23 %
Lymphs Abs: 1.5 10*3/uL (ref 0.7–4.0)
MCH: 27.5 pg (ref 26.0–34.0)
MCHC: 32.2 g/dL (ref 30.0–36.0)
MCV: 85.5 fL (ref 80.0–100.0)
Monocytes Absolute: 0.5 10*3/uL (ref 0.1–1.0)
Monocytes Relative: 7 %
Neutro Abs: 4.4 10*3/uL (ref 1.7–7.7)
Neutrophils Relative %: 65 %
Platelet Count: 241 10*3/uL (ref 150–400)
RBC: 4.07 MIL/uL (ref 3.87–5.11)
RDW: 14.6 % (ref 11.5–15.5)
WBC Count: 6.6 10*3/uL (ref 4.0–10.5)
nRBC: 0 % (ref 0.0–0.2)

## 2022-06-26 LAB — CMP (CANCER CENTER ONLY)
ALT: 87 U/L — ABNORMAL HIGH (ref 0–44)
AST: 63 U/L — ABNORMAL HIGH (ref 15–41)
Albumin: 4.3 g/dL (ref 3.5–5.0)
Alkaline Phosphatase: 106 U/L (ref 38–126)
Anion gap: 9 (ref 5–15)
BUN: 20 mg/dL (ref 6–20)
CO2: 27 mmol/L (ref 22–32)
Calcium: 9.8 mg/dL (ref 8.9–10.3)
Chloride: 103 mmol/L (ref 98–111)
Creatinine: 1.08 mg/dL — ABNORMAL HIGH (ref 0.44–1.00)
GFR, Estimated: >60 mL/min (ref >60)
Glucose, Bld: 100 mg/dL — ABNORMAL HIGH (ref 70–99)
Potassium: 4.4 mmol/L (ref 3.5–5.1)
Sodium: 139 mmol/L (ref 135–145)
Total Bilirubin: 0.4 mg/dL (ref 0.3–1.2)
Total Protein: 7.5 g/dL (ref 6.5–8.1)

## 2022-06-26 LAB — LACTATE DEHYDROGENASE: LDH: 163 U/L (ref 98–192)

## 2022-06-26 NOTE — Progress Notes (Signed)
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  : I now have breast cancer.  HPI: Holly Leach is a very charming 52 year old perimenopausal white female.  She thinks that she still does have her monthly cycles.  She has been quite healthy.  She does work.  She has been quite busy at work.  Comes in with her husband.  I did see her father-in-law about 10 years ago.  I think he had metastatic melanoma and subsequently passed away.  She does get routine mammograms.  She had a screening mammogram that was done showed some abnormal calcifications.  She had diagnostic mammogram on 06/12/2022.  This showed a 1.4 x 1.3 x 1.1 cm lesion that was at the 2 o'clock position about 14 cm from the nipple.  She subsequently underwent a ultrasound and a core biopsy.  The ultrasound did not show any obvious axillary adenopathy.  The core biopsy was done on 06/19/2022.  The pathology report ([id number]) showed an invasive ductal carcinoma.  It had a nuclear score of 2 and a tubule score of 3.  There was no lymphovascular invasion.  The prognostic markers were ER positive/PR negative/HER2 negative.  She had a very high Ki-67 level of's 85%.  She has seen Dr. Barry Dienes of surgery.  She will see Radiation Oncology next week.  I think surgery schedule for February 7.  She has had a family history of breast cancer.  A maternal great aunt had breast cancer.  I think a cousin on her mother side also had breast cancer.  She has had no change in bowel or bladder habits.  She has had no cough or shortness of breath.  I think she had COVID back in 2021.  She has had no hot flashes or sweats.  She was on a long-acting implantable contraceptive.  This has been removed.  She does not smoke.  She really has very little alcohol use consumption.  Currently, I would have to  say that her performance status is ECOG 0.    Past Medical History:  Diagnosis Date   Chronic headaches    Hyperlipidemia    Hypertension    OSA on CPAP    PE (pulmonary embolism) 2006   Pre-diabetes    Thyroid disease    Tuberculosis    as a child but no problems now  :   Past Surgical History:  Procedure Laterality Date   BREAST BIOPSY Left 06/19/2022   [place] LT BREAST BX W LOC DEV 1ST LESION IMG BX SPEC US GUIDE 06/19/2022 [facility]   COLONOSCOPY     FRACTURE SURGERY Left 2020   foot    HERNIA REPAIR    :   Current Outpatient Medications:    carvedilol (COREG) 3.125 MG tablet, Take 3.125 mg by mouth 2 (two) times daily., Disp: , Rfl:    famotidine (PEPCID) 40 MG tablet, Take 40 mg by mouth daily., Disp: , Rfl:    Levothyroxine Sodium 25 MCG CAPS, Take 25 mcg by mouth daily before breakfast., Disp: , Rfl:    lisinopril-hydrochlorothiazide (ZESTORETIC) 20-25 MG tablet, Take 1 tablet by mouth 2 (two) times daily., Disp: , Rfl:    loratadine (CLARITIN) 10 MG tablet, Take 10 mg by mouth daily., Disp: , Rfl:    Melatonin 10 MG TABS, Take 10 mg by mouth at bedtime.,  Disp: , Rfl:    meloxicam (MOBIC) 15 MG tablet, Take 15 mg by mouth daily as needed., Disp: , Rfl:    montelukast (SINGULAIR) 10 MG tablet, Take 10 mg by mouth daily., Disp: , Rfl:    TRULICITY 9.83 JA/2.5KN SOPN, INJECT 0.'75MG'$  UNDER THE SKIN ONCE WEEKLY, Disp: , Rfl:    Vitamin D, Ergocalciferol, (DRISDOL) 1.25 MG (50000 UNIT) CAPS capsule, Take 50,000 Units by mouth once a week., Disp: , Rfl:    ALPRAZolam (XANAX) 0.25 MG tablet, 0.25 mg 2 (two) times daily as needed. (Patient not taking: Reported on 06/26/2022), Disp: , Rfl:    COLLAGEN PO, Take 1 tablet by mouth daily. (Patient not taking: Reported on 06/26/2022), Disp: , Rfl:    Continuous Blood Gluc Sensor (FREESTYLE LIBRE 3 SENSOR) MISC, CHECK BLOOD SUGAR FASTING AND 2 HOURS AFTER A MEAL (Patient not taking: Reported on 06/26/2022), Disp: , Rfl:     fluticasone (FLONASE) 50 MCG/ACT nasal spray, Place 2 sprays into both nostrils daily as needed for allergies. (Patient not taking: Reported on 06/26/2022), Disp: , Rfl:    oxyCODONE-acetaminophen (PERCOCET/ROXICET) 5-325 MG tablet, Take 1 tablet by mouth every 6 (six) hours as needed for severe pain. (Patient not taking: Reported on 06/26/2022), Disp: 10 tablet, Rfl: 0:  :   Allergies  Allergen Reactions   Augmentin [Amoxicillin-Pot Clavulanate] Diarrhea    Yeast Infection  :   Family History  Problem Relation Age of Onset   Hypertension Mother    Stroke Mother    Hypertension Father    Heart disease Father    Irritable bowel syndrome Sister    Hypertension Brother    Hyperlipidemia Brother    Stomach cancer Maternal Grandfather    Diabetes Paternal Grandmother    Irritable bowel syndrome Maternal Aunt    Breast cancer Other        Maternal Great Aunt   Colon cancer Neg Hx    Colon polyps Neg Hx    Esophageal cancer Neg Hx    Rectal cancer Neg Hx   :   Social History   Socioeconomic History   Marital status: Married    Spouse name: Not on file   Number of children: 1   Years of education: Not on file   Highest education level: Not on file  Occupational History   Occupation: Web designer  Tobacco Use   Smoking status: Never   Smokeless tobacco: Never  Vaping Use   Vaping Use: Never used  Substance and Sexual Activity   Alcohol use: No    Alcohol/week: 0.0 standard drinks of alcohol   Drug use: No   Sexual activity: Yes  Other Topics Concern   Not on file  Social History Narrative   Not on file   Social Determinants of Health   Financial Resource Strain: Not on file  Food Insecurity: Not on file  Transportation Needs: Not on file  Physical Activity: Not on file  Stress: Not on file  Social Connections: Not on file  Intimate Partner Violence: Not on file  :  Review of Systems  Constitutional: Negative.   HENT: Negative.    Eyes:  Negative.   Respiratory: Negative.    Cardiovascular: Negative.   Gastrointestinal: Negative.   Genitourinary: Negative.   Musculoskeletal: Negative.   Skin: Negative.   Neurological: Negative.   Endo/Heme/Allergies: Negative.   Psychiatric/Behavioral: Negative.       Exam: Vital signs are temperature of 98.4.  Pulse 84.  Blood pressure 135/86.  Weight is 228 pounds.  '@IPVITALS'$ @ Physical Exam Vitals reviewed.  Constitutional:      Comments: Her breast exam shows right breast with no masses, edema or erythema.  There is no right axillary adenopathy.  Her left breast shows an incredibly small biopsy scar at the 2 o'clock position.  There is no erythema.  There is no ecchymoses.  There is no swelling.  There is no tenderness.  There is no left axillary adenopathy.  HENT:     Head: Normocephalic and atraumatic.  Eyes:     Pupils: Pupils are equal, round, and reactive to light.  Cardiovascular:     Rate and Rhythm: Normal rate and regular rhythm.     Heart sounds: Normal heart sounds.  Pulmonary:     Effort: Pulmonary effort is normal.     Breath sounds: Normal breath sounds.  Abdominal:     General: Bowel sounds are normal.     Palpations: Abdomen is soft.     Comments: Her abdominal exam is soft.  She has good bowel sounds.  There is no fluid wave.  There is no palpable liver or spleen tip.  Musculoskeletal:        General: No tenderness or deformity. Normal range of motion.     Cervical back: Normal range of motion.  Lymphadenopathy:     Cervical: No cervical adenopathy.  Skin:    General: Skin is warm and dry.     Findings: No erythema or rash.  Neurological:     Mental Status: She is alert and oriented to person, place, and time.  Psychiatric:        Behavior: Behavior normal.        Thought Content: Thought content normal.        Judgment: Judgment normal.     Recent Labs    06/26/22 1101  WBC 6.6  HGB 11.2*  HCT 34.8*  PLT 241    Recent Labs     06/26/22 1101  NA 139  K 4.4  CL 103  CO2 27  GLUCOSE 100*  BUN 20  CREATININE 1.08*  CALCIUM 9.8    Blood smear review: None  Pathology: See above    Assessment and Plan: Holly Leach is a very charming 52 year old perimenopausal white female.  She has but certainly appears to be an early stage invasive ductal carcinoma of the left breast.  She has some slight elevated LFTs..  I think we will going to have to get an ultrasound of her liver to see what might be going on.  She does have diabetes.  She is on Trulicity.  I think what would troubles me is a fact that she has a very high proliferation marker.  This might be indicative of the fact that she may have a high Oncotype score.  We clearly will have to check an Oncotype score on her since she would be a candidate for chemotherapy if necessary.  I had a long talk with she and her husband.  She could have a lumpectomy with Dr. Barry Dienes.  As such, she will clearly need to have adjuvant radiation therapy.  I told her that the sequencing of therapy will be dependent upon the Oncotype score.  If she has a high Oncotype score, then we will do chemotherapy prior to radiation.  If she does not have a high Oncotype score, she will have radiation and then we will start antiestrogen therapy.  Will try to get the ultrasound of the liver  next week.  I would like to have this done before any surgery.  For right now, we will plan to get her back to see [place] in about 2 or 3 weeks after she has had surgery.  I would like to see her heal up before she comes in to see [place].  By then we would have had the Oncotype score back also.

## 2022-06-26 NOTE — Progress Notes (Signed)
Initial RN Navigator Patient Visit  Name: Holly Leach Date of Referral : 06/24/2022 Diagnosis: Malignant neoplasm of upper-outer quadrant of left breast in female, estrogen receptor positive   Met with patient prior to their visit with MD. Hanley Seamen patient "Your Patient Navigator" handout which explains my role, areas in which I am able to help, and all the contact information for myself and the office. Also gave patient MD and Navigator business card. Reviewed with patient the general overview of expected course after initial diagnosis and time frame for all steps to be completed.  New patient packet given to patient which includes: orientation to office and staff; campus directory; education on My Chart and Advance Directives; and patient centered education on breast cancer.   She comes in with her husband.   Patient is scheduled for surgery on 2/7. [facility] with lumpectomy. She has already been referred to Rad[facility]c. She has her consultation with them in 06/30/22.  Patient understands all follow up procedures and expectations. They have my number to reach out for any further clarification or additional needs.   Oncology Nurse Navigator Documentation     06/26/2022   11:00 AM  Oncology Nurse Navigator Flowsheets  Phase of Treatment Surgery  Navigator Follow Up Date: 07/08/2022  Navigator Follow Up Reason: Surgery  Navigator Location [facility]  Navigator Encounter Type Initial MedOnc  Patient Visit Type MedOnc  Treatment Phase Pre-Tx/Tx Discussion  Barriers/Navigation Needs Coordination of Care;Education  Education Newly Diagnosed Cancer Education;Preparing for Upcoming Surgery/ Treatment  Interventions Education;Psycho-Social Support  Acuity Level 2-Minimal Needs (1-2 Barriers Identified)  Education Method Verbal;Written  Support Groups/Services Friends and Family  Time Spent with Patient 30

## 2022-06-29 NOTE — Therapy (Signed)
OUTPATIENT PHYSICAL THERAPY BREAST CANCER BASELINE EVALUATION   Patient Name: Holly Leach MRN: [id number] DOB:1971/05/29, 52 y.o., female Today's Date: 06/30/2022  END OF SESSION:  PT End of Session - 06/30/22 1251     Visit Number 1    Number of Visits 2    Date for PT Re-Evaluation 07/28/22    PT Start Time 1210    PT Stop Time 3329    PT Time Calculation (min) 35 min    Activity Tolerance Patient tolerated treatment well    Behavior During Therapy Holly Leach for tasks assessed/performed             Past Medical History:  Diagnosis Date   Chronic headaches    Hyperlipidemia    Hypertension    OSA on CPAP    PE (pulmonary embolism) 2006   Pre-diabetes    Thyroid disease    Tuberculosis    as a child but no problems now   Past Surgical History:  Procedure Laterality Date   BREAST BIOPSY Left 06/19/2022   [place] LT BREAST BX W LOC DEV 1ST LESION IMG BX SPEC US GUIDE 06/19/2022 [facility]   COLONOSCOPY     FRACTURE SURGERY Left 2020   foot    HERNIA REPAIR     Patient Active Problem List   Diagnosis Date Noted   Malignant neoplasm of upper-outer quadrant of left breast in female, estrogen receptor positive ([place]) 06/24/2022   Neck mass 11/03/2013   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/03/2013    PCP: Holly Pollen, MD  REFERRING PROVIDER: Stark Klein, MD  REFERRING DIAG: C50.412,Z17.0 (ICD-10-CM) - Malignant neoplasm of upper-outer quadrant of left breast in female, estrogen receptor positive ([place])  THERAPY DIAG:  Abnormal posture  Malignant neoplasm of upper-outer quadrant of left breast in female, estrogen receptor positive ([place])  Rationale for Evaluation and Treatment: Rehabilitation  ONSET DATE: 06/19/22  SUBJECTIVE:                                                                                                                                                                                            SUBJECTIVE STATEMENT: Patient reports she is here today to be seen  by her medical team for her newly diagnosed left breast cancer. I have trouble with my R arm and have been having pain since last September and I can not sleep on that side. I also have an ACL tear on the R that needs surgery.   PERTINENT HISTORY:  Patient was diagnosed on 06/19/22 with left grade 2. It measures 1.4 x1.3 x 1.1 cm and is located in the upper outer quadrant. It is ER+,  PR-, HER2-  with a Ki67 of 85%. Plan is to undergo a L breast lumpectomy and SLNB on 07/08/22.  PATIENT GOALS:   reduce lymphedema risk and learn post op HEP.   PAIN:  Are you having pain? No  PRECAUTIONS: Active CA Other: ACL tear on R  HAND DOMINANCE: right  WEIGHT BEARING RESTRICTIONS: No  FALLS:  Has patient fallen in last 6 months? 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 - tries to walk occasionally  PRIOR LEVEL OF FUNCTION: Independent   OBJECTIVE:  COGNITION: Overall cognitive status: Within functional limits for tasks assessed    POSTURE:  Forward head and rounded shoulders posture  UPPER EXTREMITY AROM/PROM:  A/PROM RIGHT   eval   Shoulder extension 65  Shoulder flexion 174  Shoulder abduction 174  Shoulder internal rotation 47  Shoulder external rotation 80    (Blank rows = not tested)  A/PROM LEFT   eval  Shoulder extension 50  Shoulder flexion 169  Shoulder abduction 175  Shoulder internal rotation 69  Shoulder external rotation 89    (Blank rows = not tested)  CERVICAL AROM: All within normal limits:    Percent limited  Flexion WF:  Extension WFL  Right lateral flexion WFL  Left lateral flexion WFL  Right rotation WFL  Left rotation WFL    UPPER EXTREMITY STRENGTH: 5/5  LYMPHEDEMA ASSESSMENTS:   LANDMARK RIGHT   eval  10 cm proximal to olecranon process 40  Olecranon process 30.1  10 cm proximal to ulnar  styloid process 25.5  Just proximal to ulnar styloid process 17.5  Across hand at thumb web space 19  At base of 2nd digit 6.4  (Blank rows = not tested)  LANDMARK LEFT   eval  10 cm proximal to olecranon process 38.5  Olecranon process 30  10 cm proximal to ulnar styloid process 24.9  Just proximal to ulnar styloid process 16  Across hand at thumb web space 19.5  At base of 2nd digit 6.4  (Blank rows = not tested)  L-DEX LYMPHEDEMA SCREENING:  The patient was assessed using the L-Dex machine today to produce a lymphedema index baseline score. The patient will be reassessed on a regular basis (typically every 3 months) to obtain new L-Dex scores. If the score is > 6.5 points away from his/her baseline score indicating onset of subclinical lymphedema, it will be recommended to wear a compression garment for 4 weeks, 12 hours per day and then be reassessed. If the score continues to be > 6.5 points from baseline at reassessment, we will initiate lymphedema treatment. Assessing in this manner has a 95% rate of preventing clinically significant lymphedema.   L-DEX FLOWSHEETS - 06/30/22 1200       L-DEX LYMPHEDEMA SCREENING   Measurement Type Unilateral    L-DEX MEASUREMENT EXTREMITY Upper Extremity    POSITION  Standing    DOMINANT SIDE Right    At Risk Side Left    BASELINE SCORE (UNILATERAL) -0.2             QUICK DASH SURVEY:  Holly Leach - 06/30/22 0001     Open a tight or new jar No difficulty    Do heavy household chores (wash walls, wash floors) No difficulty    Carry a shopping bag or briefcase No difficulty    Wash your back No difficulty  Use a knife to cut food No difficulty    Recreational activities in which you take some force or impact through your arm, shoulder, or hand (golf, hammering, tennis) No difficulty    During the past week, to what extent has your arm, shoulder or hand problem interfered with your normal social activities with family, friends,  neighbors, or groups? Not at all    During the past week, to what extent has your arm, shoulder or hand problem limited your work or other regular daily activities Not at all    Arm, shoulder, or hand pain. None    Tingling (pins and needles) in your arm, shoulder, or hand None    Difficulty Sleeping No difficulty    DASH Score 0 %              PATIENT EDUCATION:  Education details: Lymphedema risk reduction and post op shoulder/posture HEP Person educated: Patient Education method: Explanation, Demonstration, Handout Education comprehension: Patient verbalized understanding and returned demonstration  HOME EXERCISE PROGRAM: Patient was instructed today in a home exercise program today for post op shoulder range of motion. These included active assist shoulder flexion in sitting, scapular retraction, wall walking with shoulder abduction, and hands behind head external rotation.  She was encouraged to do these twice a day, holding 3 seconds and repeating 5 times when permitted by her physician.   ASSESSMENT:  CLINICAL IMPRESSION: Pt reports to PT with newly diagnosed L breast cancer. She will be having a L lumpectomy and SLNB on 07/08/22. She has a history of R shoulder pain since Sept 2023 but has not followed up with her ortho doctor yet. She is also in need of surgical repair of a R torn ACL. Her shoulder ROM is WFL at baseline. She will benefit from a post op PT reassessment to determine needs and from L-Dex screens every 3 months for 2 years to detect subclinical lymphedema.  Pt will benefit from skilled therapeutic intervention to improve on the following deficits: Decreased knowledge of precautions, impaired UE functional use, pain, decreased ROM, postural dysfunction.   PT treatment/interventions: ADL/self-care home management, pt/family education, therapeutic exercise  REHAB POTENTIAL: Good  CLINICAL DECISION MAKING: Stable/uncomplicated  EVALUATION COMPLEXITY:  Low   GOALS: Goals reviewed with patient? YES  LONG TERM GOALS: (STG=LTG)    Name Target Date Goal status  1 Pt will be able to verbalize understanding of pertinent lymphedema risk reduction practices relevant to her dx specifically related to skin care.  Baseline:  No knowledge 06/30/2022 Achieved at eval  2 Pt will be able to return demo and/or verbalize understanding of the post op HEP related to regaining shoulder ROM. Baseline:  No knowledge 06/30/2022 Achieved at eval  3 Pt will be able to verbalize understanding of the importance of attending the post op After Breast CA Class for further lymphedema risk reduction education and therapeutic exercise.  Baseline:  No knowledge 06/30/2022 Achieved at eval  4 Pt will demo she has regained full shoulder ROM and function post operatively compared to baselines.  Baseline: See objective measurements taken today. 07/28/22 NEW    PLAN:  PT FREQUENCY/DURATION: EVAL and 1 follow up appointment.   PLAN FOR NEXT SESSION: will reassess 3-4 weeks post op to determine needs.   Patient will follow up at outpatient cancer rehab 3-4 weeks following surgery.  If the patient requires physical therapy at that time, a specific plan will be dictated and sent to the referring physician for approval. The patient  was educated today on appropriate basic range of motion exercises to begin post operatively and the importance of attending the After Breast Cancer class following surgery.  Patient was educated today on lymphedema risk reduction practices as it pertains to recommendations that will benefit the patient immediately following surgery.  She verbalized good understanding.    Physical Therapy Information for After Breast Cancer Surgery/Treatment:  Lymphedema is a swelling condition that you may be at risk for in your arm if you have lymph nodes removed from the armpit area.  After a sentinel node biopsy, the risk is approximately 5-9% and is higher after an  axillary node dissection.  There is treatment available for this condition and it is not life-threatening.  Contact your physician or physical therapist with concerns. You may begin the 4 shoulder/posture exercises (see additional sheet) when permitted by your physician (typically a week after surgery).  If you have drains, you may need to wait until those are removed before beginning range of motion exercises.  A general recommendation is to not lift your arms above shoulder height until drains are removed.  These exercises should be done to your tolerance and gently.  This is not a "no pain/no gain" type of recovery so listen to your body and stretch into the range of motion that you can tolerate, stopping if you have pain.  If you are having immediate reconstruction, ask your plastic surgeon about doing exercises as he or she may want you to wait. We encourage you to attend the free one time ABC (After Breast Cancer) class offered by [facility].  You will learn information related to lymphedema risk, prevention and treatment and additional exercises to regain mobility following surgery.  You can call [phone] for more information.  This is offered the 1st and 3rd Monday of each month.  You only attend the class one time. While undergoing any medical procedure or treatment, try to avoid blood pressure being taken or needle sticks from occurring on the arm on the side of cancer.   This recommendation begins after surgery and continues for the rest of your life.  This may help reduce your risk of getting lymphedema (swelling in your arm). An excellent resource for those seeking information on lymphedema is the National Lymphedema Network's web site. It can be accessed at [place].org If you notice swelling in your hand, arm or breast at any time following surgery (even if it is many years from now), please contact your doctor or physical therapist to discuss this.  Lymphedema  can be treated at any time but it is easier for you if it is treated early on.  If you feel like your shoulder motion is not returning to normal in a reasonable amount of time, please contact your surgeon or physical therapist.  [facility] [phone]. [address], [place] [address]  ABC CLASS After Breast Cancer Class  After Breast Cancer Class is a specially designed exercise class to assist you in a safe recover after having breast cancer surgery.  In this class you will learn how to get back to full function whether your drains were just removed or if you had surgery a month ago.  This one-time class is held the 1st and 3rd Monday of every month from 11:00 a.m. until 12:00 noon virtually.  This class is FREE and space is limited. For more information or to register for the next available class, call [phone].  Class Goals  Understand specific stretches to improve the flexibility of you chest and shoulder. Learn ways to safely strengthen your upper body and improve your posture. Understand the warning signs of infection and why you may be at risk for an arm infection. Learn about Lymphedema and prevention.  ** You do not attend this class until after surgery.  Drains must be removed to participate  Patient was instructed today in a home exercise program today for post op shoulder range of motion. These included active assist shoulder flexion in sitting, scapular retraction, wall walking with shoulder abduction, and hands behind head external rotation.  She was encouraged to do these twice a day, holding 3 seconds and repeating 5 times when permitted by her physician.    [facility] [place], PT 06/30/2022, 12:53 PM

## 2022-06-29 NOTE — Progress Notes (Signed)
Radiation Oncology         [phone] ________________________________  Initial Outpatient Consultation  Name: Holly Leach MRN: [id number]  Date: 06/30/2022  DOB: 01-28-1971  ZD:[id number], Maebelle Munroe, MD  Stark Klein, MD   REFERRING PHYSICIAN: Stark Klein, MD  DIAGNOSIS:    ICD-10-CM   1. Malignant neoplasm of upper-outer quadrant of left breast in female, estrogen receptor positive ([place])  C50.412 Ambulatory Referral to [facility] Nutrition   Z17.0       Stage clinical [id number] Left Breast UOQ, Invasive Ductal Carcinoma, ER+ / PR- / Her2-, Grade 2  CHIEF COMPLAINT: Here to discuss management of left breast cancer  HISTORY OF PRESENT ILLNESS::Holly Leach is a 52 y.o. female who presented with a left breast abnormality on the following imaging: bilateral screening mammogram on the date of 05/18/22.  No symptoms, if any, were reported at that time.   Diagnostic left breast mammogram and left breast ultrasound on 06/12/22 showed an irregular, hypoechoic mass in the 2 o'clock left breast measuring 1.4 cm. No left axillary lymphadenopathy was appreciated.   Biopsy of the 2 o'clock left breast on date of 06/19/22 showed grade 2 invasive ductal carcinoma measuring 0.6 cm in the greatest linear extent of the sample.  ER status: 40% positive with weak staining intensity; PR status 0% negative; Proliferation marker Ki67 at 85%; Her2 status negative; Grade 2. No lymph nodes were examined.   Accordingly, the patient was referred to Dr. Barry Dienes on 06/23/22 to discuss surgical treatment options. Following discission of the risks and benefits, the patient has opted to proceed with breast conserving surgery and SLN biopsies. Her surgery has been scheduled for 07/08/22.   The patient was then referred to Dr. Marin Olp on 06/26/22 to discuss the role of systemic treatment. Based on her high proliferation index, Dr. Marin Olp has ordered Oncotype testing to assess the need for chemotherapy. The patient will  return to Dr. Marin Olp after surgery to discuss Oncotype results.   Of note: labs collected at the time of her initial consultation with Dr. Marin Olp were notable for slightly elevated LFT's. Dr. Marin Olp has ordered a liver/abdominal US to further evaluate this.   Today she is present with her supportive husband. She denies breast pain, swelling, nipple discharge or bleeding, or any other breast specific complaints. She endorses previous weight gain and has recently been placed on Trulicity for diabetes management. She is curious about diet management during treatment.  PREVIOUS RADIATION THERAPY: No  PAST MEDICAL HISTORY:  has a past medical history of Chronic headaches, Hyperlipidemia, Hypertension, OSA on CPAP, PE (pulmonary embolism) (2006), Pre-diabetes, Thyroid disease, and Tuberculosis.    PAST SURGICAL HISTORY: Past Surgical History:  Procedure Laterality Date   BREAST BIOPSY Left 06/19/2022   [place] LT BREAST BX W LOC DEV 1ST LESION IMG BX SPEC US GUIDE 06/19/2022 [facility] MAMMOGRAP[facility]   COLONOSCOPY     FRACTURE SURGERY Left 2020   foot    HERNIA REPAIR      FAMILY HISTORY: family history includes Breast cancer in an other family member; Diabetes in her paternal grandmother; Heart disease in her father; Hyperlipidemia in her brother; Hypertension in her brother, father, and mother; Irritable bowel syndrome in her maternal aunt and sister; Stomach cancer in her maternal grandfather; Stroke in her mother.  SOCIAL HISTORY:  reports that she has never smoked. She has never used smokeless tobacco. She reports that she does not drink alcohol and does not use drugs.  ALLERGIES: Augmentin [amoxicillin-pot clavulanate]  MEDICATIONS:  Current Outpatient Medications  Medication Sig Dispense Refill   ALPRAZolam (XANAX) 0.25 MG tablet 0.25 mg 2 (two) times daily as needed.     carvedilol (COREG) 3.125 MG tablet Take 3.125 mg by mouth 2 (two) times daily.     Continuous Blood Gluc Sensor (FREESTYLE  LIBRE 3 SENSOR) MISC      famotidine (PEPCID) 40 MG tablet Take 40 mg by mouth daily.     fluticasone (FLONASE) 50 MCG/ACT nasal spray Place 2 sprays into both nostrils daily as needed for allergies.     Levothyroxine Sodium 25 MCG CAPS Take 25 mcg by mouth daily before breakfast.     lisinopril-hydrochlorothiazide (ZESTORETIC) 20-25 MG tablet Take 1 tablet by mouth 2 (two) times daily.     loratadine (CLARITIN) 10 MG tablet Take 10 mg by mouth daily.     Melatonin 10 MG TABS Take 10 mg by mouth at bedtime.     meloxicam (MOBIC) 15 MG tablet Take 15 mg by mouth daily as needed.     montelukast (SINGULAIR) 10 MG tablet Take 10 mg by mouth daily.     TRULICITY 7.00 FV/4.9SW SOPN INJECT 0.'75MG'$  UNDER THE SKIN ONCE WEEKLY     Vitamin D, Ergocalciferol, (DRISDOL) 1.25 MG (50000 UNIT) CAPS capsule Take 50,000 Units by mouth once a week.     COLLAGEN PO Take 1 tablet by mouth daily. (Patient not taking: Reported on 06/26/2022)     oxyCODONE-acetaminophen (PERCOCET/ROXICET) 5-325 MG tablet Take 1 tablet by mouth every 6 (six) hours as needed for severe pain. (Patient not taking: Reported on 06/26/2022) 10 tablet 0   No current facility-administered medications for this encounter.    REVIEW OF SYSTEMS: As above in HPI.   PHYSICAL EXAM:  height is '5\' 3"'$  (1.6 m) and weight is 228 lb (103.4 kg). Her temporal temperature is 96.7 F (35.9 C) (abnormal). Her blood pressure is 107/85 and her pulse is 95. Her respiration is 18 and oxygen saturation is 97%.   General: Alert and oriented, in no acute distress HEENT: Head is normocephalic. Extraocular movements are intact. Oropharynx is clear. Neck: Neck is supple, no palpable cervical or supraclavicular lymphadenopathy. Heart: Regular in rate and rhythm with no murmurs, rubs, or gallops. Chest: Clear to auscultation bilaterally, with no rhonchi, wheezes, or rales. Abdomen: Soft, nontender, nondistended, with no rigidity or guarding. Extremities: No cyanosis or  edema. Lymphatics: see Neck Exam Skin: No concerning lesions. Musculoskeletal: symmetric strength and muscle tone throughout. Neurologic: Cranial nerves II through XII are grossly intact. No obvious focalities. Speech is fluent. Coordination is intact. Psychiatric: Judgment and insight are intact. Affect is appropriate. Breasts: Tenderness to palpation in the upper outer quadrant, consistent with biopsy site. No palpable masses appreciated in the breasts or axillae. No nipple discharge or bleeding.     ECOG = 0  0 - Asymptomatic (Fully active, able to carry on all predisease activities without restriction)  1 - Symptomatic but completely ambulatory (Restricted in physically strenuous activity but ambulatory and able to carry out work of a light or sedentary nature. For example, light housework, office work)  2 - Symptomatic, <50% in bed during the day (Ambulatory and capable of all self care but unable to carry out any work activities. Up and about more than 50% of waking hours)  3 - Symptomatic, >50% in bed, but not bedbound (Capable of only limited self-care, confined to bed or chair 50% or more of waking hours)  4 - Bedbound (Completely  disabled. Cannot carry on any self-care. Totally confined to bed or chair)  5 - Death   Eustace Pen MM, Creech RH, Tormey DC, et al. [phone]). "Toxicity and response criteria of the [facility]". [place] Oncol. 5 (6): 649-55   LABORATORY DATA:  Lab Results  Component Value Date   WBC 6.6 06/26/2022   HGB 11.2 (L) 06/26/2022   HCT 34.8 (L) 06/26/2022   MCV 85.5 06/26/2022   PLT 241 06/26/2022   CMP     Component Value Date/Time   NA 139 06/26/2022 1101   K 4.4 06/26/2022 1101   CL 103 06/26/2022 1101   CO2 27 06/26/2022 1101   GLUCOSE 100 (H) 06/26/2022 1101   BUN 20 06/26/2022 1101   CREATININE 1.08 (H) 06/26/2022 1101   CALCIUM 9.8 06/26/2022 1101   PROT 7.5 06/26/2022 1101   ALBUMIN 4.3 06/26/2022 1101   AST 63  (H) 06/26/2022 1101   ALT 87 (H) 06/26/2022 1101   ALKPHOS 106 06/26/2022 1101   BILITOT 0.4 06/26/2022 1101   GFRNONAA >60 06/26/2022 1101   GFRAA >60 08/30/2016 2135         RADIOGRAPHY: [place] LT BREAST BX W LOC DEV 1ST LESION IMG BX SPEC US GUIDE  Addendum Date: 06/25/2022   ADDENDUM REPORT: 06/25/2022 14:05 ADDENDUM: Pathology revealed GRADE 2 INVASIVE DUCTAL CARCINOMA, of the LEFT breast, 2 o'clock, (coil clip). This was found to be concordant by Dr. Audie Pinto. Pathology results were discussed with the patient and spouse by telephone. The patient reported doing well after the biopsy with tenderness at the site. Post biopsy instructions and care were reviewed and questions were answered. The patient was encouraged to call [facility] for any additional concerns. Surgical consultation has been arranged with Dr. Stark Klein at [facility] on June 23, 2022. Pathology results reported by Stacie Acres RN on 06/23/2022. Electronically Signed   By: Audie Pinto M.D.   On: 06/25/2022 14:05   Result Date: 06/25/2022 CLINICAL DATA:  52 year old female presenting for biopsy of a mass in the left breast at 2 o'clock EXAM: [place] AXILLARY NODE CORE BIOPSY LEFT COMPARISON:  Previous exam(s). PROCEDURE: I met with the patient and we discussed the procedure of ultrasound-guided biopsy, including benefits and alternatives. We discussed the high likelihood of a successful procedure. We discussed the risks of the procedure, including infection, bleeding, tissue injury, clip migration, and inadequate sampling. Informed written consent was given. The usual time-out protocol was performed immediately prior to the procedure. Using sterile technique and 1% Lidocaine as local anesthetic, under direct ultrasound visualization, a 14 gauge spring-loaded device was used to perform biopsy of a mass in the left breast at 2 o'clock using a lateral approach. At the conclusion of the  procedure a coil tissue marker clip was deployed into the biopsy cavity. Follow up 2 view mammogram was performed and dictated separately. IMPRESSION: Ultrasound guided biopsy of a mass in the left breast at 2 o'clock. No apparent complications. Electronically Signed: By: Audie Pinto M.D. On: 06/19/2022 12:45  MM CLIP PLACEMENT LEFT  Result Date: 06/19/2022 CLINICAL DATA:  Post procedure mammogram for clip placement EXAM: 3D DIAGNOSTIC LEFT MAMMOGRAM POST ULTRASOUND BIOPSY COMPARISON:  Previous exam(s). FINDINGS: 3D Mammographic images were obtained following ultrasound guided biopsy of a mass in the left breast at 2 o'clock. The biopsy marking clip is in expected position at the site of biopsy. IMPRESSION: Appropriate positioning of the coil shaped biopsy  marking clip at the site of biopsy in the left breast at 2 o'clock. Final Assessment: Post Procedure Mammograms for Marker Placement Electronically Signed   By: Audie Pinto M.D.   On: 06/19/2022 12:44  MM DIAG BREAST TOMO UNI LEFT  Result Date: 06/12/2022 CLINICAL DATA:  52 year old female recalled from screening mammogram dated 05/18/2022 for a possible left breast mass. EXAM: DIGITAL DIAGNOSTIC UNILATERAL LEFT MAMMOGRAM WITH TOMOSYNTHESIS; ULTRASOUND LEFT BREAST LIMITED TECHNIQUE: Left digital diagnostic mammography and breast tomosynthesis was performed.; Targeted ultrasound examination of the left breast was performed. COMPARISON:  Previous exam(s). ACR Breast Density Category b: There are scattered areas of fibroglandular density. FINDINGS: There is a persistent round, spiculated hyperdense mass in the upper outer left breast at posterior depth. Further evaluation with ultrasound was performed. Targeted ultrasound is performed, showing an irregular, hypoechoic mass at the 2 o'clock position 14 cm from the nipple. It measures 1.4 x 1.3 x 1.1 cm. There is no definite vascularity. Evaluation of the left axilla demonstrates no suspicious  lymphadenopathy. IMPRESSION: 1. Suspicious left breast mass corresponding with the screening mammographic findings. Recommend ultrasound-guided biopsy. 2. No suspicious left axillary lymphadenopathy. RECOMMENDATION: Ultrasound-guided biopsy of the left breast. I have discussed the findings and recommendations with the patient. If applicable, a reminder letter will be sent to the patient regarding the next appointment. BI-RADS CATEGORY  5: Highly suggestive of malignancy. Electronically Signed   By: Kristopher Oppenheim M.D.   On: 06/12/2022 16:32  US BREAST LTD UNI LEFT INC AXILLA  Result Date: 06/12/2022 CLINICAL DATA:  52 year old female recalled from screening mammogram dated 05/18/2022 for a possible left breast mass. EXAM: DIGITAL DIAGNOSTIC UNILATERAL LEFT MAMMOGRAM WITH TOMOSYNTHESIS; ULTRASOUND LEFT BREAST LIMITED TECHNIQUE: Left digital diagnostic mammography and breast tomosynthesis was performed.; Targeted ultrasound examination of the left breast was performed. COMPARISON:  Previous exam(s). ACR Breast Density Category b: There are scattered areas of fibroglandular density. FINDINGS: There is a persistent round, spiculated hyperdense mass in the upper outer left breast at posterior depth. Further evaluation with ultrasound was performed. Targeted ultrasound is performed, showing an irregular, hypoechoic mass at the 2 o'clock position 14 cm from the nipple. It measures 1.4 x 1.3 x 1.1 cm. There is no definite vascularity. Evaluation of the left axilla demonstrates no suspicious lymphadenopathy. IMPRESSION: 1. Suspicious left breast mass corresponding with the screening mammographic findings. Recommend ultrasound-guided biopsy. 2. No suspicious left axillary lymphadenopathy. RECOMMENDATION: Ultrasound-guided biopsy of the left breast. I have discussed the findings and recommendations with the patient. If applicable, a reminder letter will be sent to the patient regarding the next appointment. BI-RADS  CATEGORY  5: Highly suggestive of malignancy. Electronically Signed   By: Kristopher Oppenheim M.D.   On: 06/12/2022 16:32     IMPRESSION/PLAN: Left Breast UOQ, Invasive Ductal Carcinoma, ER+ / PR- / Her2-, Grade 2   It was a pleasure meeting the patient today. We discussed the risks, benefits, and side effects of radiotherapy. I recommend radiotherapy to the left breast to reduce her risk of locoregional recurrence by 2/3.  We discussed that radiation would take approximately 4-6 weeks to complete and that I would give the patient a few weeks to heal following surgery before starting treatment planning. If chemotherapy were to be given, this would precede radiotherapy. We spoke about acute effects including skin irritation and fatigue as well as much less common late effects including internal organ injury or irritation. We spoke about the latest technology that is used to minimize the risk  of late effects for patients undergoing radiotherapy to the breast or chest wall. No guarantees of treatment were given. The patient is enthusiastic about proceeding with treatment. I look forward to participating in the patient's care.  I will await her referral back to me for postoperative follow-up and eventual CT simulation/treatment planning.  Patient inquired about her diet throughout her breast cancer treatment. She has a suppressed appetite because of the Trulicity she has recently started taking. We discussed the importance of staying well nourished throughout her treatment. We also discussed the benefits and specifics of the Mediterranean diet. Referral made to nutrition for further assistance.   Referral to financial counseling to address questions re: billing.  On date of service, in total, I spent 60 minutes on this encounter. Patient was seen in person.   __________________________________________   Leona Singleton, PA    Eppie Gibson, MD  This document serves as a record of services personally performed  by Eppie Gibson, MD. It was created on her behalf by Roney Mans, a trained medical scribe. The creation of this record is based on the scribe's personal observations and the provider's statements to them. This document has been checked and approved by the attending provider.

## 2022-06-30 ENCOUNTER — Encounter: Payer: Self-pay | Admitting: Radiation Oncology

## 2022-06-30 ENCOUNTER — Encounter: Payer: Self-pay | Admitting: Physical Therapy

## 2022-06-30 ENCOUNTER — Other Ambulatory Visit: Payer: Self-pay

## 2022-06-30 ENCOUNTER — Ambulatory Visit
Admission: RE | Admit: 2022-06-30 | Discharge: 2022-06-30 | Disposition: A | Discharge status: Home / Self Care | Payer: BC Managed Care – PPO | Source: Ambulatory Visit | Attending: Radiation Oncology | Admitting: Radiation Oncology

## 2022-06-30 ENCOUNTER — Ambulatory Visit: Payer: BC Managed Care – PPO | Attending: General Surgery | Admitting: Physical Therapy

## 2022-06-30 VITALS — BP 107/85 | HR 95 | Temp 96.7°F | Resp 18 | Ht 63.0 in | Wt 228.0 lb

## 2022-06-30 DIAGNOSIS — E079 Disorder of thyroid, unspecified: Secondary | ICD-10-CM | POA: Diagnosis not present

## 2022-06-30 DIAGNOSIS — C50412 Malignant neoplasm of upper-outer quadrant of left female breast: Secondary | ICD-10-CM | POA: Diagnosis present

## 2022-06-30 DIAGNOSIS — Z170 Estrogen receptor positive status [ER+]: Secondary | ICD-10-CM | POA: Diagnosis present

## 2022-06-30 DIAGNOSIS — E785 Hyperlipidemia, unspecified: Secondary | ICD-10-CM | POA: Insufficient documentation

## 2022-06-30 DIAGNOSIS — R7989 Other specified abnormal findings of blood chemistry: Secondary | ICD-10-CM | POA: Diagnosis not present

## 2022-06-30 DIAGNOSIS — R293 Abnormal posture: Secondary | ICD-10-CM | POA: Insufficient documentation

## 2022-06-30 DIAGNOSIS — G4733 Obstructive sleep apnea (adult) (pediatric): Secondary | ICD-10-CM | POA: Insufficient documentation

## 2022-06-30 DIAGNOSIS — I10 Essential (primary) hypertension: Secondary | ICD-10-CM | POA: Insufficient documentation

## 2022-06-30 DIAGNOSIS — Z79899 Other long term (current) drug therapy: Secondary | ICD-10-CM | POA: Insufficient documentation

## 2022-06-30 DIAGNOSIS — Z86711 Personal history of pulmonary embolism: Secondary | ICD-10-CM | POA: Insufficient documentation

## 2022-06-30 LAB — FOLLICLE STIMULATING HORMONE: FSH: 30.5 m[IU]/mL

## 2022-06-30 LAB — LUTEINIZING HORMONE: LH: 55.9 m[IU]/mL

## 2022-06-30 NOTE — Progress Notes (Signed)
Surgical Instructions    Your procedure is scheduled on Wednesday February 7th.  Report to [facility] Main Entrance "A" at 9 A.M., then check in with the Admitting office.  Call this number if you have problems the morning of surgery:  [phone]   If you have any questions prior to your surgery date call [phone]: Open Monday-Friday 8am-4pm If you experience any cold or flu symptoms such as cough, fever, chills, shortness of breath, etc. between now and your scheduled surgery, please notify us at the above number     Remember:  Do not eat after midnight the night before your surgery  You may drink clear liquids until 8am the morning of your surgery.   Clear liquids allowed are: Water, Non-Citrus Juices (without pulp), Carbonated Beverages, Clear Tea, Black Coffee ONLY (NO MILK, CREAM OR POWDERED CREAMER of any kind), and Gatorade   Enhanced Recovery after Surgery for Orthopedics Enhanced Recovery after Surgery is a protocol used to improve the stress on your body and your recovery after surgery.  Patient Instructions  The day of surgery (if you do NOT have diabetes):  Drink ONE (1) Pre-Surgery Clear Ensure by __8___ am the morning of surgery   This drink was given to you during your hospital  pre-op appointment visit. Nothing else to drink after completing the  Pre-Surgery Clear Ensure.          If you have questions, please contact your surgeon's office.     Take these medicines the morning of surgery with A SIP OF WATER: carvedilol (COREG) 3.125 MG tablet  famotidine (PEPCID) 40 MG tablet  Levothyroxine Sodium 25 MCG CAPS  loratadine (CLARITIN) 10 MG tablet  montelukast (SINGULAIR) 10 MG tablet   IF NEEDED  fluticasone (FLONASE) 50 MCG/ACT nasal spray    STOP your Trulicity 7 days prior to surgery, last dose 07/01/22  As of today, STOP taking any Aspirin (unless otherwise instructed by your surgeon) MOBIC, Aleve, Naproxen, Ibuprofen, Motrin, Advil, Goody's,  BC's, all herbal medications, fish oil, and all vitamins.           Do not wear jewelry or makeup. Do not wear lotions, powders, perfumes or deodorant. Do not shave 48 hours prior to surgery.   Do not bring valuables to the hospital. Do not wear nail polish, gel polish, artificial nails, or any other type of covering on natural nails (fingers and toes) If you have artificial nails or gel coating that need to be removed by a nail salon, please have this removed prior to surgery. Artificial nails or gel coating may interfere with anesthesia's ability to adequately monitor your vital signs.  [facility] is not responsible for any belongings or valuables.    Do NOT Smoke (Tobacco/Vaping)  24 hours prior to your procedure  If you use a CPAP at night, you may bring your mask for your overnight stay.   Contacts, glasses, hearing aids, dentures or partials may not be worn into surgery, please bring cases for these belongings   For patients admitted to the hospital, discharge time will be determined by your treatment team.   Patients discharged the day of surgery will not be allowed to drive home, and someone needs to stay with them for 24 hours.   SURGICAL WAITING ROOM VISITATION Patients having surgery or a procedure may have no more than 2 support people in the waiting area - these visitors may rotate.   Children under the age of 36 must have an adult with  them who is not the patient. If the patient needs to stay at the hospital during part of their recovery, the visitor guidelines for inpatient rooms apply. Pre-op nurse will coordinate an appropriate time for 1 support person to accompany patient in pre-op.  This support person may not rotate.   Please refer to [email or web address].hu for the visitor guidelines for Inpatients (after your surgery is over and you are in a regular room).    Special instructions:    Oral Hygiene is also important  to reduce your risk of infection.  Remember - BRUSH YOUR TEETH THE MORNING OF SURGERY WITH YOUR REGULAR TOOTHPASTE   [facility]- Preparing For Surgery  Before surgery, you can play an important role. Because skin is not sterile, your skin needs to be as free of germs as possible. You can reduce the number of germs on your skin by washing with CHG (chlorahexidine gluconate) Soap before surgery.  CHG is an antiseptic cleaner which kills germs and bonds with the skin to continue killing germs even after washing.     Please do not use if you have an allergy to CHG or antibacterial soaps. If your skin becomes reddened/irritated stop using the CHG.  Do not shave (including legs and underarms) for at least 48 hours prior to first CHG shower. It is OK to shave your face.  Please follow these instructions carefully.     Shower the NIGHT BEFORE SURGERY and the MORNING OF SURGERY with CHG Soap.   If you chose to wash your hair, wash your hair first as usual with your normal shampoo. After you shampoo, rinse your hair and body thoroughly to remove the shampoo.  Then [employer] and genitals (private parts) with your normal soap and rinse thoroughly to remove soap.  After that Use CHG Soap as you would any other liquid soap. You can apply CHG directly to the skin and wash gently with a scrungie or a clean washcloth.   Apply the CHG Soap to your body ONLY FROM THE NECK DOWN.  Do not use on open wounds or open sores. Avoid contact with your eyes, ears, mouth and genitals (private parts). Wash Face and genitals (private parts)  with your normal soap.   Wash thoroughly, paying special attention to the area where your surgery will be performed.  Thoroughly rinse your body with warm water from the neck down.  DO NOT shower/wash with your normal soap after using and rinsing off the CHG Soap.  Pat yourself dry with a CLEAN TOWEL.  Wear CLEAN PAJAMAS to bed the night before surgery  Place CLEAN SHEETS on  your bed the night before your surgery  DO NOT SLEEP WITH PETS.   Day of Surgery:  Take a shower with CHG soap. Wear Clean/Comfortable clothing the morning of surgery Do not apply any deodorants/lotions.   Remember to brush your teeth WITH YOUR REGULAR TOOTHPASTE.    If you received a COVID test during your pre-op visit, it is requested that you wear a mask when out in public, stay away from anyone that may not be feeling well, and notify your surgeon if you develop symptoms. If you have been in contact with anyone that has tested positive in the last 10 days, please notify your surgeon.    Please read over the following fact sheets that you were given.

## 2022-07-01 ENCOUNTER — Encounter (HOSPITAL_COMMUNITY): Payer: Self-pay | Admitting: Urology

## 2022-07-01 ENCOUNTER — Encounter (HOSPITAL_COMMUNITY)
Admission: RE | Admit: 2022-07-01 | Discharge: 2022-07-01 | Disposition: A | Discharge status: Home / Self Care | Payer: BC Managed Care – PPO | Source: Ambulatory Visit | Attending: General Surgery | Admitting: General Surgery

## 2022-07-01 ENCOUNTER — Other Ambulatory Visit: Payer: Self-pay

## 2022-07-01 VITALS — BP 104/79 | HR 93 | Temp 98.3°F | Resp 18 | Ht 63.0 in | Wt 225.9 lb

## 2022-07-01 DIAGNOSIS — E119 Type 2 diabetes mellitus without complications: Secondary | ICD-10-CM | POA: Insufficient documentation

## 2022-07-01 DIAGNOSIS — Z01818 Encounter for other preprocedural examination: Secondary | ICD-10-CM | POA: Diagnosis present

## 2022-07-01 HISTORY — DX: Hypothyroidism, unspecified: E03.9

## 2022-07-01 HISTORY — DX: Sprain of anterior cruciate ligament of unspecified knee, initial encounter: S83.519A

## 2022-07-01 HISTORY — DX: Gastro-esophageal reflux disease without esophagitis: K21.9

## 2022-07-01 HISTORY — DX: Type 2 diabetes mellitus without complications: E11.9

## 2022-07-01 LAB — GLUCOSE, CAPILLARY: Glucose-Capillary: 106 mg/dL — ABNORMAL HIGH (ref 70–99)

## 2022-07-01 LAB — ESTRADIOL, ULTRA SENS: Estradiol, Sensitive: 17.3 pg/mL

## 2022-07-01 NOTE — Progress Notes (Signed)
PCP - Horald Pollen Cardiologist - denies  PPM/ICD - denies   EKG - 07/01/2022  Stress Test - denies ECHO - denies Cardiac Cath - denies  Sleep Study - pt reports she has sleep apnea and had a sleep study in 2021. Pt states she uses her CPAP regularly unless she is sick. Pt does not know pressure settings.    Fasting Blood Sugar - Pt states she does not have a meter to check her blood sugar at home. Pt states she has 1 free freestyle libre that lasts 14 days. She does not want to put on prior to surgery and waste if it needs to be taken off for surgery. Pt states she will contact PCP regarding glucose meter and how often she needs to be checking her blood sugars at home. CBG 106 at PAT. Pt reports that her Hgb A1c is 6.8 on 05/20/23, pt able to pull up result on mychart, but could not send to RN. Records requested from PCP. Pt states she can try to bring copy on DOS. Pt educated on s/s of low blood sugar.    Last dose of GLP1 agonist-  No Trulicity after 7/85/88. Pt reports that she does get reflux d/t Trulicity.   Pt states that she has been on oral contraceptives since 2016 with no period. Pt stopped contraceptives in December 2023. Pt states her MD is trying to see if she is postmenopausal. I spoke with anesthesia APP Karoline Caldwell. Pt to come on 07/07/22 prior to seed being placed to have urine preg test done.   Blood Thinner Instructions: N/A Aspirin Instructions: N/A  ERAS Protcol -ERAS + G2   COVID TEST- N/A   Anesthesia review: follow up on requested records from PCP- office note and A1c result. Pt having seed placed on 07/07/22.   Patient denies shortness of breath, fever, cough and chest pain at PAT appointment   All instructions explained to the patient, with a verbal understanding of the material. Patient agrees to go over the instructions while at home for a better understanding. Patient also instructed to self quarantine after being tested for COVID-19. The opportunity to  ask questions was provided.

## 2022-07-01 NOTE — Progress Notes (Addendum)
Surgical Instructions    Your procedure is scheduled on Wednesday February 7th.  Report to [facility] Main Entrance "A" at 9 A.M., then check in with the Admitting office.  Call this number if you have problems the morning of surgery:  [phone]   If you have any questions prior to your surgery date call [phone]: Open Monday-Friday 8am-4pm If you experience any cold or flu symptoms such as cough, fever, chills, shortness of breath, etc. between now and your scheduled surgery, please notify us at the above number     Remember:  Do not eat after midnight the night before your surgery  You may drink clear liquids until 8am the morning of your surgery.   Clear liquids allowed are: Water, Non-Citrus Juices (without pulp), Carbonated Beverages, Clear Tea, Black Coffee ONLY (NO MILK, CREAM OR POWDERED CREAMER of any kind), and Gatorade  Patient Instructions  The night before surgery:  No food after midnight. ONLY clear liquids after midnight    The day of surgery: Drink ONE (1) 12 oz G2 given to you in your pre admission testing appointment by 8:00AM the morning of surgery. Drink in one sitting. Do not sip.  This drink was given to you during your hospital  pre-op appointment visit.  Nothing else to drink after completing the  12 oz bottle of G2.         If you have questions, please contact your surgeon's office.           If you have questions, please contact your surgeon's office.     Take these medicines the morning of surgery with A SIP OF WATER: carvedilol (COREG) 3.125 MG tablet  famotidine (PEPCID) 40 MG tablet  Levothyroxine Sodium 25 MCG CAPS  loratadine (CLARITIN) 10 MG tablet  montelukast (SINGULAIR) 10 MG tablet   IF NEEDED  fluticasone (FLONASE) 50 MCG/ACT nasal spray     As of today, STOP taking any Aspirin (unless otherwise instructed by your surgeon) MOBIC, Aleve, Naproxen, Ibuprofen, Motrin, Advil, Goody's, BC's, all herbal medications, fish oil,  and all vitamins.  WHAT DO I DO ABOUT MY DIABETES MEDICATION?   Do not take oral diabetes medicines (pills) the morning of surgery.  The day of surgery, do not take other diabetes injectables, including Byetta (exenatide), Bydureon (exenatide ER), Victoza (liraglutide), or Trulicity (dulaglutide).  STOP your Trulicity 7 days prior to surgery, last dose 06/30/22  If your CBG is greater than 220 mg/dL, you may take  of your sliding scale (correction) dose of insulin.   HOW TO MANAGE YOUR DIABETES BEFORE AND AFTER SURGERY  Why is it important to control my blood sugar before and after surgery? Improving blood sugar levels before and after surgery helps healing and can limit problems. A way of improving blood sugar control is eating a healthy diet by:  Eating less sugar and carbohydrates  Increasing activity/exercise  Talking with your doctor about reaching your blood sugar goals High blood sugars (greater than 180 mg/dL) can raise your risk of infections and slow your recovery, so you will need to focus on controlling your diabetes during the weeks before surgery. Make sure that the doctor who takes care of your diabetes knows about your planned surgery including the date and location.  How do I manage my blood sugar before surgery? Check your blood sugar at least 4 times a day, starting 2 days before surgery, to make sure that the level is not too high or low.  Check your blood  sugar the morning of your surgery when you wake up and every 2 hours until you get to the Short Stay unit.  If your blood sugar is less than 70 mg/dL, you will need to treat for low blood sugar: Do not take insulin. Treat a low blood sugar (less than 70 mg/dL) with  cup of clear juice (cranberry or apple), 4 glucose tablets, OR glucose gel. Recheck blood sugar in 15 minutes after treatment (to make sure it is greater than 70 mg/dL). If your blood sugar is not greater than 70 mg/dL on recheck, call [phone]  for further instructions. Report your blood sugar to the short stay nurse when you get to Short Stay.  If you are admitted to the hospital after surgery: Your blood sugar will be checked by the staff and you will probably be given insulin after surgery (instead of oral diabetes medicines) to make sure you have good blood sugar levels. The goal for blood sugar control after surgery is 80-180 mg/dL.   [facility] is not responsible for any belongings or valuables.    Do NOT Smoke (Tobacco/Vaping)  24 hours prior to your procedure  If you use a CPAP at night, you may bring your mask for your overnight stay.   Contacts, glasses, hearing aids, dentures or partials may not be worn into surgery, please bring cases for these belongings   For patients admitted to the hospital, discharge time will be determined by your treatment team.   Patients discharged the day of surgery will not be allowed to drive home, and someone needs to stay with them for 24 hours.   SURGICAL WAITING ROOM VISITATION Patients having surgery or a procedure may have no more than 2 support people in the waiting area - these visitors may rotate.   Children under the age of 41 must have an adult with them who is not the patient. If the patient needs to stay at the hospital during part of their recovery, the visitor guidelines for inpatient rooms apply. Pre-op nurse will coordinate an appropriate time for 1 support person to accompany patient in pre-op.  This support person may not rotate.   Please refer to [email or web address] for the visitor guidelines for Inpatients (after your surgery is over and you are in a regular room).    Special instructions:    Oral Hygiene is also important to reduce your risk of infection.  Remember - BRUSH YOUR TEETH THE MORNING OF SURGERY WITH YOUR REGULAR TOOTHPASTE   [facility]- Preparing For Surgery  Before surgery, you can play an  important role. Because skin is not sterile, your skin needs to be as free of germs as possible. You can reduce the number of germs on your skin by washing with CHG (chlorahexidine gluconate) Soap before surgery.  CHG is an antiseptic cleaner which kills germs and bonds with the skin to continue killing germs even after washing.     Please do not use if you have an allergy to CHG or antibacterial soaps. If your skin becomes reddened/irritated stop using the CHG.  Do not shave (including legs and underarms) for at least 48 hours prior to first CHG shower. It is OK to shave your face.  Please follow these instructions carefully.     Shower the NIGHT BEFORE SURGERY and the MORNING OF SURGERY with CHG Soap.   If you chose to wash your hair, wash your hair first as usual with your normal shampoo. After you shampoo, rinse your hair  and body thoroughly to remove the shampoo.  Then [employer] and genitals (private parts) with your normal soap and rinse thoroughly to remove soap.  After that Use CHG Soap as you would any other liquid soap. You can apply CHG directly to the skin and wash gently with a scrungie or a clean washcloth.   Apply the CHG Soap to your body ONLY FROM THE NECK DOWN.  Do not use on open wounds or open sores. Avoid contact with your eyes, ears, mouth and genitals (private parts). Wash Face and genitals (private parts)  with your normal soap.   Wash thoroughly, paying special attention to the area where your surgery will be performed.  Thoroughly rinse your body with warm water from the neck down.  DO NOT shower/wash with your normal soap after using and rinsing off the CHG Soap.  Pat yourself dry with a CLEAN TOWEL.  Wear CLEAN PAJAMAS to bed the night before surgery  Place CLEAN SHEETS on your bed the night before your surgery  DO NOT SLEEP WITH PETS.   Day of Surgery:  Take a shower with CHG soap. Wear Clean/Comfortable clothing the morning of surgery Do not wear  jewelry or makeup. Do not wear lotions, powders, perfumes or deodorant. Do not shave 48 hours prior to surgery.   Do not bring valuables to the hospital. Do not wear nail polish, gel polish, artificial nails, or any other type of covering on natural nails (fingers and toes) If you have artificial nails or gel coating that need to be removed by a nail salon, please have this removed prior to surgery. Artificial nails or gel coating may interfere with anesthesia's ability to adequately monitor your vital signs. Remember to brush your teeth WITH YOUR REGULAR TOOTHPASTE.    If you received a COVID test during your pre-op visit, it is requested that you wear a mask when out in public, stay away from anyone that may not be feeling well, and notify your surgeon if you develop symptoms. If you have been in contact with anyone that has tested positive in the last 10 days, please notify your surgeon.    Please read over the following fact sheets that you were given.

## 2022-07-02 DIAGNOSIS — Z170 Estrogen receptor positive status [ER+]: Secondary | ICD-10-CM

## 2022-07-02 HISTORY — DX: Malignant neoplasm of upper-outer quadrant of left female breast: Z17.0

## 2022-07-03 ENCOUNTER — Encounter: Payer: Self-pay | Admitting: *Deleted

## 2022-07-03 ENCOUNTER — Ambulatory Visit (HOSPITAL_COMMUNITY)
Admission: RE | Admit: 2022-07-03 | Discharge: 2022-07-03 | Disposition: A | Discharge status: Home / Self Care | Payer: BC Managed Care – PPO | Source: Ambulatory Visit | Attending: Hematology & Oncology | Admitting: Hematology & Oncology

## 2022-07-03 ENCOUNTER — Telehealth: Payer: Self-pay

## 2022-07-03 DIAGNOSIS — C50412 Malignant neoplasm of upper-outer quadrant of left female breast: Secondary | ICD-10-CM | POA: Insufficient documentation

## 2022-07-03 DIAGNOSIS — Z170 Estrogen receptor positive status [ER+]: Secondary | ICD-10-CM | POA: Insufficient documentation

## 2022-07-03 NOTE — Progress Notes (Signed)
Patient calling asking for interpretation of her [place]. Reviewed results with Dr Marin Olp who states the likely fatty liver is a result of her diabetes.   Notified patient of interpretation. If she desires further follow up this can be discussed with her PCP.   She then had multiple questions about possible upcoming chemo treatments. Answered to the best of my ability, reminding her that final treatment decisions will be made on final pathology. She was satisfied with answers and encouraged to call back if she had any addition questions.   Oncology Nurse Navigator Documentation     07/03/2022   12:30 PM  Oncology Nurse Navigator Flowsheets  Navigator Follow Up Date: 07/08/2022  Navigator Follow Up Reason: Surgery  Navigator Location [facility]  Navigator Encounter Type Telephone  Telephone Diagnostic Results;Education;Outgoing Call  Patient Visit Type MedOnc  Treatment Phase Pre-Tx/Tx Discussion  Barriers/Navigation Needs Coordination of Engineer, technical sales;Other  Interventions Education;Psycho-Social Support  Acuity Level 2-Minimal Needs (1-2 Barriers Identified)  Education Method Verbal  Support Groups/Services Friends and Family  Time Spent with Patient 45

## 2022-07-03 NOTE — Telephone Encounter (Signed)
-----   Message from Volanda Napoleon, MD sent at 07/03/2022 11:37 AM EST ----- Call and let her know that the liver does not show any abnormality outside of that of fatty infiltration.  This is quite common these days.  Thanks.Holly Leach

## 2022-07-06 ENCOUNTER — Inpatient Hospital Stay: Payer: BC Managed Care – PPO | Admitting: Nutrition

## 2022-07-06 ENCOUNTER — Other Ambulatory Visit: Payer: Self-pay

## 2022-07-06 ENCOUNTER — Inpatient Hospital Stay: Payer: BC Managed Care – PPO | Attending: Hematology & Oncology | Admitting: Nutrition

## 2022-07-06 DIAGNOSIS — C50912 Malignant neoplasm of unspecified site of left female breast: Secondary | ICD-10-CM | POA: Insufficient documentation

## 2022-07-06 DIAGNOSIS — Z170 Estrogen receptor positive status [ER+]: Secondary | ICD-10-CM | POA: Insufficient documentation

## 2022-07-06 NOTE — Progress Notes (Signed)
52 year old female diagnosed with Breast Cancer and followed by Dr. Marin Olp and Dr. Isidore Moos. Treatment plan is pending. Lumpectomy planned for Feb 7. Patient is concerned she will need chemotherapy and wants to be prepared.  PMH includes H/A, HLD, HTN, OSA on C-pap, Diabetes, Thyroid disease.  Medications include Pepcid, Trulicity, Vit D, Xanax, Collagen, Probiotic/Prebiotic.  Labs include Glucose 100, Creatinine 1.08  Height: 5'3" Weight: 225 # on Jan 31 UBW: 210 # in 2023. BMI: 40.03 (obese)  Patient would like more information about eating during treatment. She is concerned because she states Trulicity has caused a poor appetite. She has a lot of questions around protein foods, sugar and cancer, appropriate snacks and wt loss. She wants to know which protein shakes would be the best for her.  Nutrition Diagnosis: Food and Nutrition related Knowledge Deficit related to breast cancer as evidenced by no prior need for nutrition related information.  Intervention: Educated on importance of small frequent meals and snacks.  Reviewed importance of healthy food choices and a balance of protein, fat and healthy complex carbohydrates. Gave handouts on plant based diet. Reviewed guidelines for glycemic control during oncology treatment which is a recommendation for CBGs to be 180 or below.  Encouraged her to discuss oral diabetes medications with her doctor to determine which medication is best for her. Educated on high protein foods and provided nutrition fact sheets. Reviewed protein shakes and encouraged her to drink 1-2 if needed. Educated on cancer and sugar. Provided detailed handouts. Explained that nutrition impact symptoms will be based on treatment plan and it is best to wait to educate on "potential" side effects since we don't have a plan yet.  Monitoring,Evaluation, Goals: Patient will follow a healthy balanced plant based diet for weight maintenance/slow, safe weight loss during  treatment.  No follow up at this time. Patient has contact information for questions.

## 2022-07-07 ENCOUNTER — Encounter (HOSPITAL_COMMUNITY)
Admission: RE | Admit: 2022-07-07 | Discharge: 2022-07-07 | Disposition: A | Discharge status: Home / Self Care | Payer: BC Managed Care – PPO | Source: Ambulatory Visit | Attending: General Surgery | Admitting: General Surgery

## 2022-07-07 ENCOUNTER — Ambulatory Visit
Admission: RE | Admit: 2022-07-07 | Discharge: 2022-07-07 | Disposition: A | Discharge status: Home / Self Care | Payer: BC Managed Care – PPO | Source: Ambulatory Visit | Attending: General Surgery | Admitting: General Surgery

## 2022-07-07 DIAGNOSIS — Z170 Estrogen receptor positive status [ER+]: Secondary | ICD-10-CM | POA: Diagnosis not present

## 2022-07-07 DIAGNOSIS — D0512 Intraductal carcinoma in situ of left breast: Secondary | ICD-10-CM | POA: Diagnosis not present

## 2022-07-07 DIAGNOSIS — Z01818 Encounter for other preprocedural examination: Secondary | ICD-10-CM

## 2022-07-07 DIAGNOSIS — K219 Gastro-esophageal reflux disease without esophagitis: Secondary | ICD-10-CM | POA: Diagnosis not present

## 2022-07-07 DIAGNOSIS — E039 Hypothyroidism, unspecified: Secondary | ICD-10-CM | POA: Diagnosis not present

## 2022-07-07 DIAGNOSIS — Z01812 Encounter for preprocedural laboratory examination: Secondary | ICD-10-CM | POA: Insufficient documentation

## 2022-07-07 DIAGNOSIS — C50412 Malignant neoplasm of upper-outer quadrant of left female breast: Secondary | ICD-10-CM | POA: Diagnosis present

## 2022-07-07 DIAGNOSIS — Z7985 Long-term (current) use of injectable non-insulin antidiabetic drugs: Secondary | ICD-10-CM | POA: Diagnosis not present

## 2022-07-07 DIAGNOSIS — E785 Hyperlipidemia, unspecified: Secondary | ICD-10-CM | POA: Diagnosis not present

## 2022-07-07 DIAGNOSIS — Z6841 Body Mass Index (BMI) 40.0 and over, adult: Secondary | ICD-10-CM | POA: Diagnosis not present

## 2022-07-07 DIAGNOSIS — Z79899 Other long term (current) drug therapy: Secondary | ICD-10-CM | POA: Diagnosis not present

## 2022-07-07 DIAGNOSIS — I10 Essential (primary) hypertension: Secondary | ICD-10-CM | POA: Diagnosis not present

## 2022-07-07 DIAGNOSIS — Z793 Long term (current) use of hormonal contraceptives: Secondary | ICD-10-CM | POA: Diagnosis not present

## 2022-07-07 HISTORY — PX: BREAST BIOPSY: SHX20

## 2022-07-07 LAB — POCT PREGNANCY, URINE: Preg Test, Ur: NEGATIVE

## 2022-07-07 NOTE — Anesthesia Preprocedure Evaluation (Signed)
Anesthesia Evaluation    Reviewed: Allergy & Precautions, Patient's Chart, lab work & pertinent test results  Airway Mallampati: III  TM Distance: >3 FB Neck ROM: Full    Dental no notable dental hx. (+) Dental Advisory Given   Pulmonary PE   Pulmonary exam normal        Cardiovascular hypertension, Pt. on medications Normal cardiovascular exam     Neuro/Psych  Headaches  negative psych ROS   GI/Hepatic negative GI ROS, Neg liver ROS,,,  Endo/Other  diabetesHypothyroidism  Morbid obesity  Renal/GU negative Renal ROS     Musculoskeletal negative musculoskeletal ROS (+)    Abdominal  (+) + obese  Peds  Hematology negative hematology ROS (+)   Anesthesia Other Findings   Reproductive/Obstetrics hcg negative                             Anesthesia Physical Anesthesia Plan  ASA: 3  Anesthesia Plan: General   Post-op Pain Management: Regional block* and Tylenol PO (pre-op)*   Induction: Intravenous  PONV Risk Score and Plan: 4 or greater and Ondansetron, Dexamethasone, Midazolam, Scopolamine patch - Pre-op and Treatment may vary due to age or medical condition  Airway Management Planned: Oral ETT and LMA  Additional Equipment:   Intra-op Plan:   Post-operative Plan: Extubation in OR  Informed Consent: I have reviewed the patients History and Physical, chart, labs and discussed the procedure including the risks, benefits and alternatives for the proposed anesthesia with the patient or authorized representative who has indicated his/her understanding and acceptance.     Dental advisory given  Plan Discussed with: Anesthesiologist and CRNA  Anesthesia Plan Comments:         Anesthesia Quick Evaluation

## 2022-07-07 NOTE — Progress Notes (Signed)
07/07/22 upt is negative per Caryl Pina in [facility].

## 2022-07-08 ENCOUNTER — Ambulatory Visit
Admission: RE | Admit: 2022-07-08 | Discharge: 2022-07-08 | Disposition: A | Discharge status: Home / Self Care | Payer: BC Managed Care – PPO | Source: Ambulatory Visit | Attending: General Surgery | Admitting: General Surgery

## 2022-07-08 ENCOUNTER — Ambulatory Visit (HOSPITAL_COMMUNITY): Payer: BC Managed Care – PPO | Admitting: Physician Assistant

## 2022-07-08 ENCOUNTER — Ambulatory Visit (HOSPITAL_COMMUNITY)
Admission: RE | Admit: 2022-07-08 | Discharge: 2022-07-08 | Disposition: A | Discharge status: Home / Self Care | Payer: BC Managed Care – PPO | Attending: General Surgery | Admitting: General Surgery

## 2022-07-08 ENCOUNTER — Encounter (HOSPITAL_COMMUNITY)
Admission: RE | Disposition: A | Discharge status: Home / Self Care | Payer: Self-pay | Source: Home / Self Care | Attending: General Surgery

## 2022-07-08 ENCOUNTER — Other Ambulatory Visit: Payer: Self-pay

## 2022-07-08 ENCOUNTER — Encounter: Payer: Self-pay | Admitting: *Deleted

## 2022-07-08 ENCOUNTER — Encounter (HOSPITAL_COMMUNITY): Payer: Self-pay | Admitting: General Surgery

## 2022-07-08 DIAGNOSIS — E785 Hyperlipidemia, unspecified: Secondary | ICD-10-CM | POA: Insufficient documentation

## 2022-07-08 DIAGNOSIS — I10 Essential (primary) hypertension: Secondary | ICD-10-CM | POA: Insufficient documentation

## 2022-07-08 DIAGNOSIS — D0512 Intraductal carcinoma in situ of left breast: Secondary | ICD-10-CM | POA: Diagnosis not present

## 2022-07-08 DIAGNOSIS — Z170 Estrogen receptor positive status [ER+]: Secondary | ICD-10-CM | POA: Insufficient documentation

## 2022-07-08 DIAGNOSIS — Z793 Long term (current) use of hormonal contraceptives: Secondary | ICD-10-CM | POA: Insufficient documentation

## 2022-07-08 DIAGNOSIS — Z79899 Other long term (current) drug therapy: Secondary | ICD-10-CM | POA: Insufficient documentation

## 2022-07-08 DIAGNOSIS — E039 Hypothyroidism, unspecified: Secondary | ICD-10-CM | POA: Insufficient documentation

## 2022-07-08 DIAGNOSIS — Z7985 Long-term (current) use of injectable non-insulin antidiabetic drugs: Secondary | ICD-10-CM | POA: Insufficient documentation

## 2022-07-08 DIAGNOSIS — Z01818 Encounter for other preprocedural examination: Secondary | ICD-10-CM

## 2022-07-08 DIAGNOSIS — C50412 Malignant neoplasm of upper-outer quadrant of left female breast: Secondary | ICD-10-CM

## 2022-07-08 DIAGNOSIS — Z6841 Body Mass Index (BMI) 40.0 and over, adult: Secondary | ICD-10-CM | POA: Insufficient documentation

## 2022-07-08 DIAGNOSIS — E119 Type 2 diabetes mellitus without complications: Secondary | ICD-10-CM

## 2022-07-08 DIAGNOSIS — K219 Gastro-esophageal reflux disease without esophagitis: Secondary | ICD-10-CM | POA: Insufficient documentation

## 2022-07-08 HISTORY — PX: BREAST LUMPECTOMY WITH RADIOACTIVE SEED AND SENTINEL LYMPH NODE BIOPSY: SHX6550

## 2022-07-08 LAB — GLUCOSE, CAPILLARY: Glucose-Capillary: 116 mg/dL — ABNORMAL HIGH (ref 70–99)

## 2022-07-08 SURGERY — BREAST LUMPECTOMY WITH RADIOACTIVE SEED AND SENTINEL LYMPH NODE BIOPSY
Anesthesia: General | Site: Breast | Laterality: Left

## 2022-07-08 MED ORDER — INSULIN ASPART 100 UNIT/ML IJ SOLN: 0.0000 [IU] | INTRAMUSCULAR | Status: DC | PRN

## 2022-07-08 MED ORDER — BUPIVACAINE LIPOSOME 1.3 % IJ SUSP
INTRAMUSCULAR | Status: DC | PRN
  Administered 2022-07-08: 10 mL

## 2022-07-08 MED ORDER — ENSURE PRE-SURGERY PO LIQD: 296.0000 mL | Freq: Once | ORAL | Status: DC

## 2022-07-08 MED ORDER — ACETAMINOPHEN 500 MG PO TABS
1000.0000 mg | ORAL_TABLET | ORAL | Status: AC
  Administered 2022-07-08: 1000 mg via ORAL

## 2022-07-08 MED ORDER — MIDAZOLAM HCL 2 MG/2ML IJ SOLN
INTRAMUSCULAR | Status: AC
  Administered 2022-07-08: 2 mg via INTRAVENOUS
  Filled 2022-07-08: qty 2

## 2022-07-08 MED ORDER — PHENYLEPHRINE 80 MCG/ML (10ML) SYRINGE FOR IV PUSH (FOR BLOOD PRESSURE SUPPORT)
PREFILLED_SYRINGE | INTRAVENOUS | Status: DC | PRN
  Administered 2022-07-08 (×2): 80 ug via INTRAVENOUS
  Administered 2022-07-08: 160 ug via INTRAVENOUS
  Administered 2022-07-08 (×2): 80 ug via INTRAVENOUS

## 2022-07-08 MED ORDER — DEXAMETHASONE SODIUM PHOSPHATE 10 MG/ML IJ SOLN
INTRAMUSCULAR | Status: AC
  Filled 2022-07-08: qty 1

## 2022-07-08 MED ORDER — BUPIVACAINE LIPOSOME 1.3 % IJ SUSP
INTRAMUSCULAR | Status: AC
  Filled 2022-07-08: qty 10

## 2022-07-08 MED ORDER — PROPOFOL 10 MG/ML IV BOLUS
INTRAVENOUS | Status: AC
  Filled 2022-07-08: qty 20

## 2022-07-08 MED ORDER — FENTANYL CITRATE (PF) 100 MCG/2ML IJ SOLN
25.0000 ug | INTRAMUSCULAR | Status: DC | PRN
  Administered 2022-07-08: 25 ug via INTRAVENOUS

## 2022-07-08 MED ORDER — PROPOFOL 10 MG/ML IV BOLUS
INTRAVENOUS | Status: DC | PRN
  Administered 2022-07-08: 190 mg via INTRAVENOUS

## 2022-07-08 MED ORDER — FENTANYL CITRATE (PF) 100 MCG/2ML IJ SOLN: 100.0000 ug | Freq: Once | INTRAMUSCULAR | Status: AC

## 2022-07-08 MED ORDER — FENTANYL CITRATE (PF) 250 MCG/5ML IJ SOLN
INTRAMUSCULAR | Status: DC | PRN
  Administered 2022-07-08: 50 ug via INTRAVENOUS
  Administered 2022-07-08 (×2): 25 ug via INTRAVENOUS

## 2022-07-08 MED ORDER — ONDANSETRON HCL 4 MG/2ML IJ SOLN
INTRAMUSCULAR | Status: AC
  Filled 2022-07-08: qty 2

## 2022-07-08 MED ORDER — MIDAZOLAM HCL 2 MG/2ML IJ SOLN: 2.0000 mg | Freq: Once | INTRAMUSCULAR | Status: AC

## 2022-07-08 MED ORDER — LIDOCAINE-EPINEPHRINE 1 %-1:100000 IJ SOLN
INTRAMUSCULAR | Status: AC
  Filled 2022-07-08: qty 1

## 2022-07-08 MED ORDER — CHLORHEXIDINE GLUCONATE 0.12 % MT SOLN
15.0000 mL | Freq: Once | OROMUCOSAL | Status: AC
  Administered 2022-07-08: 15 mL via OROMUCOSAL
  Filled 2022-07-08: qty 15

## 2022-07-08 MED ORDER — FENTANYL CITRATE (PF) 100 MCG/2ML IJ SOLN
INTRAMUSCULAR | Status: AC
  Administered 2022-07-08: 100 ug via INTRAVENOUS
  Filled 2022-07-08: qty 2

## 2022-07-08 MED ORDER — ACETAMINOPHEN 500 MG PO TABS
1000.0000 mg | ORAL_TABLET | Freq: Once | ORAL | Status: AC
  Filled 2022-07-08: qty 2

## 2022-07-08 MED ORDER — 0.9 % SODIUM CHLORIDE (POUR BTL) OPTIME
TOPICAL | Status: DC | PRN
  Administered 2022-07-08: 1000 mL

## 2022-07-08 MED ORDER — ONDANSETRON HCL 4 MG/2ML IJ SOLN
INTRAMUSCULAR | Status: DC | PRN
  Administered 2022-07-08: 4 mg via INTRAVENOUS

## 2022-07-08 MED ORDER — OXYCODONE HCL 5 MG PO TABS
ORAL_TABLET | ORAL | Status: AC
  Filled 2022-07-08: qty 1

## 2022-07-08 MED ORDER — LACTATED RINGERS IV SOLN: INTRAVENOUS | Status: DC

## 2022-07-08 MED ORDER — AMISULPRIDE (ANTIEMETIC) 5 MG/2ML IV SOLN: 10.0000 mg | Freq: Once | INTRAVENOUS | Status: DC | PRN

## 2022-07-08 MED ORDER — CIPROFLOXACIN IN D5W 400 MG/200ML IV SOLN
400.0000 mg | INTRAVENOUS | Status: AC
  Administered 2022-07-08: 400 mg via INTRAVENOUS
  Filled 2022-07-08: qty 200

## 2022-07-08 MED ORDER — CHLORHEXIDINE GLUCONATE CLOTH 2 % EX PADS: 6.0000 | MEDICATED_PAD | Freq: Once | CUTANEOUS | Status: DC

## 2022-07-08 MED ORDER — FENTANYL CITRATE (PF) 100 MCG/2ML IJ SOLN
INTRAMUSCULAR | Status: AC
  Filled 2022-07-08: qty 2

## 2022-07-08 MED ORDER — DIPHENHYDRAMINE HCL 50 MG/ML IJ SOLN
INTRAMUSCULAR | Status: DC | PRN
  Administered 2022-07-08: 12.5 mg via INTRAVENOUS

## 2022-07-08 MED ORDER — DEXAMETHASONE SODIUM PHOSPHATE 10 MG/ML IJ SOLN
INTRAMUSCULAR | Status: DC | PRN
  Administered 2022-07-08: 10 mg via INTRAVENOUS

## 2022-07-08 MED ORDER — OXYCODONE HCL 5 MG/5ML PO SOLN: 5.0000 mg | Freq: Once | ORAL | Status: AC | PRN

## 2022-07-08 MED ORDER — BUPIVACAINE HCL (PF) 0.5 % IJ SOLN
INTRAMUSCULAR | Status: DC | PRN
  Administered 2022-07-08: 10 mL

## 2022-07-08 MED ORDER — OXYCODONE HCL 5 MG PO TABS: 5.0000 mg | ORAL_TABLET | Freq: Four times a day (QID) | ORAL | 0 refills | Status: DC | PRN

## 2022-07-08 MED ORDER — PROMETHAZINE HCL 25 MG/ML IJ SOLN: 6.2500 mg | INTRAMUSCULAR | Status: DC | PRN

## 2022-07-08 MED ORDER — MAGTRACE LYMPHATIC TRACER
INTRAMUSCULAR | Status: DC | PRN
  Administered 2022-07-08: 2 mL via INTRAMUSCULAR

## 2022-07-08 MED ORDER — ORAL CARE MOUTH RINSE: 15.0000 mL | Freq: Once | OROMUCOSAL | Status: AC

## 2022-07-08 MED ORDER — LIDOCAINE-EPINEPHRINE 1 %-1:100000 IJ SOLN
INTRAMUSCULAR | Status: DC | PRN
  Administered 2022-07-08: 10 mL

## 2022-07-08 MED ORDER — BUPIVACAINE HCL (PF) 0.25 % IJ SOLN
INTRAMUSCULAR | Status: AC
  Filled 2022-07-08: qty 30

## 2022-07-08 MED ORDER — FENTANYL CITRATE (PF) 250 MCG/5ML IJ SOLN
INTRAMUSCULAR | Status: AC
  Filled 2022-07-08: qty 5

## 2022-07-08 MED ORDER — OXYCODONE HCL 5 MG PO TABS
5.0000 mg | ORAL_TABLET | Freq: Once | ORAL | Status: AC | PRN
  Administered 2022-07-08: 5 mg via ORAL

## 2022-07-08 SURGICAL SUPPLY — 47 items
BAG COUNTER SPONGE SURGICOUNT (BAG) ×1 IMPLANT
BINDER BREAST LRG (GAUZE/BANDAGES/DRESSINGS) IMPLANT
BINDER BREAST XLRG (GAUZE/BANDAGES/DRESSINGS) IMPLANT
BINDER BREAST XXLRG (GAUZE/BANDAGES/DRESSINGS) IMPLANT
BNDG COHESIVE 4X5 TAN STRL (GAUZE/BANDAGES/DRESSINGS) ×1 IMPLANT
CANISTER SUCT 3000ML PPV (MISCELLANEOUS) ×1 IMPLANT
CHLORAPREP W/TINT 26 (MISCELLANEOUS) ×1 IMPLANT
CLIP TI LARGE 6 (CLIP) ×1 IMPLANT
CLIP TI MEDIUM 24 (CLIP) ×1 IMPLANT
CNTNR URN SCR LID CUP LEK RST (MISCELLANEOUS) IMPLANT
CONT SPEC 4OZ STRL OR WHT (MISCELLANEOUS)
COVER PROBE W GEL 5X96 (DRAPES) ×2 IMPLANT
COVER SURGICAL LIGHT HANDLE (MISCELLANEOUS) ×1 IMPLANT
DERMABOND ADVANCED .7 DNX12 (GAUZE/BANDAGES/DRESSINGS) ×1 IMPLANT
DEVICE DUBIN SPECIMEN MAMMOGRA (MISCELLANEOUS) IMPLANT
DRAPE CHEST BREAST 15X10 FENES (DRAPES) ×1 IMPLANT
DRAPE SURG 17X23 STRL (DRAPES) IMPLANT
ELECT COATED BLADE 2.86 ST (ELECTRODE) ×1 IMPLANT
ELECT REM PT RETURN 9FT ADLT (ELECTROSURGICAL) ×1
ELECTRODE REM PT RTRN 9FT ADLT (ELECTROSURGICAL) ×1 IMPLANT
GAUZE PAD ABD 8X10 STRL (GAUZE/BANDAGES/DRESSINGS) IMPLANT
GAUZE SPONGE 4X4 12PLY STRL (GAUZE/BANDAGES/DRESSINGS) IMPLANT
GLOVE BIO SURGEON STRL SZ 6 (GLOVE) ×1 IMPLANT
GLOVE INDICATOR 6.5 STRL GRN (GLOVE) ×1 IMPLANT
GOWN STRL REUS W/ TWL LRG LVL3 (GOWN DISPOSABLE) ×1 IMPLANT
GOWN STRL REUS W/ TWL XL LVL3 (GOWN DISPOSABLE) ×1 IMPLANT
GOWN STRL REUS W/TWL LRG LVL3 (GOWN DISPOSABLE)
GOWN STRL REUS W/TWL XL LVL3 (GOWN DISPOSABLE) ×1
KIT BASIN OR (CUSTOM PROCEDURE TRAY) ×1 IMPLANT
KIT MARKER MARGIN INK (KITS) ×1 IMPLANT
LIGHT WAVEGUIDE WIDE FLAT (MISCELLANEOUS) IMPLANT
NDL 18GX1X1/2 (RX/OR ONLY) (NEEDLE) IMPLANT
NDL FILTER BLUNT 18X1 1/2 (NEEDLE) IMPLANT
NDL HYPO 25GX1X1/2 BEV (NEEDLE) ×1 IMPLANT
NEEDLE 18GX1X1/2 (RX/OR ONLY) (NEEDLE) IMPLANT
NEEDLE FILTER BLUNT 18X1 1/2 (NEEDLE) ×1 IMPLANT
NEEDLE HYPO 25GX1X1/2 BEV (NEEDLE) ×1 IMPLANT
NS IRRIG 1000ML POUR BTL (IV SOLUTION) ×1 IMPLANT
PACK GENERAL/GYN (CUSTOM PROCEDURE TRAY) ×1 IMPLANT
PACK UNIVERSAL I (CUSTOM PROCEDURE TRAY) ×1 IMPLANT
STOCKINETTE IMPERVIOUS 9X36 MD (GAUZE/BANDAGES/DRESSINGS) ×1 IMPLANT
STRIP CLOSURE SKIN 1/2X4 (GAUZE/BANDAGES/DRESSINGS) ×1 IMPLANT
SUT MNCRL AB 4-0 PS2 18 (SUTURE) ×1 IMPLANT
SUT VIC AB 3-0 SH 8-18 (SUTURE) ×1 IMPLANT
SYR CONTROL 10ML LL (SYRINGE) ×1 IMPLANT
TOWEL GREEN STERILE (TOWEL DISPOSABLE) ×1 IMPLANT
TOWEL GREEN STERILE FF (TOWEL DISPOSABLE) ×1 IMPLANT

## 2022-07-08 NOTE — Transfer of Care (Signed)
Immediate Anesthesia Transfer of Care Note  Patient: Holly Leach  Procedure(s) Performed: LEFT BREAST LUMPECTOMY WITH RADIOACTIVE SEED AND SENTINEL LYMPH NODE BIOPSY (Left: Breast)  Patient Location: PACU  Anesthesia Type:GA combined with regional for post-op pain  Level of Consciousness: awake and alert   Airway & Oxygen Therapy: Patient Spontanous Breathing and Patient connected to face mask oxygen  Post-op Assessment: Report given to RN and Post -op Vital signs reviewed and stable  Post vital signs: Reviewed and stable  Last Vitals:  Vitals Value Taken Time  BP 104/50 07/08/22 1247  Temp 36.7 C 07/08/22 1245  Pulse 82 07/08/22 1251  Resp 18 07/08/22 1251  SpO2 90 % 07/08/22 1251  Vitals shown include unvalidated device data.  Last Pain:  Vitals:   07/08/22 0939  TempSrc:   PainSc: 5       Patients Stated Pain Goal: 0 (49/32/41 9914)  Complications: No notable events documented.

## 2022-07-08 NOTE — Anesthesia Procedure Notes (Signed)
Anesthesia Regional Block: Pectoralis block   Pre-Anesthetic Checklist: , timeout performed,  Correct Patient, Correct Site, Correct Laterality,  Correct Procedure, Correct Position, site marked,  Risks and benefits discussed,  Surgical consent,  Pre-op evaluation,  At surgeon's request and post-op pain management  Laterality: Left  Prep: chloraprep       Needles:  Injection technique: Single-shot  Needle Type: Echogenic Stimulator Needle     Needle Length: 10cm  Needle Gauge: 21     Additional Needles:   Procedures:,,,, ultrasound used (permanent image in chart),,    Narrative:  Start time: 07/08/2022 10:31 AM End time: 07/08/2022 10:41 AM Injection made incrementally with aspirations every 5 mL.  Performed by: Personally

## 2022-07-08 NOTE — Discharge Instructions (Addendum)
[facility] Office Phone Number [phone]  BREAST BIOPSY/ PARTIAL MASTECTOMY: POST OP INSTRUCTIONS  Always review your discharge instruction sheet given to you by the facility where your surgery was performed.  IF YOU HAVE DISABILITY OR FAMILY LEAVE FORMS, YOU MUST BRING THEM TO THE OFFICE FOR PROCESSING.  DO NOT GIVE THEM TO YOUR DOCTOR.  Take 2 tylenol (acetominophen) three times a day for 3 days.  If you still have pain, add ibuprofen with food in between if able to take this (if you have kidney issues or stomach issues, do not take ibuprofen).  If both of those are not enough, add the narcotic pain pill.  If you find you are needing a lot of this overnight after surgery, call the next morning for a refill.    Prescriptions will not be filled after 5pm or on week-ends. Take your usually prescribed medications unless otherwise directed You should eat very light the first 24 hours after surgery, such as soup, crackers, pudding, etc.  Resume your normal diet the day after surgery. Most patients will experience some swelling and bruising in the breast.  Ice packs and a good support bra will help.  Swelling and bruising can take several days to resolve.  It is common to experience some constipation if taking pain medication after surgery.  Increasing fluid intake and taking a stool softener will usually help or prevent this problem from occurring.  A mild laxative (Milk of Magnesia or Miralax) should be taken according to package directions if there are no bowel movements after 48 hours. Unless discharge instructions indicate otherwise, you may remove your bandages 48 hours after surgery, and you may shower at that time.  You may have steri-strips (small skin tapes) in place directly over the incision.  These strips should be left on the skin at least for for 7-10 days.    ACTIVITIES:  You may resume regular daily activities (gradually increasing) beginning the next day.  Wearing a  good support bra or sports bra (or the breast binder) minimizes pain and swelling.  You may have sexual intercourse when it is comfortable. No heavy lifting for 1-2 weeks (not over around 10 pounds).  You may drive when you no longer are taking prescription pain medication, you can comfortably wear a seatbelt, and you can safely maneuver your car and apply brakes. RETURN TO WORK:  __________3-14 days depending on job. _______________ You should see your doctor in the office for a follow-up appointment approximately two weeks after your surgery.  Your doctor's nurse will typically make your follow-up appointment when she calls you with your pathology report.  Expect your pathology report 3-4 business days after your surgery.  You may call to check if you do not hear from us after three days.   WHEN TO CALL YOUR DOCTOR: Fever over 101.0 Nausea and/or vomiting. Extreme swelling or bruising. Continued bleeding from incision. Increased pain, redness, or drainage from the incision.  The clinic staff is available to answer your questions during regular business hours.  Please don't hesitate to call and ask to speak to one of the nurses for clinical concerns.  If you have a medical emergency, go to the nearest emergency room or call 911.  A surgeon from [facility] is always on call at the hospital.  For further questions, please visit [email or web address]   

## 2022-07-08 NOTE — Anesthesia Postprocedure Evaluation (Signed)
Anesthesia Post Note  Patient: Holly Leach  Procedure(s) Performed: LEFT BREAST LUMPECTOMY WITH RADIOACTIVE SEED AND SENTINEL LYMPH NODE BIOPSY (Left: Breast)     Patient location during evaluation: PACU Anesthesia Type: General Level of consciousness: sedated and patient cooperative Pain management: pain level controlled Vital Signs Assessment: post-procedure vital signs reviewed and stable Respiratory status: spontaneous breathing Cardiovascular status: stable Anesthetic complications: no   No notable events documented.  Last Vitals:  Vitals:   07/08/22 1300 07/08/22 1315  BP: (!) 143/80 106/60  Pulse: 69 76  Resp: 17 16  Temp:  36.7 C  SpO2: 94% 96%    Last Pain:  Vitals:   07/08/22 1245  TempSrc:   PainSc: 0-No pain                 Nolon Nations

## 2022-07-08 NOTE — Progress Notes (Signed)
Patient had L breast lumpectomy with [facility]. Will follow for path results.   Oncology Nurse Navigator Documentation     07/08/2022   11:00 AM  Oncology Nurse Navigator Flowsheets  Surgery Actual Start Date: 07/08/2022  Navigator Follow Up Date: 07/13/2022  Navigator Follow Up Reason: Pathology  Navigator Location [facility]  Navigator Encounter Type Appt/Treatment Plan Review  Treatment Initiated Date 07/08/2022  Patient Visit Type MedOnc  Treatment Phase Active Tx  Barriers/Navigation Needs Coordination of Care;Education  Interventions None Required  Acuity Level 2-Minimal Needs (1-2 Barriers Identified)  Support Groups/Services Friends and Family  Time Spent with Patient 15

## 2022-07-08 NOTE — H&P (Signed)
REFERRING PHYSICIAN: Bovard  PROVIDER: Georgianne Fick, MD  Care Team: Patient Care Team: Hayden Rasmussen, MD as PCP - General (Family Medicine) Georgianne Fick, MD as Consulting Provider (Surgical Oncology) Janyth Contes, MD (Obstetrics and Gynecology)  MRN: [id number] DOB: [birth year] DATE OF ENCOUNTER: 06/23/2022  Subjective  Chief Complaint: Breast Cancer   History of Present Illness: Holly Leach is a 52 y.o. female who is seen today as an office consultation at the request of Dr. Sandford Craze for evaluation of Breast Cancer   Has a new diagnosis of left breast cancer January 2024. She had a screening detected left breast mass. Diagnostic imaging was performed which demonstrated a 1.4 cm mass at 2:00 14 cm from the nipple. No suspicious lymphadenopathy was seen. 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 Prognostic panel is still pending. The patient has not required breast interventions in the past. She does not have any personal history of breast cancer. She has been on oral contraceptives, but she stopped them with this diagnosis. She has been working with her gynecologist because is felt that she is most likely perimenopausal. This is being addressed.  The patient would like to see Dr. Marin Olp as her husband's father saw him and had a good experience. Also the patient saw him for a hypercoagulable workup at some point in the past.  The patient had a ventral umbilical hernia repair with Dr. Redmond Pulling.  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  Diagnostic mammogram/us BCG 06/12/2022 ACR Breast Density Category b: There are scattered areas of fibroglandular density.  FINDINGS: There is a persistent round, spiculated hyperdense mass in the upper outer left breast at posterior depth. Further evaluation with ultrasound  was performed.  Targeted ultrasound is performed, showing an irregular, hypoechoic mass at the 2 o'clock position 14 cm from the nipple. It measures 1.4 x 1.3 x 1.1 cm. There is no definite vascularity. Evaluation of the left axilla demonstrates no suspicious lymphadenopathy.  IMPRESSION: 1. Suspicious left breast mass corresponding with the screening mammographic findings. Recommend ultrasound-guided biopsy. 2. No suspicious left axillary lymphadenopathy.  RECOMMENDATION: Ultrasound-guided biopsy of the left breast.  I have discussed the findings and recommendations with the patient. If applicable, a reminder letter will be sent to the patient regarding the next appointment.  BI-RADS CATEGORY 5: Highly suggestive of malignancy.  Pathology core needle biopsy: 06/19/2022 Breast, left, needle core biopsy, 2:00, 14 cmfn, coil clip INVASIVE DUCTAL CARCINOMA, SEE NOTE TUBULE FORMATION: SCORE 3 NUCLEAR PLEOMORPHISM: SCORE 2 MITOTIC COUNT: SCORE 2 TOTAL SCORE: 7 OVERALL GRADE: 2 LYMPHOVASCULAR INVASION: NOT IDENTIFIED CANCER LENGTH: 0.6 CM CALCIFICATIONS: NOT IDENTIFIED  Receptors: **PENDING. **  Review of Systems: A complete review of systems was obtained from the patient. I have reviewed this information and discussed as appropriate with the patient. See HPI as well for other ROS.  Medical History: Past Medical History: Diagnosis Date Hypertension Sleep apnea Thyroid disease  Patient Active Problem List Diagnosis Class 1 obesity with body mass index (BMI) of 30.0 to 30.9 in adult, unspecified obesity type, unspecified whether serious comorbidity present Hypertension Breast cancer of upper-outer quadrant of left female breast (CMS-HCC)  Past Surgical History: Procedure Laterality Date HERNIA REPAIR   Allergies Allergen Reactions Augmentin [Amoxicillin-Pot Clavulanate] Diarrhea and Other (See Comments) Yeast infection Yeast Infection Yeast infection   Current  Outpatient Medications on File Prior to Visit Medication Sig Dispense  Refill carvediloL (COREG) 3.125 MG tablet ergocalciferol, vitamin D2, 1,250 mcg (50,000 unit) capsule montelukast (SINGULAIR) 10 mg tablet TRULICITY 1.74 BS/4.9 mL subcutaneous pen injector INJECT 0.'75MG'$  UNDER THE SKIN ONCE WEEKLY levonorgestrel-ethinyl estradiol (LESSINA) 0.1-20 mg-mcg tablet Take by mouth levothyroxine 25 mcg Cap Take by mouth lisinopriL-hydrochlorothiazide (ZESTORETIC) 20-25 mg tablet lisinopril 20 mg-hydrochlorothiazide 25 mg tablet loratadine (CLARITIN) 10 mg tablet Take 10 mg by mouth once daily  No current facility-administered medications on file prior to visit.  Family History Problem Relation Age of Onset High blood pressure (Hypertension) Mother High blood pressure (Hypertension) Father High blood pressure (Hypertension) Sister High blood pressure (Hypertension) Brother Hyperlipidemia (Elevated cholesterol) Brother   Social History  Tobacco Use Smoking Status Never Smokeless Tobacco Never   Social History  Socioeconomic History Marital status: Unknown Tobacco Use Smoking status: Never Smokeless tobacco: Never Substance and Sexual Activity Alcohol use: Never Drug use: Never  Objective:  There were no vitals filed for this visit. There is no height or weight on file to calculate BMI.  Gen: No acute distress. Well nourished and well groomed. Neurological: Alert and oriented to person, place, and time. Coordination normal. Head: Normocephalic and atraumatic. Eyes: Conjunctivae are normal. Pupils are equal, round, and reactive to light. No scleral icterus. Neck: Normal range of motion. Neck supple. No tracheal deviation or thyromegaly present. Cardiovascular: Normal rate, regular rhythm, normal heart sounds and intact distal pulses. Exam reveals no gallop and no friction rub. No murmur heard. Breast: left breast slightly larger than right. Ptotic. No palpable masses. No  nipple retraction or discharge. No contour abnormality. No LAD. Right breast benign. Respiratory: Effort normal. No respiratory distress. No chest wall tenderness. Breath sounds normal. No wheezes, rales or rhonchi. GI: Soft. Bowel sounds are normal. The abdomen is soft and nontender. There is no rebound and no guarding. Musculoskeletal: Normal range of motion. Extremities are nontender. Lymphadenopathy: No cervical, preauricular, postauricular or axillary adenopathy is present Skin: Skin is warm and dry. No rash noted. No diaphoresis. No erythema. No pallor. No clubbing, cyanosis, or edema. Psychiatric: Normal mood and affect. Behavior is normal. Judgment and thought content normal.  Labs N/a  Assessment and Plan:  ICD-10-CM 1. Malignant neoplasm of upper-outer quadrant of left female breast, unspecified estrogen receptor status (CMS-HCC) C50.412 Ambulatory Referral to Oncology-Medical Ambulatory Referral to Radiation Oncology Ambulatory Referral to Physical Therapy   Patient has a new diagnosis of left breast cancer, clinical T1c N0. I do not yet have her prognostic panel. I discussed that if this is hormone negative or HER2 positive that she would definitely be recommended to receive chemotherapy.  I discussed that in general treatment would consist of lumpectomy with sentinel lymph node biopsy, radiation, and additional treatment to be determined by Dr. Earley Favor.  The surgical procedure was described to the patient. I discussed the incision type and location and that we will need radiology involved with a seed marker. I discussed block placement.  We discussed the risks bleeding, infection, damage to other structures, need for further procedures/surgeries. We discussed the risk of seroma. The patient was advised if the breast has cancer, we may need to go back to surgery for additional tissue to obtain negative margins or for a lymph node biopsy. The patient was advised that these are the  most common complications, but that others can occur as well. I discussed the risk of alteration in breast contour or size. I discussed risk of chronic pain. There are rare instances of heart/lung issues post op  as well as blood clots.  They were advised against taking aspirin or other anti-inflammatory agents/blood thinners the week before surgery.  The risks and benefits of the procedure were described to the patient and she wishes to proceed.  I have placed referrals for medical and radiation oncology. I have also placed a referral to physical therapy and given a prescription for second to nature.

## 2022-07-08 NOTE — Op Note (Signed)
Left Breast Radioactive seed localized lumpectomy and sentinel lymph node biopsy  Indications: This patient presents with history of left breast cancer, cT1cN0 grade 2 invasive ductal carcinoma, upper outer quadrant, ER+/PR-/Her2-  Pre-operative Diagnosis: left breast cancer  Post-operative Diagnosis: Same  Surgeon: Stark Klein   Assistant: n/a  Anesthesia: General endotracheal anesthesia  ASA Class: 3  Procedure Details  The patient was seen in the Holding Room. The risks, benefits, complications, treatment options, and expected outcomes were discussed with the patient. The possibilities of bleeding, infection, the need for additional procedures, failure to diagnose a condition, and creating a complication requiring transfusion or operation were discussed with the patient. The patient concurred with the proposed plan, giving informed consent.  The site of surgery properly noted/marked. The patient was taken to Operating Room # 2, identified, and the procedure verified as Left Breast Seed localized Lumpectomy with sentinel lymph node biopsy. The left arm, breast, and chest were prepped and draped in standard fashion. A Time Out was held and the above information confirmed. The MagTrace was injected into the subareolar position.  The lumpectomy was performed by creating an axillary incision near the previously placed radioactive seed.  Dissection was carried down to around the point of maximum signal intensity. The cautery was used to perform the dissection.  Hemostasis was achieved with cautery. The edges of the cavity were marked with large clips.   The specimen was inked with the margin marker paint kit.    Specimen radiography confirmed inclusion of the mammographic lesion, the clip, and the seed.  The background signal in the breast was zero.  Additional margins were taken at the inferior and medial margin(s). The wound was irrigated and reinspected for hemostasis.     Using a Sentimag  probe, left axillary sentinel nodes were identified.  The same incision was used from the lumpectomy.  Dissection was carried through the clavipectoral fascia.  Three deep level two axillary sentinel nodes were removed.  Counts per second were 530, 2200, and 0 (palpable).    The background count was 0 cps.  The wound was irrigated.  Hemostasis was achieved with cautery.  The axillary incision was closed with a 3-0 vicryl deep dermal interrupted sutures and a 4-0 monocryl subcuticular closure.    Sterile dressings were applied. At the end of the operation, all sponge, instrument, and needle counts were correct.  Findings: grossly clear surgical margins and no adenopathy, anterior and lateral margins are skin, posterior margin is pectoralis.   Estimated Blood Loss:  min         Specimens: left breast tissue with seed, additional medial margin, additional inferior margin, and three deep left axillary sentinel lymph nodes.             Complications:  None; patient tolerated the procedure well.         Disposition: PACU - hemodynamically stable.         Condition: stable

## 2022-07-08 NOTE — Interval H&P Note (Signed)
History and Physical Interval Note:  07/08/2022 9:28 AM  Holly Leach A Boghosian  has presented today for surgery, with the diagnosis of LEFT BREAST CANCER.  The various methods of treatment have been discussed with the patient and family. After consideration of risks, benefits and other options for treatment, the patient has consented to  Procedure(s): LEFT BREAST LUMPECTOMY WITH RADIOACTIVE SEED AND SENTINEL LYMPH NODE BIOPSY (Left) as a surgical intervention.  The patient's history has been reviewed, patient examined, no change in status, stable for surgery.  I have reviewed the patient's chart and labs.  Questions were answered to the patient's satisfaction.     Stark Klein

## 2022-07-08 NOTE — Anesthesia Procedure Notes (Signed)
Procedure Name: LMA Insertion Date/Time: 07/08/2022 11:21 AM  Performed by: Valda Favia, CRNAPre-anesthesia Checklist: Patient identified, Emergency Drugs available, Suction available and Patient being monitored Patient Re-evaluated:Patient Re-evaluated prior to induction Oxygen Delivery Method: Circle System Utilized Preoxygenation: Pre-oxygenation with 100% oxygen Induction Type: IV induction LMA: LMA with gastric port inserted LMA Size: 4.0 Number of attempts: 1 Airway Equipment and Method: Bite block Placement Confirmation: positive ETCO2 Tube secured with: Tape Dental Injury: Teeth and Oropharynx as per pre-operative assessment

## 2022-07-09 ENCOUNTER — Encounter (HOSPITAL_COMMUNITY): Payer: Self-pay | Admitting: General Surgery

## 2022-07-10 LAB — SURGICAL PATHOLOGY

## 2022-07-13 ENCOUNTER — Encounter: Payer: Self-pay | Admitting: *Deleted

## 2022-07-13 NOTE — Progress Notes (Signed)
Per Dr Marin Olp, request for Oncotype DX sent on specimen [id number] DOS 07/08/2022.  Oncology Nurse Navigator Documentation     07/13/2022   12:00 PM  Oncology Nurse Navigator Flowsheets  Navigator Follow Up Date: 08/04/2022  Navigator Follow Up Reason: Follow-up Appointment  Navigator Location [facility]  Navigator Encounter Type Pathology Review  Patient Visit Type MedOnc  Treatment Phase Active Tx  Barriers/Navigation Needs Coordination of Care;Education  Interventions Coordination of Care  Acuity Level 2-Minimal Needs (1-2 Barriers Identified)  Coordination of Care Pathology  Support Groups/Services Friends and Family  Time Spent with Patient 30

## 2022-07-21 ENCOUNTER — Encounter (HOSPITAL_COMMUNITY): Payer: Self-pay

## 2022-07-23 ENCOUNTER — Encounter (HOSPITAL_COMMUNITY): Payer: Self-pay

## 2022-07-23 ENCOUNTER — Other Ambulatory Visit: Payer: Self-pay | Admitting: General Surgery

## 2022-07-23 ENCOUNTER — Encounter: Payer: Self-pay | Admitting: *Deleted

## 2022-07-23 ENCOUNTER — Inpatient Hospital Stay: Payer: BC Managed Care – PPO

## 2022-07-23 DIAGNOSIS — Z170 Estrogen receptor positive status [ER+]: Secondary | ICD-10-CM

## 2022-07-23 NOTE — Progress Notes (Signed)
Oncotype Recurrence Score returned at 53. Called the patient and spoke to her about results. She had several questions about chemo and timing. She returns to work on 08/05/2022 and would like to complete as much as she can prior to that date. Also spoke to her about port placement, and she requests that Dr Barry Dienes place this.   Spoke to Dr Marin Olp. We will move up her follow up appointment and schedule chemo education.   Spent some time on the phone with patient answering her questions about ports, chemo, work, and side effects.   Oncology Nurse Navigator Documentation     07/23/2022    8:45 AM  Oncology Nurse Navigator Flowsheets  Navigator Follow Up Date: 07/27/2022  Navigator Follow Up Reason: Follow-up Appointment  Navigator Location [facility]  Navigator Encounter Type Pathology Review;Telephone  Telephone Diagnostic Results;Appt Confirmation/Clarification;Education;Outgoing Call  Patient Visit Type MedOnc  Treatment Phase Active Tx  Barriers/Navigation Needs Coordination of Care;Education  Education Newly Diagnosed Cancer Education;Pain/ Symptom Management;Other  Interventions Coordination of Care;Education;Psycho-Social Support;Referrals  Acuity Level 2-Minimal Needs (1-2 Barriers Identified)  Referrals Nutrition/dietician;Social Work  Coordination of Care Appts  Education Method Verbal  Support Groups/Services Friends and Family  Time Spent with Patient 67

## 2022-07-23 NOTE — Progress Notes (Signed)
[place] Work  Initial Assessment   Holly Leach is a 52 y.o. year old female contacted by phone. Clinical Social Work was referred by nurse navigator for assessment of psychosocial needs.   SDOH (Social Determinants of Health) assessments performed: Yes SDOH Interventions    Flowsheet Row Clinical Support from 07/23/2022 in [place] at [facility]  SDOH Interventions   Financial Strain Interventions Other (Comment)  Social Connections Interventions Intervention Not Indicated       SDOH Screenings   Food Insecurity: No Food Insecurity (06/30/2022)  Housing: Low Risk  (06/30/2022)  Transportation Needs: No Transportation Needs (06/30/2022)  Utilities: Not At Risk (06/30/2022)  Depression (PHQ2-9): Low Risk  (06/30/2022)  Financial Resource Strain: Medium Risk (07/23/2022)  Social Connections: Socially Integrated (07/23/2022)  Tobacco Use: Low Risk  (07/09/2022)     Distress Screen completed: No    06/26/2015    1:06 PM  ONCBCN DISTRESS SCREENING  Screening Type Initial Screening  Distress experienced in past week (1-10) 1      Family/Social Information:  Housing Arrangement: patient lives with her husband, Hassell Done. Family members/support persons in your life? Family, Friends, and [employer].  Transportation concerns: no  Employment: She is employed full-time by [employer].  She has used her PAL for her surgery and will be starting short term disability through her employer. Income source: Short-Term Disability Financial concerns: Yes, due to illness and/or loss of work during treatment Type of concern: Medical bills Food access concerns: no Religious or spiritual practice: Yes-Patient is very active in her church. Services Currently in place:  [employer]  Coping/ Adjustment to diagnosis: Patient understands treatment plan and what happens next? yes Concerns about diagnosis and/or treatment: Losing my job and/or losing income Patient reported  stressors: Finances and Adjusting to my illness Hopes and/or priorities: Hope is to continue living as independently as possible. Patient enjoys reading, watching TV, and time with family/ friends Current coping skills/ strengths: Active sense of humor , Average or above average intelligence , Capable of independent living , Communication skills , General fund of knowledge , Motivation for treatment/growth , Religious Affiliation , Supportive family/friends , and Work skills     SUMMARY: Current SDOH Barriers:  Financial constraints related to loss of work during treatment.  Clinical Social Work Clinical Goal(s):  Explore community resource options for unmet needs related to:  Financial Strain   Interventions: Discussed common feeling and emotions when being diagnosed with cancer, and the importance of support during treatment Informed patient of the support team roles and support services at [facility] Provided [place] contact information and encouraged patient to call with any questions or concerns Provided patient with information about the [employer] and [employer] and [employer].  CSW to mail patient program information and breast cancer grant application.   Follow Up Plan: Patient will contact CSW with any support or resource needs Patient verbalizes understanding of plan: Yes    Rodman Pickle Iliya Spivack, LCSW

## 2022-07-24 ENCOUNTER — Other Ambulatory Visit: Payer: Self-pay | Admitting: *Deleted

## 2022-07-27 ENCOUNTER — Encounter: Payer: Self-pay | Admitting: Hematology & Oncology

## 2022-07-27 ENCOUNTER — Inpatient Hospital Stay (HOSPITAL_BASED_OUTPATIENT_CLINIC_OR_DEPARTMENT_OTHER): Payer: BC Managed Care – PPO | Admitting: Hematology & Oncology

## 2022-07-27 ENCOUNTER — Encounter: Payer: Self-pay | Admitting: *Deleted

## 2022-07-27 ENCOUNTER — Encounter (HOSPITAL_COMMUNITY): Payer: Self-pay | Admitting: General Surgery

## 2022-07-27 ENCOUNTER — Other Ambulatory Visit: Payer: Self-pay | Admitting: *Deleted

## 2022-07-27 ENCOUNTER — Inpatient Hospital Stay: Payer: BC Managed Care – PPO

## 2022-07-27 ENCOUNTER — Other Ambulatory Visit: Payer: Self-pay

## 2022-07-27 VITALS — BP 127/82 | HR 80 | Temp 98.9°F | Resp 20 | Wt 220.1 lb

## 2022-07-27 DIAGNOSIS — Z170 Estrogen receptor positive status [ER+]: Secondary | ICD-10-CM | POA: Diagnosis not present

## 2022-07-27 DIAGNOSIS — C50412 Malignant neoplasm of upper-outer quadrant of left female breast: Secondary | ICD-10-CM | POA: Diagnosis not present

## 2022-07-27 DIAGNOSIS — C50912 Malignant neoplasm of unspecified site of left female breast: Secondary | ICD-10-CM

## 2022-07-27 LAB — CMP (CANCER CENTER ONLY)
ALT: 63 U/L — ABNORMAL HIGH (ref 0–44)
AST: 50 U/L — ABNORMAL HIGH (ref 15–41)
Albumin: 4.6 g/dL (ref 3.5–5.0)
Alkaline Phosphatase: 118 U/L (ref 38–126)
Anion gap: 11 (ref 5–15)
BUN: 34 mg/dL — ABNORMAL HIGH (ref 6–20)
CO2: 25 mmol/L (ref 22–32)
Calcium: 10.4 mg/dL — ABNORMAL HIGH (ref 8.9–10.3)
Chloride: 102 mmol/L (ref 98–111)
Creatinine: 1.79 mg/dL — ABNORMAL HIGH (ref 0.44–1.00)
GFR, Estimated: 34 mL/min — ABNORMAL LOW (ref >60)
Glucose, Bld: 110 mg/dL — ABNORMAL HIGH (ref 70–99)
Potassium: 4.6 mmol/L (ref 3.5–5.1)
Sodium: 138 mmol/L (ref 135–145)
Total Bilirubin: 0.3 mg/dL (ref 0.3–1.2)
Total Protein: 7.7 g/dL (ref 6.5–8.1)

## 2022-07-27 LAB — CBC WITH DIFFERENTIAL (CANCER CENTER ONLY)
Abs Immature Granulocytes: 0.03 10*3/uL (ref 0.00–0.07)
Basophils Absolute: 0.1 10*3/uL (ref 0.0–0.1)
Basophils Relative: 1 %
Eosinophils Absolute: 0.2 10*3/uL (ref 0.0–0.5)
Eosinophils Relative: 2 %
HCT: 36.4 % (ref 36.0–46.0)
Hemoglobin: 11.9 g/dL — ABNORMAL LOW (ref 12.0–15.0)
Immature Granulocytes: 1 %
Lymphocytes Relative: 24 %
Lymphs Abs: 1.6 10*3/uL (ref 0.7–4.0)
MCH: 28.1 pg (ref 26.0–34.0)
MCHC: 32.7 g/dL (ref 30.0–36.0)
MCV: 85.8 fL (ref 80.0–100.0)
Monocytes Absolute: 0.8 10*3/uL (ref 0.1–1.0)
Monocytes Relative: 12 %
Neutro Abs: 4 10*3/uL (ref 1.7–7.7)
Neutrophils Relative %: 60 %
Platelet Count: 261 10*3/uL (ref 150–400)
RBC: 4.24 MIL/uL (ref 3.87–5.11)
RDW: 15.4 % (ref 11.5–15.5)
WBC Count: 6.6 10*3/uL (ref 4.0–10.5)
nRBC: 0 % (ref 0.0–0.2)

## 2022-07-27 LAB — LACTATE DEHYDROGENASE: LDH: 153 U/L (ref 98–192)

## 2022-07-27 MED ORDER — LIDOCAINE-PRILOCAINE 2.5-2.5 % EX CREA: TOPICAL_CREAM | CUTANEOUS | 2 refills | Status: DC

## 2022-07-27 NOTE — Progress Notes (Signed)
START ON PATHWAY REGIMEN - Breast     A cycle is every 21 days:     Docetaxel      Cyclophosphamide   **Always confirm dose/schedule in your pharmacy ordering system**  Patient Characteristics: Postoperative without Neoadjuvant Therapy (Pathologic Staging), Invasive Disease, Adjuvant Therapy, HER2 Negative, ER Positive, Node Negative, pT1a, pN53m or pT1b-c, pN0/N133mor pT2 or Higher, pN0, Oncotype High Risk (? 26) Therapeutic Status: Postoperative without Neoadjuvant Therapy (Pathologic Staging) AJCC Grade: G3 AJCC N Category: pN0 AJCC M Category: cM0 ER Status: Positive (+) AJCC 8 Stage Grouping: IIA HER2 Status: Negative (-) Oncotype Dx Recurrence Score: Not Appropriate AJCC T Category: pT2 PR Status: Negative (-) Has this patient completed genomic testing<= Yes - Oncotype DX(R) Intent of Therapy: Curative Intent, Discussed with Patient

## 2022-07-27 NOTE — Progress Notes (Signed)
Hematology and Oncology Follow Up Visit  Holly Leach [id number] [birth year] 52 y.o. 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  She did have her lumpectomy.  This was done on 07/08/2022.  The pathology report [facility] - [phone]) showed a 2.2 cm grade 3 ductal carcinoma of the left breast.  All margins were negative.  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  She had 3 sentinel lymph nodes that were negative.  Unfortunately, her Oncotype score was incredibly high at 53.  I think this goes along with the fact that this is a high-grade tumor with a very high proliferation index of 85%.  She is stage IIA by criteria.  I think she clearly needs adjuvant chemotherapy.  She is going to have a Port-A-Cath placed tomorrow.  Her main complaint is been there is localized pain in the upper outer quadrant of the left breast.  This is focal.  This measures about 1 cm.  It is quite tender.  There is no erythema or swelling.  I really do not detect any ecchymoses.  There is no dysesthesia of the left arm.  There is no swelling in the left arm.  She feels well otherwise.  She has had no nausea or vomiting.  There is no cough or shortness of breath.  She has had no fever.  There is been no obvious bleeding.  I would have to say that her performance status is probably ECOG 1.  Medications:  Current Outpatient Medications:    ALPRAZolam (XANAX) 0.25 MG tablet, Take 0.25 mg by mouth at bedtime., Disp: , Rfl:    Bacillus Coagulans-Inulin (PROBIOTIC-PREBIOTIC PO), Take 2.9 g by mouth daily., Disp: , Rfl:    carvedilol (COREG) 3.125 MG tablet, Take 3.125 mg by mouth 2 (two) times daily., Disp: , Rfl:    COLLAGEN PO, Take 1 tablet by mouth daily. New skin collagen,  Disp: , Rfl:    Continuous Blood Gluc Sensor (FREESTYLE LIBRE 3 SENSOR) MISC, , Disp: , Rfl:    famotidine (PEPCID) 40 MG tablet, Take 40 mg by mouth daily., Disp: , Rfl:    Levothyroxine Sodium 25 MCG CAPS, Take 25 mcg by mouth daily before breakfast., Disp: , Rfl:    lisinopril-hydrochlorothiazide (ZESTORETIC) 20-25 MG tablet, Take 1 tablet by mouth 2 (two) times daily., Disp: , Rfl:    loratadine (CLARITIN) 10 MG tablet, Take 10 mg by mouth daily., Disp: , Rfl:    Melatonin 10 MG TABS, Take 10 mg by mouth at bedtime., Disp: , Rfl:    montelukast (SINGULAIR) 10 MG tablet, Take 10 mg by mouth daily., Disp: , Rfl:    TRULICITY [id number] SOPN, Inject 0.75 mg into the skin once a week. thursday, Disp: , Rfl:    Vitamin D, Ergocalciferol, (DRISDOL) 1.25 MG (50000 UNIT) CAPS capsule, Take 50,000 Units by mouth once a week., Disp: , Rfl:    fluticasone (FLONASE) 50 MCG/ACT nasal spray, Place 2 sprays into both nostrils daily as needed for allergies. (Patient not taking: Reported on 07/27/2022), Disp: , Rfl:    lidocaine-prilocaine (EMLA) cream, Apply a dime size to port-a-cath 1-2 hours prior to access. Cover with Thornell Mule., Disp: 30 g, Rfl: 2   meloxicam (MOBIC) 15 MG tablet, Take 15 mg by mouth  daily as needed for pain. (Patient not taking: Reported on 07/27/2022), Disp: , Rfl:    oxyCODONE (OXY IR/ROXICODONE) 5 MG immediate release tablet, Take 1 tablet (5 mg total) by mouth every 6 (six) hours as needed for severe pain. (Patient not taking: Reported on 07/27/2022), Disp: 5 tablet, Rfl: 0  Allergies:  Allergies  Allergen Reactions   Augmentin [Amoxicillin-Pot Clavulanate] Diarrhea    Yeast Infection    Past Medical History, Surgical history, Social history, and Family History were reviewed and updated.  Review of Systems: Review of Systems  Constitutional: Negative.   HENT:  Negative.    Eyes: Negative.   Respiratory: Negative.    Cardiovascular: Negative.   Gastrointestinal:  Negative.   Endocrine: Negative.   Genitourinary: Negative.    Musculoskeletal: Negative.   Skin: Negative.   Neurological: Negative.   Hematological: Negative.   Psychiatric/Behavioral: Negative.      Physical Exam:  weight is 220 lb 1.9 oz (99.8 kg). Her oral temperature is 98.9 F (37.2 C). Her blood pressure is 127/82 and her pulse is 80. Her respiration is 20 and oxygen saturation is 100%.   Wt Readings from Last 3 Encounters:  07/27/22 220 lb 1.9 oz (99.8 kg)  07/08/22 230 lb (104.3 kg)  07/01/22 225 lb 14.4 oz (102.5 kg)    Physical Exam Vitals reviewed.  Constitutional:      Comments: Breast exam shows right breast no masses, edema or erythema.  There is no right axillary adenopathy.  Left breast shows the lumpectomy at about the 2 o'clock position.  She has a point of tenderness just inferior to the lumpectomy site.  There is an area of firmness that is quite tender.  There is no erythema or swelling.  There is no nipple discharge on the left side.  She has no left axillary adenopathy.  HENT:     Head: Normocephalic and atraumatic.  Eyes:     Pupils: Pupils are equal, round, and reactive to light.  Cardiovascular:     Rate and Rhythm: Normal rate and regular rhythm.     Heart sounds: Normal heart sounds.  Pulmonary:     Effort: Pulmonary effort is normal.     Breath sounds: Normal breath sounds.  Abdominal:     General: Bowel sounds are normal.     Palpations: Abdomen is soft.  Musculoskeletal:        General: No tenderness or deformity. Normal range of motion.     Cervical back: Normal range of motion.  Lymphadenopathy:     Cervical: No cervical adenopathy.  Skin:    General: Skin is warm and dry.     Findings: No erythema or rash.  Neurological:     Mental Status: She is alert and oriented to person, place, and time.  Psychiatric:        Behavior: Behavior normal.        Thought Content: Thought content normal.        Judgment: Judgment normal.     Lab  Results  Component Value Date   WBC 6.6 07/27/2022   HGB 11.9 (L) 07/27/2022   HCT 36.4 07/27/2022   MCV 85.8 07/27/2022   PLT 261 07/27/2022     Chemistry      Component Value Date/Time   NA 138 07/27/2022 1338   K 4.6 07/27/2022 1338   CL 102 07/27/2022 1338   CO2 25 07/27/2022 1338   BUN 34 (H) 07/27/2022 1338   CREATININE 1.79 (H) 07/27/2022  1338      Component Value Date/Time   CALCIUM 10.4 (H) 07/27/2022 1338   ALKPHOS 118 07/27/2022 1338   AST 50 (H) 07/27/2022 1338   ALT 63 (H) 07/27/2022 1338   BILITOT 0.3 07/27/2022 1338       Impression and Plan: Ms. Helling is a very nice 52 year old perimenopausal white female.  She has a fairly high-grade stage IIa ductal carcinoma of the left breast.  This has a very high Oncotype score.  Has a very high proliferation index.  Again, I suspect that she is a good candidate for adjuvant chemotherapy.  I think she would do well with 4 cycles of Taxotere/Cytoxan.  She is scheduled to have a Port-A-Cath placed tomorrow.  I am  not sure what she has is point tenderness.  However, she can see Dr. Barry Dienes who will do the Port-A-Cath for [place].  She had education teaching today.  She got information about the Taxotere and the Cytoxan.  She knows that she will lose her hair.  She will need to have Neulasta to help with white cell maintenance.  Again I think 4 cycles would be appropriate for her.  She will then need adjuvant radiation therapy.  After that, I would then consider her for adjuvant antiestrogen therapy.  I do not think we have to put her on a CDK4/CDK6 inhibitor.  Again we will start treatment on 08/06/2022.  I will plan to see her back when she starts her second cycle of treatment.   Volanda Napoleon, MD 2/26/20245:38 PM

## 2022-07-27 NOTE — Progress Notes (Signed)
Patient in chemotherapy education class with spouse. Discussed side effects of Taxotere and Cytoxan which include but are not limited to myelosuppression, decreased appetite, fatigue,fever, allergic or infusional reaction, mucositis, cardiac toxicity, cough, SOB, altered taste, nausea and vomiting, diarrhea, constipation, elevated LFT's, myalgia and arthralgias, hair loss or thinning, rash, skin dryness, nail changes, peripheral neuropathy, discolored urine, delayed wound healing, mental changes (chemo brain), increased risk of infections, weight loss. Reviewed infusion room and office policy and procedure and phone numbers 24 hours x 7 days a week. Reviewed when to call the office with any concerns or problems. She is active on MyChart. Scientist, clinical (histocompatibility and immunogenetics) given. Discussed port-a-cath insertion (tomorrow 07/28/22) and EMLA cream administration. EMLA cream prescription E-scribed today. Antiemetic protocol and chemotherapy schedule reviewed. We are waiting for the treatment plan to be entered. There is not an official start date. Patient verbalized understanding of chemotherapy indications and possible side effects. All questions answered at this time. Teach back done. Instructed her that Sheria Lang will call her once the treatment plan is in and what antiemetics she will get. The only request she had is that she would like to start treatment on a Thursday.  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 Then she can rest on the weekend. I told her that should not be a problem.

## 2022-07-27 NOTE — Progress Notes (Addendum)
Holly Leach denies chest pain or shortness of breath. Patient denies having any s/s of Covid in her household, also denies any known exposure to Covid.   Holly Leach's PCP is Dr. Quitman Livings.  Holly Leach saw Dr. Marin Olp today, CMP and CBC with diff were drawn, Creatine level increased to 1.76.  Dr Marin Olp instructed patient to drink lots of water. I informed Dr. Marin Comment of lab report, he instructed to have lab repeated in am.

## 2022-07-28 ENCOUNTER — Ambulatory Visit (HOSPITAL_COMMUNITY): Payer: BC Managed Care – PPO

## 2022-07-28 ENCOUNTER — Other Ambulatory Visit: Payer: Self-pay

## 2022-07-28 ENCOUNTER — Ambulatory Visit (HOSPITAL_COMMUNITY): Payer: BC Managed Care – PPO | Admitting: Anesthesiology

## 2022-07-28 ENCOUNTER — Encounter (HOSPITAL_COMMUNITY): Payer: Self-pay | Admitting: General Surgery

## 2022-07-28 ENCOUNTER — Ambulatory Visit (HOSPITAL_COMMUNITY)
Admission: RE | Admit: 2022-07-28 | Discharge: 2022-07-28 | Disposition: A | Discharge status: Home / Self Care | Payer: BC Managed Care – PPO | Attending: General Surgery | Admitting: General Surgery

## 2022-07-28 ENCOUNTER — Encounter (HOSPITAL_COMMUNITY)
Admission: RE | Disposition: A | Discharge status: Home / Self Care | Payer: Self-pay | Source: Home / Self Care | Attending: General Surgery

## 2022-07-28 DIAGNOSIS — Z01818 Encounter for other preprocedural examination: Secondary | ICD-10-CM

## 2022-07-28 DIAGNOSIS — C50412 Malignant neoplasm of upper-outer quadrant of left female breast: Secondary | ICD-10-CM | POA: Diagnosis present

## 2022-07-28 DIAGNOSIS — Z170 Estrogen receptor positive status [ER+]: Secondary | ICD-10-CM | POA: Insufficient documentation

## 2022-07-28 DIAGNOSIS — R899 Unspecified abnormal finding in specimens from other organs, systems and tissues: Secondary | ICD-10-CM

## 2022-07-28 HISTORY — PX: FINE NEEDLE ASPIRATION: SHX6590

## 2022-07-28 HISTORY — DX: Malignant (primary) neoplasm, unspecified: C80.1

## 2022-07-28 HISTORY — PX: PORTACATH PLACEMENT: SHX2246

## 2022-07-28 LAB — GLUCOSE, CAPILLARY
Glucose-Capillary: 104 mg/dL — ABNORMAL HIGH (ref 70–99)
Glucose-Capillary: 101 mg/dL — ABNORMAL HIGH (ref 70–99)

## 2022-07-28 LAB — CREATININE, SERUM
Creatinine, Ser: 1.24 mg/dL — ABNORMAL HIGH (ref 0.44–1.00)
GFR, Estimated: 53 mL/min — ABNORMAL LOW (ref >60)

## 2022-07-28 SURGERY — INSERTION, TUNNELED CENTRAL VENOUS DEVICE, WITH PORT
Anesthesia: General | Site: Chest | Laterality: Left

## 2022-07-28 MED ORDER — CYCLOBENZAPRINE HCL 5 MG PO TABS: 5.0000 mg | ORAL_TABLET | Freq: Three times a day (TID) | ORAL | 1 refills | Status: DC | PRN

## 2022-07-28 MED ORDER — LIDOCAINE HCL 1 % IJ SOLN
INTRAMUSCULAR | Status: AC
  Filled 2022-07-28: qty 20

## 2022-07-28 MED ORDER — MIDAZOLAM HCL 2 MG/2ML IJ SOLN
INTRAMUSCULAR | Status: DC | PRN
  Administered 2022-07-28: 2 mg via INTRAVENOUS

## 2022-07-28 MED ORDER — OXYCODONE HCL 5 MG PO TABS: 5.0000 mg | ORAL_TABLET | Freq: Once | ORAL | Status: AC | PRN

## 2022-07-28 MED ORDER — OXYCODONE HCL 5 MG/5ML PO SOLN
ORAL | Status: AC
  Filled 2022-07-28: qty 5

## 2022-07-28 MED ORDER — DEXAMETHASONE SODIUM PHOSPHATE 10 MG/ML IJ SOLN
INTRAMUSCULAR | Status: DC | PRN
  Administered 2022-07-28: 10 mg via INTRAVENOUS

## 2022-07-28 MED ORDER — ACETAMINOPHEN 500 MG PO TABS
1000.0000 mg | ORAL_TABLET | ORAL | Status: AC
  Administered 2022-07-28: 1000 mg via ORAL
  Filled 2022-07-28: qty 2

## 2022-07-28 MED ORDER — LIDOCAINE HCL 1 % IJ SOLN
INTRAMUSCULAR | Status: DC | PRN
  Administered 2022-07-28: 16 mL via INTRAMUSCULAR

## 2022-07-28 MED ORDER — ONDANSETRON HCL 4 MG/2ML IJ SOLN: 4.0000 mg | Freq: Four times a day (QID) | INTRAMUSCULAR | Status: DC | PRN

## 2022-07-28 MED ORDER — HEPARIN SOD (PORK) LOCK FLUSH 100 UNIT/ML IV SOLN
INTRAVENOUS | Status: AC
  Filled 2022-07-28: qty 5

## 2022-07-28 MED ORDER — LACTATED RINGERS IV SOLN: INTRAVENOUS | Status: DC

## 2022-07-28 MED ORDER — PHENYLEPHRINE HCL-NACL 20-0.9 MG/250ML-% IV SOLN
INTRAVENOUS | Status: DC | PRN
  Administered 2022-07-28: 25 ug/min via INTRAVENOUS

## 2022-07-28 MED ORDER — PROPOFOL 10 MG/ML IV BOLUS
INTRAVENOUS | Status: DC | PRN
  Administered 2022-07-28: 150 mg via INTRAVENOUS

## 2022-07-28 MED ORDER — HEPARIN 6000 UNIT IRRIGATION SOLUTION
Status: DC | PRN
  Administered 2022-07-28: 1

## 2022-07-28 MED ORDER — FENTANYL CITRATE (PF) 250 MCG/5ML IJ SOLN
INTRAMUSCULAR | Status: DC | PRN
  Administered 2022-07-28: 50 ug via INTRAVENOUS

## 2022-07-28 MED ORDER — DEXAMETHASONE SODIUM PHOSPHATE 10 MG/ML IJ SOLN
INTRAMUSCULAR | Status: AC
  Filled 2022-07-28: qty 1

## 2022-07-28 MED ORDER — OXYCODONE HCL 5 MG/5ML PO SOLN
5.0000 mg | Freq: Once | ORAL | Status: AC | PRN
  Administered 2022-07-28: 5 mg via ORAL

## 2022-07-28 MED ORDER — ORAL CARE MOUTH RINSE: 15.0000 mL | Freq: Once | OROMUCOSAL | Status: AC

## 2022-07-28 MED ORDER — LIDOCAINE 2% (20 MG/ML) 5 ML SYRINGE
INTRAMUSCULAR | Status: DC | PRN
  Administered 2022-07-28: 60 mg via INTRAVENOUS

## 2022-07-28 MED ORDER — CHLORHEXIDINE GLUCONATE CLOTH 2 % EX PADS: 6.0000 | MEDICATED_PAD | Freq: Once | CUTANEOUS | Status: DC

## 2022-07-28 MED ORDER — MIDAZOLAM HCL 2 MG/2ML IJ SOLN
INTRAMUSCULAR | Status: AC
  Filled 2022-07-28: qty 2

## 2022-07-28 MED ORDER — CIPROFLOXACIN IN D5W 400 MG/200ML IV SOLN
400.0000 mg | INTRAVENOUS | Status: DC
  Filled 2022-07-28: qty 200

## 2022-07-28 MED ORDER — PROPOFOL 10 MG/ML IV BOLUS
INTRAVENOUS | Status: AC
  Filled 2022-07-28: qty 20

## 2022-07-28 MED ORDER — ONDANSETRON HCL 4 MG/2ML IJ SOLN
INTRAMUSCULAR | Status: DC | PRN
  Administered 2022-07-28: 4 mg via INTRAVENOUS

## 2022-07-28 MED ORDER — PHENYLEPHRINE 80 MCG/ML (10ML) SYRINGE FOR IV PUSH (FOR BLOOD PRESSURE SUPPORT)
PREFILLED_SYRINGE | INTRAVENOUS | Status: DC | PRN
  Administered 2022-07-28 (×2): 200 ug via INTRAVENOUS

## 2022-07-28 MED ORDER — ENSURE PRE-SURGERY PO LIQD: 296.0000 mL | Freq: Once | ORAL | Status: DC

## 2022-07-28 MED ORDER — HEPARIN 6000 UNIT IRRIGATION SOLUTION
Status: AC
  Filled 2022-07-28: qty 500

## 2022-07-28 MED ORDER — CHLORHEXIDINE GLUCONATE 0.12 % MT SOLN
15.0000 mL | Freq: Once | OROMUCOSAL | Status: AC
  Administered 2022-07-28: 15 mL via OROMUCOSAL
  Filled 2022-07-28: qty 15

## 2022-07-28 MED ORDER — BUPIVACAINE-EPINEPHRINE (PF) 0.25% -1:200000 IJ SOLN
INTRAMUSCULAR | Status: AC
  Filled 2022-07-28: qty 30

## 2022-07-28 MED ORDER — FENTANYL CITRATE (PF) 100 MCG/2ML IJ SOLN: 25.0000 ug | INTRAMUSCULAR | Status: DC | PRN

## 2022-07-28 MED ORDER — ONDANSETRON HCL 4 MG/2ML IJ SOLN
INTRAMUSCULAR | Status: AC
  Filled 2022-07-28: qty 2

## 2022-07-28 MED ORDER — FENTANYL CITRATE (PF) 250 MCG/5ML IJ SOLN
INTRAMUSCULAR | Status: AC
  Filled 2022-07-28: qty 5

## 2022-07-28 MED ORDER — LIDOCAINE 2% (20 MG/ML) 5 ML SYRINGE
INTRAMUSCULAR | Status: AC
  Filled 2022-07-28: qty 5

## 2022-07-28 MED ORDER — HEPARIN SOD (PORK) LOCK FLUSH 100 UNIT/ML IV SOLN
INTRAVENOUS | Status: DC | PRN
  Administered 2022-07-28: 500 [IU]

## 2022-07-28 MED ORDER — 0.9 % SODIUM CHLORIDE (POUR BTL) OPTIME
TOPICAL | Status: DC | PRN
  Administered 2022-07-28: 1000 mL

## 2022-07-28 SURGICAL SUPPLY — 48 items
ADH SKN CLS APL DERMABOND .7 (GAUZE/BANDAGES/DRESSINGS) ×2
APL PRP STRL LF DISP 70% ISPRP (MISCELLANEOUS)
BAG COUNTER SPONGE SURGICOUNT (BAG) ×2 IMPLANT
BAG DECANTER FOR FLEXI CONT (MISCELLANEOUS) ×2 IMPLANT
BAG SPNG CNTER NS LX DISP (BAG) ×2
CANISTER SUCT 3000ML PPV (MISCELLANEOUS) IMPLANT
CHLORAPREP W/TINT 26 (MISCELLANEOUS) ×2 IMPLANT
COVER SURGICAL LIGHT HANDLE (MISCELLANEOUS) ×2 IMPLANT
COVER TRANSDUCER ULTRASND GEL (DISPOSABLE) IMPLANT
DERMABOND ADVANCED .7 DNX12 (GAUZE/BANDAGES/DRESSINGS) ×2 IMPLANT
DRAPE C-ARM 42X120 X-RAY (DRAPES) ×2 IMPLANT
DRAPE CHEST BREAST 15X10 FENES (DRAPES) ×2 IMPLANT
DRAPE WARM FLUID 44X44 (DRAPES) IMPLANT
DURAPREP 26ML APPLICATOR (WOUND CARE) IMPLANT
ELECT COATED BLADE 2.86 ST (ELECTRODE) ×2 IMPLANT
ELECT REM PT RETURN 9FT ADLT (ELECTROSURGICAL) ×2
ELECTRODE REM PT RTRN 9FT ADLT (ELECTROSURGICAL) ×2 IMPLANT
GAUZE 4X4 16PLY ~~LOC~~+RFID DBL (SPONGE) ×2 IMPLANT
GEL ULTRASOUND 20GR AQUASONIC (MISCELLANEOUS) IMPLANT
GLOVE BIO SURGEON STRL SZ 6 (GLOVE) ×2 IMPLANT
GLOVE INDICATOR 6.5 STRL GRN (GLOVE) ×2 IMPLANT
GOWN STRL REUS W/ TWL LRG LVL3 (GOWN DISPOSABLE) ×2 IMPLANT
GOWN STRL REUS W/ TWL XL LVL3 (GOWN DISPOSABLE) ×2 IMPLANT
GOWN STRL REUS W/TWL LRG LVL3 (GOWN DISPOSABLE) ×2
GOWN STRL REUS W/TWL XL LVL3 (GOWN DISPOSABLE) ×2
KIT BASIN OR (CUSTOM PROCEDURE TRAY) ×2 IMPLANT
KIT PORT POWER 8FR ISP CVUE (Port) IMPLANT
KIT TURNOVER KIT B (KITS) ×2 IMPLANT
NDL 18GX1X1/2 (RX/OR ONLY) (NEEDLE) IMPLANT
NDL 22X1.5 STRL (OR ONLY) (MISCELLANEOUS) ×2 IMPLANT
NEEDLE 18GX1X1/2 (RX/OR ONLY) (NEEDLE) ×2 IMPLANT
NEEDLE 22X1.5 STRL (OR ONLY) (MISCELLANEOUS) ×2 IMPLANT
NS IRRIG 1000ML POUR BTL (IV SOLUTION) ×2 IMPLANT
PAD ARMBOARD 7.5X6 YLW CONV (MISCELLANEOUS) ×2 IMPLANT
PENCIL BUTTON HOLSTER BLD 10FT (ELECTRODE) ×2 IMPLANT
POSITIONER HEAD DONUT 9IN (MISCELLANEOUS) ×2 IMPLANT
SPIKE FLUID TRANSFER (MISCELLANEOUS) ×4 IMPLANT
SUT MON AB 4-0 PC3 18 (SUTURE) ×2 IMPLANT
SUT PROLENE 2 0 SH DA (SUTURE) ×4 IMPLANT
SUT VIC AB 3-0 SH 27 (SUTURE) ×2
SUT VIC AB 3-0 SH 27X BRD (SUTURE) ×2 IMPLANT
SYR 50ML LL SCALE MARK (SYRINGE) IMPLANT
SYR 5ML LUER SLIP (SYRINGE) ×2 IMPLANT
TOWEL GREEN STERILE (TOWEL DISPOSABLE) ×2 IMPLANT
TOWEL GREEN STERILE FF (TOWEL DISPOSABLE) ×2 IMPLANT
TRAY LAPAROSCOPIC MC (CUSTOM PROCEDURE TRAY) ×2 IMPLANT
TUBE CONNECTING 12X1/4 (SUCTIONS) IMPLANT
YANKAUER SUCT BULB TIP NO VENT (SUCTIONS) IMPLANT

## 2022-07-28 NOTE — Transfer of Care (Signed)
Immediate Anesthesia Transfer of Care Note  Patient: Holly Leach  Procedure(s) Performed: INSERTION PORT-A-CATH WITH ULTRASOUND GUIDANCE (Left: Chest) ASPIRATION OF LEFT BREAST SEROMA (Left: Breast)  Patient Location: PACU  Anesthesia Type:General  Level of Consciousness: awake, alert , and oriented  Airway & Oxygen Therapy: Patient Spontanous Breathing  Post-op Assessment: Report given to RN, Post -op Vital signs reviewed and stable, and Patient moving all extremities X 4  Post vital signs: Reviewed and stable  Last Vitals:  Vitals Value Taken Time  BP 118/69 07/28/22 1202  Temp 36.4 C 07/28/22 1202  Pulse 75 07/28/22 1207  Resp 14 07/28/22 1207  SpO2 94 % 07/28/22 1207  Vitals shown include unvalidated device data.  Last Pain:  Vitals:   07/28/22 1202  TempSrc:   PainSc: Asleep      Patients Stated Pain Goal: 0 ([id number])  Complications: No notable events documented.

## 2022-07-28 NOTE — H&P (Signed)
   PROVIDER: Georgianne Fick, MD Patient Care Team: Hayden Rasmussen, MD as PCP - General (Family Medicine) Georgianne Fick, MD as Consulting Provider (Surgical Oncology) Janyth Contes, MD (Obstetrics and Gynecology)  MRN: [id number] DOB: Nov 26, 1970 DATE OF ENCOUNTER: 07/17/2022 Initial History: Pt had a new diagnosis of left breast cancer January 2024. She had a screening detected left breast mass. Diagnostic imaging was performed which demonstrated a 1.4 cm mass at 2:00 14 cm from the nipple. No suspicious lymphadenopathy was seen. 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 Prognostic panel is still pending. The patient has not required breast interventions in the past. She does not have any personal history of breast cancer. She had been on oral contraceptives, but she stopped them with this diagnosis. She has been working with her gynecologist because is felt that she is most likely perimenopausal. This is being addressed.  The patient had a ventral umbilical hernia repair with Dr. Redmond Pulling.   Family cancer history: Maternal great aunt had breast cancer, maternal grandfather had kidney cancer  Interval History:  Pt underwent left breast seed localized lumpectomy and sentinel node biopsy 07/08/2022. Margins and nodes were negative. Final stage was pT2N0. She is still having soreness and is a little tight with her arm motion. She isn't taking narcotics anymore.   Pathology 07/08/2022 A. BREAST, LEFT W/SEED, LUMPECTOMY: - Invasive ductal carcinoma, 2.2 cm, grade 3 - Ductal carcinoma in situ, high-grade with focal necrosis - Resection margins are negative for carcinoma; closest is the posterior margin at 0.3 cm - Biopsy site changes - See oncology table  B. SENTINEL LYMPH NODE, LEFT AXILLARY #1, BIOPSY: - Lymph node, negative for carcinoma (0/1)  C. SENTINEL LYMPH NODE, LEFT AXILLARY #2, BIOPSY: - Lymph node, negative for carcinoma  (0/1)  D. SENTINEL LYMPH NODE, LEFT AXILLARY #3, BIOPSY: - Lymph node, negative for carcinoma (0/1)  E. BREAST, LEFT ADDITIONAL INFERIOR MARGIN, EXCISION: - Benign breast parenchyma, negative for carcinoma  F. BREAST, LEFT ADDITIONAL MEDIAL MARGIN, EXCISION: - Mild fibrocystic change, negative for carcinoma  Physical Examination:  Left breast: left axillary incision (only one incision) looks good. No erythema. No seroma appreciated. ROM abduction to around 120 degrees. Some cording.  Assessment and Plan:  Diagnoses and all orders for this visit:  Malignant neoplasm of upper-outer quadrant of left breast in female, estrogen receptor positive   I have messaged Dr. Marin Olp regarding oncotype. High risk for needing chemotherapy given prognostic panel. Will then need XRT and antihormonal tx. Pt is perimenopausal. Has h/o blood clot, so no tamoxifen for her. She already has PT follow up appt as well. Dx mammogram due 06/2023 [facility]   The plan was discussed in detail with the patient today, who expressed understanding. The patient has my contact information, and understands to call me with any additional questions or concerns in the interval. I would be happy to see the patient back sooner if the need arises.

## 2022-07-28 NOTE — Op Note (Signed)
PREOPERATIVE DIAGNOSIS:  left breast cancer     POSTOPERATIVE DIAGNOSIS:  Same     PROCEDURE: left subclavian port placement, Bard ClearVue Power Port, MRI safe, 8-French, aspiration of left breast/axillary seroma     SURGEON:  Stark Klein, MD      ANESTHESIA:  General   FINDINGS:  Good venous return, easy flush, and tip of the catheter and   SVC 25.5 cm.      SPECIMEN:  None.      ESTIMATED BLOOD LOSS:  Minimal.      COMPLICATIONS:  None known.      PROCEDURE:  Pt was identified in the holding area and taken to   the operating room, where patient was placed supine on the operating room   table.  General anesthesia was induced.  Patient's arms were tucked and the upper   chest and neck were prepped and draped in sterile fashion.    Time-out was performed according to the surgical safety check list.  When all was   correct, we continued.  The left axillary seroma was aspirated.  80-90 mL was removed.  The fluid was serosanguinous and clear.   Local anesthetic was administered just below the angle of the clavicle.  The vein was accessed with 2 pass(es) of the needle. There was good venous return, but the wire would not pass easily.  The arm was retracted caudally and a different angle was taken.  At this point the wire passed much better.  No ectopy was seen.  Fluoroscopy was used to confirm that the wire was in the vena cava.      The patient was placed back level and the area for the pocket was anethetized   with local anesthetic.  A 3-cm transverse incision was made with a #15   blade.  Cautery was used to divide the subcutaneous tissues down to the   pectoralis muscle.  An [employer] retractor was used to elevate the skin   while a pocket was created on top of the pectoralis fascia.  The port   was placed into the pocket to confirm that it was of adequate size.  The   catheter was preattached to the port.  The port was then secured to the   pectoralis fascia with four 2-0 Prolene  sutures.  These were clamped and   not tied down yet.    The catheter was tunneled through to the wire exit   site.  The catheter was placed along the wire to determine what length it should be to be in the SVC.  The catheter was cut at 25.5 cm.  The tunneler sheath and dilator were passed over the wire and the dilator and wire were removed.  The catheter was advanced through the tunneler sheath and the tunneler sheath was pulled away.  Care was taken to keep the catheter in the tunneler sheath as this occurred. This was advanced and the tunneler sheath was removed.  There was good venous   return and easy flush of the catheter.  The Prolene sutures were tied   down to the pectoral fascia.  The skin was reapproximated using 3-0   Vicryl interrupted deep dermal sutures.    Fluoroscopy was used to re-confirm good position of the catheter.  The skin   was then closed using 4-0 Monocryl in a subcuticular fashion.  The port was flushed with concentrated heparin flush as well.  The wounds were then cleaned, dried, and dressed with Dermabond.  The patient was awakened from anesthesia and taken to the PACU in stable condition.  Needle, sponge, and instrument counts were correct.               Stark Klein, MD

## 2022-07-28 NOTE — Anesthesia Procedure Notes (Signed)
Procedure Name: LMA Insertion Date/Time: 07/28/2022 10:58 AM  Performed by: Kyung Rudd, CRNAPre-anesthesia Checklist: Patient identified, Emergency Drugs available, Suction available and Patient being monitored Patient Re-evaluated:Patient Re-evaluated prior to induction Oxygen Delivery Method: Circle System Utilized Preoxygenation: Pre-oxygenation with 100% oxygen Induction Type: IV induction Ventilation: Mask ventilation without difficulty LMA: LMA inserted LMA Size: 4.0 Number of attempts: 1 Placement Confirmation: positive ETCO2 Tube secured with: Tape Dental Injury: Teeth and Oropharynx as per pre-operative assessment

## 2022-07-28 NOTE — Interval H&P Note (Signed)
History and Physical Interval Note:  07/28/2022 10:42 AM  Holly Leach  has presented today for surgery, with the diagnosis of BREAST CANCER.  The various methods of treatment have been discussed with the patient and family. After consideration of risks, benefits and other options for treatment, the patient has consented to  Procedure(s): INSERTION PORT-A-CATH WITH ULTRASOUND GUIDANCE (N/A) as a surgical intervention.  The patient's history has been reviewed, patient examined, no change in status, stable for surgery.  I have reviewed the patient's chart and labs.  Questions were answered to the patient's satisfaction.     Stark Klein

## 2022-07-28 NOTE — Progress Notes (Unsigned)
Visited with patient prior to MD appointment. Plan for 4 cycles of Cytoxan/Taxotere followed by radiation and AI. Her port will be placed tomorrow. She will get chemo education today.   Oncology Nurse Navigator Documentation     07/27/2022    2:30 PM  Oncology Nurse Navigator Flowsheets  Navigator Follow Up Date: 08/07/2022  Navigator Follow Up Reason: Chemotherapy  Navigator Location [facility]  Navigator Encounter Type Follow-up Appt  Patient Visit Type MedOnc  Treatment Phase Active Tx  Barriers/Navigation Needs Coordination of Care;Education  Education Newly Diagnosed Cancer Education;Pain/ Symptom Management;Other  Interventions Coordination of Care;Education;Psycho-Social Support  Acuity Level 2-Minimal Needs (1-2 Barriers Identified)  Coordination of Care Other  Education Method Verbal  Support Groups/Services Friends and Family  Time Spent with Patient 10

## 2022-07-28 NOTE — Discharge Instructions (Addendum)
[place] Office Phone Number [phone]   POST OP INSTRUCTIONS  Always review your discharge instruction sheet given to you by the facility where your surgery was performed.  IF YOU HAVE DISABILITY OR FAMILY LEAVE FORMS, YOU MUST BRING THEM TO THE OFFICE FOR PROCESSING.  DO NOT GIVE THEM TO YOUR DOCTOR.  Take 2 tylenol (acetominophen) three times a day for 3 days.  If you still have pain, add ibuprofen with food in between if able to take this (if you have kidney issues or stomach issues, do not take ibuprofen).  If both of those are not enough, add the narcotic pain pill.  If you find you are needing a lot of this overnight after surgery, call the next morning for a refill.   Take your usually prescribed medications unless otherwise directed If you need a refill on your pain medication, please contact your pharmacy.  They will contact our office to request authorization.  Prescriptions will not be filled after 5pm or on week-ends. You should eat very light the first 24 hours after surgery, such as soup, crackers, pudding, etc.  Resume your normal diet the day after surgery It is common to experience some constipation if taking pain medication after surgery.  Increasing fluid intake and taking a stool softener will usually help or prevent this problem from occurring.  A mild laxative (Milk of Magnesia or Miralax) should be taken according to package directions if there are no bowel movements after 48 hours. You may shower in 48 hours.  The surgical glue will flake off in 2-3 weeks.   ACTIVITIES:  No strenuous activity or heavy lifting for 1 week.   You may drive when you no longer are taking prescription pain medication, you can comfortably wear a seatbelt, and you can safely maneuver your car and apply brakes. RETURN TO WORK:  __________to be determined_______________ Dennis Bast should see your doctor in the office for a follow-up appointment approximately three-four weeks after your  surgery.    WHEN TO CALL YOUR DOCTOR: Fever over 101.0 Nausea and/or vomiting. Extreme swelling or bruising. Continued bleeding from incision. Increased pain, redness, or drainage from the incision.  The clinic staff is available to answer your questions during regular business hours.  Please don't hesitate to call and ask to speak to one of the nurses for clinical concerns.  If you have a medical emergency, go to the nearest emergency room or call 911.  A surgeon from [facility] is always on call at the hospital.  For further questions, please visit [email or web address]

## 2022-07-28 NOTE — Anesthesia Preprocedure Evaluation (Signed)
Anesthesia Evaluation  Patient identified by MRN, date of birth, ID band Patient awake    Reviewed: Allergy & Precautions, H&P , NPO status , Patient's Chart, lab work & pertinent test results  Airway Mallampati: II   Neck ROM: full    Dental   Pulmonary sleep apnea    breath sounds clear to auscultation       Cardiovascular hypertension,  Rhythm:regular Rate:Normal     Neuro/Psych  Headaches    GI/Hepatic ,GERD  ,,  Endo/Other  diabetes, Type 2Hypothyroidism  Morbid obesity  Renal/GU      Musculoskeletal   Abdominal   Peds  Hematology   Anesthesia Other Findings   Reproductive/Obstetrics Breast CA                             Anesthesia Physical Anesthesia Plan  ASA: 2  Anesthesia Plan: General   Post-op Pain Management:    Induction: Intravenous  PONV Risk Score and Plan: 3 and Ondansetron, Dexamethasone, Midazolam and Treatment may vary due to age or medical condition  Airway Management Planned: LMA  Additional Equipment:   Intra-op Plan:   Post-operative Plan: Extubation in OR  Informed Consent: I have reviewed the patients History and Physical, chart, labs and discussed the procedure including the risks, benefits and alternatives for the proposed anesthesia with the patient or authorized representative who has indicated his/her understanding and acceptance.     Dental advisory given  Plan Discussed with: CRNA, Anesthesiologist and Surgeon  Anesthesia Plan Comments:        Anesthesia Quick Evaluation

## 2022-07-29 ENCOUNTER — Telehealth: Payer: Self-pay | Admitting: Dietician

## 2022-07-29 ENCOUNTER — Ambulatory Visit: Payer: BC Managed Care – PPO | Admitting: Dietician

## 2022-07-29 ENCOUNTER — Encounter: Payer: Self-pay | Admitting: Hematology & Oncology

## 2022-07-29 ENCOUNTER — Encounter (HOSPITAL_COMMUNITY): Payer: Self-pay | Admitting: General Surgery

## 2022-07-29 ENCOUNTER — Other Ambulatory Visit: Payer: Self-pay

## 2022-07-29 NOTE — Telephone Encounter (Signed)
Patient contacted scheduling to cancel appointment today at time of appointment.  She requested afternoon appointment.  Sent text with contact information to patient to respond with good time to reach.  April Manson, RDN, LDN Registered Dietitian, [place] Part Time Remote (Usual office hours: Tuesday-Thursday) Mobile: [phone] Remote Office: [phone]

## 2022-07-29 NOTE — Progress Notes (Signed)
Nutrition Follow Up:   Called patient in afternoon as requested at mobile telephone#.   52 year old female diagnosed with Breast Cancer and followed by Dr. Marin Olp and Dr. Isidore Moos. Treatment plan is Docetaxel + Cyclophosphamide q3 weeks. Port placed earlier this week for planned start of chemo 08/07/22.  PMHX includes H/A, HLD, HTN, OSA on C-pap, Diabetes, Thyroid disease.  Takes Trulicity and doesn't have an appetite (into 3rd month).  She reports she is not a big meat eater, doesn't like stead and chicken too dry.  Usual orders vegetable plate while out.  Spouse is chef but he's a meat and potato eater.  B 2 eggs 1-2 toast, sausage and potatoes on weekends L usually skips D Ensure Max yesterday  32oz of water, coffee was drinking but it gave her diarrhea, doesn't like milk (will do Almond milk with whey protein added)  Medications: reviewed,    Labs: Glucose 100-110, 07/28/22 Creatinine 1.24, GFR 53   Height: 5'3" Weight: 07/28/22  220# 07/08/22  230# 07/01/22  225 #  UBW: 210 # in 2023. BMI: 38.97   Nutrition Diagnosis: Food and Nutrition related Knowledge Deficit related to breast cancer as evidenced by no prior need for nutrition related information. Improved answered questions.   Intervention:  Addressed concerns with NIS associated with current treatment.   Encouraged low carb ONS with 20-30 grams protein and less than 200 calories. Ensure Max, Magdalene Patricia, Unjury products. Addressed question about snack bars (less than 20 grams carb, 15029 grams protein, check out Adkins products) Emailed Nutrition during cancer treatments and Plant based protein list.  Monitoring,Evaluation, Goals: Patient will follow a healthy balanced plant based diet for weight maintenance/slow, safe weight loss during treatment.     April Manson, RDN, LDN Registered Dietitian, [place] Part Time Remote (Usual office hours: Tuesday-Thursday) Mobile: [phone] Remote Office: [phone]

## 2022-07-30 ENCOUNTER — Ambulatory Visit: Payer: BC Managed Care – PPO | Admitting: Physical Therapy

## 2022-07-30 NOTE — Anesthesia Postprocedure Evaluation (Signed)
Anesthesia Post Note  Patient: Holly Leach  Procedure(s) Performed: INSERTION PORT-A-CATH WITH ULTRASOUND GUIDANCE (Left: Chest) ASPIRATION OF LEFT BREAST SEROMA (Left: Breast)     Patient location during evaluation: PACU Anesthesia Type: General Level of consciousness: awake and alert Pain management: pain level controlled Vital Signs Assessment: post-procedure vital signs reviewed and stable Respiratory status: spontaneous breathing, nonlabored ventilation, respiratory function stable and patient connected to nasal cannula oxygen Cardiovascular status: blood pressure returned to baseline and stable Postop Assessment: no apparent nausea or vomiting Anesthetic complications: no   No notable events documented.  Last Vitals:  Vitals:   07/28/22 1215 07/28/22 1230  BP: 120/71 110/73  Pulse: 73 70  Resp: 15 14  Temp:  36.4 C  SpO2: 98% 97%    Last Pain:  Vitals:   07/28/22 1202  TempSrc:   PainSc: [place]

## 2022-07-31 ENCOUNTER — Encounter: Payer: Self-pay | Admitting: Hematology & Oncology

## 2022-07-31 DIAGNOSIS — Z9221 Personal history of antineoplastic chemotherapy: Secondary | ICD-10-CM

## 2022-07-31 HISTORY — DX: Personal history of antineoplastic chemotherapy: Z92.21

## 2022-07-31 NOTE — Progress Notes (Signed)
Pharmacist Chemotherapy Monitoring - Initial Assessment    Anticipated start date: 08/07/22   The following has been reviewed per standard work regarding the patient's treatment regimen: The patient's diagnosis, treatment plan and drug doses, and organ/hematologic function Lab orders and baseline tests specific to treatment regimen  The treatment plan start date, drug sequencing, and pre-medications Prior authorization status  Patient's documented medication list, including drug-drug interaction screen and prescriptions for anti-emetics and supportive care specific to the treatment regimen The drug concentrations, fluid compatibility, administration routes, and timing of the medications to be used The patient's access for treatment and lifetime cumulative dose history, if applicable  The patient's medication allergies and previous infusion related reactions, if applicable   Changes made to treatment plan:  treatment plan date  Follow up needed:  N/A   Elick Aguilera, Jacqlyn Larsen, [facility], 07/31/2022  7:43 AM

## 2022-08-03 ENCOUNTER — Encounter (HOSPITAL_COMMUNITY): Payer: Self-pay | Admitting: General Surgery

## 2022-08-03 ENCOUNTER — Other Ambulatory Visit: Payer: Self-pay | Admitting: *Deleted

## 2022-08-03 ENCOUNTER — Telehealth: Payer: Self-pay | Admitting: *Deleted

## 2022-08-03 DIAGNOSIS — C50912 Malignant neoplasm of unspecified site of left female breast: Secondary | ICD-10-CM

## 2022-08-03 MED ORDER — DEXAMETHASONE 4 MG PO TABS: ORAL_TABLET | ORAL | 1 refills | Status: DC

## 2022-08-03 MED ORDER — ONDANSETRON HCL 8 MG PO TABS: 8.0000 mg | ORAL_TABLET | Freq: Three times a day (TID) | ORAL | 1 refills | Status: DC | PRN

## 2022-08-03 MED ORDER — PROCHLORPERAZINE MALEATE 10 MG PO TABS: 10.0000 mg | ORAL_TABLET | Freq: Four times a day (QID) | ORAL | 1 refills | Status: DC | PRN

## 2022-08-03 NOTE — Telephone Encounter (Signed)
Called patient to review Nausea medication instructions that were released to her pharmacy.  Patient also had questions regarding peripheral neuropathy, and mouth sores and wig places.  Answered patients questions and told patient to call us if any further questions.

## 2022-08-03 NOTE — Therapy (Signed)
OUTPATIENT PHYSICAL THERAPY BREAST CANCER POST OP FOLLOW UP   Patient Name: Holly Leach MRN: [id number] DOB:Oct 27, 1970, 52 y.o., female Today's Date: 08/05/2022  END OF SESSION:  PT End of Session - 08/05/22 1604     Visit Number 2    Number of Visits 10    Date for PT Re-Evaluation 09/02/22    PT Start Time 1604    PT Stop Time [id number]    PT Time Calculation (min) 39 min    Activity Tolerance Patient tolerated treatment well    Behavior During Therapy WFL for tasks assessed/performed             Past Medical History:  Diagnosis Date   ACL tear    right knee   Cancer ([place])    Chronic headaches    Diabetes mellitus without complication ([place])    GERD (gastroesophageal reflux disease)    d/t trulicity   Hyperlipidemia    Hypertension    Hypothyroidism    OSA on CPAP    PE (pulmonary embolism) 2006   Pre-diabetes    Stage II breast cancer, left ([place]) 07/27/2022   Thyroid disease    Tuberculosis    as a child 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   Past Surgical History:  Procedure Laterality Date   BREAST BIOPSY Left 06/19/2022   [place] LT BREAST BX W LOC DEV 1ST LESION IMG BX SPEC US GUIDE 06/19/2022 [facility]   BREAST BIOPSY  07/07/2022   MM LT RADIOACTIVE SEED LOC MAMMO GUIDE 07/07/2022 [facility]   BREAST LUMPECTOMY WITH RADIOACTIVE SEED AND SENTINEL LYMPH NODE BIOPSY Left 07/08/2022   Procedure: LEFT BREAST LUMPECTOMY WITH RADIOACTIVE SEED AND SENTINEL LYMPH NODE BIOPSY;  Surgeon: Stark Klein, MD;  Location: [facility];  Service: General;  Laterality: Left;   COLONOSCOPY     COLPOSCOPY  2006   FINE NEEDLE ASPIRATION Left 07/28/2022   Procedure: ASPIRATION OF LEFT BREAST SEROMA;  Surgeon: Stark Klein, MD;  Location: [facility];  Service: General;  Laterality: Left;   FRACTURE SURGERY Left 2020   foot - pt denies having surgery for this   [place] Left 07/28/2022   Procedure: INSERTION PORT-A-CATH WITH ULTRASOUND  GUIDANCE;  Surgeon: Stark Klein, MD;  Location: [facility];  Service: General;  Laterality: Left;   Patient Active Problem List   Diagnosis Date Noted   Stage II breast cancer, left ([place]) 07/27/2022   Neck mass 11/03/2013   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/03/2013    PCP: Horald Pollen, MD  REFERRING PROVIDER: Stark Klein, MD  REFERRING DIAG: [phone] (ICD-10-CM) - Malignant neoplasm of upper-outer quadrant of left breast in female, estrogen receptor positive ([place])   THERAPY DIAG:  Localized edema  Stiffness of left shoulder, not elsewhere classified  Aftercare following surgery for neoplasm  Abnormal posture  Malignant neoplasm of upper-outer quadrant of left breast in female, estrogen receptor positive ([place])  Rationale for Evaluation and Treatment: Rehabilitation  ONSET DATE: 06/19/22  SUBJECTIVE:  SUBJECTIVE STATEMENT: Two weeks after my lumpectomy I developed a bad seroma. When they placed they port they drained 90 cc of fluid. When I saw the doctor yesterday they said that there was another one and they drained 20 cc of fluid.   PERTINENT HISTORY:  Patient was diagnosed on 06/19/22 with left grade 2. It measures 1.4 x1.3 x 1.1 cm and is located in the upper outer quadrant. It is ER+, PR-, HER2-  with a Ki67 of 85%. Plan is to undergo a L breast lumpectomy and SLNB on 07/08/22. 07/08/22- L breast lumpectomy and SLNB (0/3)  PATIENT GOALS:  Reassess how my recovery is going related to arm function, pain, and swelling.  PAIN:  Are you having pain? Yes: NPRS scale: 5/10 Pain location: L breast Pain description: aching Aggravating factors: touching it  Relieving factors: tylenol  PRECAUTIONS: Recent Surgery, left UE Lymphedema risk,   ACTIVITY LEVEL / LEISURE: has not been able to do any exercise secondary  to development of seroma and pain   OBJECTIVE:   PATIENT SURVEYS:  QUICK DASH:  Quick Dash - 08/05/22 0001     Open a tight or new jar No difficulty    Do heavy household chores (wash walls, wash floors) No difficulty    Carry a shopping bag or briefcase Mild difficulty    Wash your back Mild difficulty    Use a knife to cut food No difficulty    Recreational activities in which you take some force or impact through your arm, shoulder, or hand (golf, hammering, tennis) Mild difficulty    During the past week, to what extent has your arm, shoulder or hand problem interfered with your normal social activities with family, friends, neighbors, or groups? Slightly    During the past week, to what extent has your arm, shoulder or hand problem limited your work or other regular daily activities Slightly    Arm, shoulder, or hand pain. Moderate    Tingling (pins and needles) in your arm, shoulder, or hand None    Difficulty Sleeping Moderate difficulty    DASH Score 20.45 %              OBSERVATIONS: L lumpectomy wound is healing well. There is a large palpable seroma possibly two in this area.   POSTURE:  Forward head and rounded shoulders posture   UPPER EXTREMITY AROM/PROM:   A/PROM RIGHT   eval    Shoulder extension 65  Shoulder flexion 174  Shoulder abduction 174  Shoulder internal rotation 47  Shoulder external rotation 80                          (Blank rows = not tested)   A/PROM LEFT   eval LEFT 08/05/22  Shoulder extension 50 46  Shoulder flexion 169 149  Shoulder abduction 175 129  Shoulder internal rotation 69 71  Shoulder external rotation 89 92                          (Blank rows = not tested)   CERVICAL AROM: All within normal limits:      Percent limited  Flexion WFL  Extension WFL  Right lateral flexion WFL  Left lateral flexion WFL  Right rotation WFL  Left rotation WFL      UPPER EXTREMITY STRENGTH: 5/5   LYMPHEDEMA ASSESSMENTS:     LANDMARK RIGHT   eval  10 cm  proximal to olecranon process 40  Olecranon process 30.1  10 cm proximal to ulnar styloid process 25.5  Just proximal to ulnar styloid process 17.5  Across hand at thumb web space 19  At base of 2nd digit 6.4  (Blank rows = not tested)   LANDMARK LEFT   eval LEFT 08/05/22  10 cm proximal to olecranon process 38.5 38.5  Olecranon process '30 30  10 '$ cm proximal to ulnar styloid process 24.9 25.1  Just proximal to ulnar styloid process 16 16  Across hand at thumb web space 19.5 19  At base of 2nd digit 6.4 6.3  (Blank rows = not tested)  Surgery type/Date: 07/08/22 L breast lumpectomy and SLNB Number of lymph nodes removed: 0/3 Current/past treatment (chemo, radiation, hormone therapy): will begin chemo on 08/07/22, 4 treatments once every 3 weeks (12 wks) then will begin radiation for 4 weeks, then will need hormone therapy  Other symptoms:  Heaviness/tightness No Pain Yes Pitting edema No Infections No Decreased scar mobility Yes Stemmer sign No  PATIENT EDUCATION:  Education details: how compression helps a seroma heal, need for ROM exercises Person educated: Patient Education method: Explanation Education comprehension: verbalized understanding  TREATMENT PERFORMED: 08/05/22: Created 1/2 inch foam pad and placed in stockinette for pt to wear in her compression bra on top of the lumpectomy scar to avoid any irritation. Measured pt for a prairie hugger compression bra and issued her info to obtain.   HOME EXERCISE PROGRAM: Reviewed previously given post op HEP. Obtain compression bra  ASSESSMENT:  CLINICAL IMPRESSION: Pt returns to PT after undergoing a L lumpectomy and SLNB (0/3) on 07/08/22. She developed a seroma 2 weeks later and had to have it drained twice. She has not been able to wear her compression bra because it was rubbing her incision. Educated pt on importance of wearing her compression bra and cut 1/2 grey foam and placed in  stockinette for pt to wear in her bra over the incision to keep it from getting irritated. Measured pt for a compression bra since she only has one. She has decreased L shoulder ROM and pain at the site of her port with UE ROM. Pt would benefit from skilled PT services to improve R shoulder ROM, decrease discomfort and decrease edema.   Pt will benefit from skilled therapeutic intervention to improve on the following deficits: Decreased knowledge of precautions, impaired UE functional use, pain, decreased ROM, postural dysfunction.   PT treatment/interventions: ADL/Self care home management, Therapeutic exercises, Therapeutic activity, Patient/Family education, Self Care, Joint mobilization, Orthotic/Fit training, Manual lymph drainage, Compression bandaging, scar mobilization, Vasopneumatic device, Manual therapy, and Re-evaluation   GOALS: Goals reviewed with patient? Yes  LONG TERM GOALS:  (STG=LTG)  GOALS Name Target Date  Goal status  1 Pt will demonstrate she has regained full shoulder ROM and function post operatively compared to baselines.  Baseline: 09/02/22 NEW  2 Pt will obtain an appropriate compression bra for long term management of lymphedema. 09/02/22 NEW  3 Pt will be independent in self MLD for long term management of lymphedema. 09/02/22 NEW  4 Pt will be independent in a home exercise program for continued stretching and strengthening.  09/02/22 NEW     PLAN:  PT FREQUENCY/DURATION: 2x/wk for 4 wks  PLAN FOR NEXT SESSION: how are exercises? Did she get new bra and how is foam? MLD to L breast, ROM to L shoulder, give ABC class info   [facility]  [place],  [place] [address]  [phone]  After Breast Cancer Class It is recommended you attend the ABC class to be educated on lymphedema risk reduction. This class is free of charge and lasts for 1 hour. It is a 1-time class. You will need to download the Webex app either on your  phone or computer. We will send you a link the night before or the morning of the class. You should be able to click on that link to join the class. This is not a confidential class. You don't have to turn your camera on, but other participants may be able to see your email address.  Scar massage You can begin gentle scar massage to you incision sites. Gently place one hand on the incision and move the skin (without sliding on the skin) in various directions. Do this for a few minutes and then you can gently massage either coconut oil or vitamin E cream into the scars.  Compression garment You should continue wearing your compression bra until you feel like you no longer have swelling.  Home exercise Program Continue doing the exercises you were given until you feel like you can do them without feeling any tightness at the end.   Walking Program Studies show that 30 minutes of walking per day (fast enough to elevate your heart rate) can significantly reduce the risk of a cancer recurrence. If you can't walk due to other medical reasons, we encourage you to find another activity you could do (like a stationary bike or water exercise).  Posture After breast cancer surgery, people frequently sit with rounded shoulders posture because it puts their incisions on slack and feels better. If you sit like this and scar tissue forms in that position, you can become very tight and have pain sitting or standing with good posture. Try to be aware of your posture and sit and stand up tall to heal properly.  Follow up PT: It is recommended you return every 3 months for the first 3 years following surgery to be assessed on the SOZO machine for an L-Dex score. This helps prevent clinically significant lymphedema in 95% of patients. These follow up screens are 10 minute appointments that you are not billed for.  Allyson Sabal [place], PT 08/05/2022, 5:11 PM

## 2022-08-04 ENCOUNTER — Other Ambulatory Visit: Payer: BC Managed Care – PPO

## 2022-08-04 ENCOUNTER — Ambulatory Visit: Payer: BC Managed Care – PPO | Admitting: Hematology & Oncology

## 2022-08-05 ENCOUNTER — Ambulatory Visit: Payer: BC Managed Care – PPO | Attending: General Surgery | Admitting: Physical Therapy

## 2022-08-05 ENCOUNTER — Encounter: Payer: Self-pay | Admitting: Physical Therapy

## 2022-08-05 ENCOUNTER — Encounter: Payer: Self-pay | Admitting: Hematology & Oncology

## 2022-08-05 DIAGNOSIS — C50412 Malignant neoplasm of upper-outer quadrant of left female breast: Secondary | ICD-10-CM | POA: Insufficient documentation

## 2022-08-05 DIAGNOSIS — M25612 Stiffness of left shoulder, not elsewhere classified: Secondary | ICD-10-CM | POA: Insufficient documentation

## 2022-08-05 DIAGNOSIS — R600 Localized edema: Secondary | ICD-10-CM | POA: Insufficient documentation

## 2022-08-05 DIAGNOSIS — Z170 Estrogen receptor positive status [ER+]: Secondary | ICD-10-CM | POA: Diagnosis present

## 2022-08-05 DIAGNOSIS — R293 Abnormal posture: Secondary | ICD-10-CM | POA: Diagnosis present

## 2022-08-05 DIAGNOSIS — Z483 Aftercare following surgery for neoplasm: Secondary | ICD-10-CM | POA: Diagnosis present

## 2022-08-06 ENCOUNTER — Encounter: Payer: Self-pay | Admitting: *Deleted

## 2022-08-06 DIAGNOSIS — C50912 Malignant neoplasm of unspecified site of left female breast: Secondary | ICD-10-CM

## 2022-08-06 DIAGNOSIS — T451X5A Adverse effect of antineoplastic and immunosuppressive drugs, initial encounter: Secondary | ICD-10-CM

## 2022-08-06 DIAGNOSIS — Z170 Estrogen receptor positive status [ER+]: Secondary | ICD-10-CM

## 2022-08-06 MED ORDER — UNABLE TO FIND: 0 refills | Status: DC

## 2022-08-06 NOTE — Progress Notes (Signed)
Patient requesting a prescription for a cranial prosthesis be faxed to [facility] [phone]). She is going this morning.   Order placed, signed and faxed as requested.   Oncology Nurse Navigator Documentation     08/06/2022   10:15 AM  Oncology Nurse Navigator Flowsheets  Navigator Follow Up Date: 08/07/2022  Navigator Follow Up Reason: Chemotherapy  Navigator Location [facility]  Navigator Encounter Type Telephone  Telephone Incoming Call  Patient Visit Type MedOnc  Treatment Phase Active Tx  Barriers/Navigation Needs Coordination of Care;Education  Interventions Coordination of Care  Acuity Level 2-Minimal Needs (1-2 Barriers Identified)  Coordination of Care Other  Support Groups/Services Friends and Family  Specialty Items/DME Wigs  Time Spent with Patient 15

## 2022-08-07 ENCOUNTER — Inpatient Hospital Stay: Payer: BC Managed Care – PPO

## 2022-08-07 ENCOUNTER — Encounter: Payer: Self-pay | Admitting: *Deleted

## 2022-08-07 ENCOUNTER — Inpatient Hospital Stay: Payer: BC Managed Care – PPO | Attending: Hematology & Oncology

## 2022-08-07 VITALS — BP 136/71 | HR 96 | Temp 97.9°F | Resp 18

## 2022-08-07 DIAGNOSIS — Z5189 Encounter for other specified aftercare: Secondary | ICD-10-CM | POA: Insufficient documentation

## 2022-08-07 DIAGNOSIS — Z5111 Encounter for antineoplastic chemotherapy: Secondary | ICD-10-CM | POA: Diagnosis not present

## 2022-08-07 DIAGNOSIS — C50912 Malignant neoplasm of unspecified site of left female breast: Secondary | ICD-10-CM | POA: Diagnosis present

## 2022-08-07 DIAGNOSIS — Z170 Estrogen receptor positive status [ER+]: Secondary | ICD-10-CM

## 2022-08-07 LAB — CBC WITH DIFFERENTIAL (CANCER CENTER ONLY)
Abs Immature Granulocytes: 0.09 10*3/uL — ABNORMAL HIGH (ref 0.00–0.07)
Basophils Absolute: 0 10*3/uL (ref 0.0–0.1)
Basophils Relative: 0 %
Eosinophils Absolute: 0 10*3/uL (ref 0.0–0.5)
Eosinophils Relative: 0 %
HCT: 34.5 % — ABNORMAL LOW (ref 36.0–46.0)
Hemoglobin: 11.6 g/dL — ABNORMAL LOW (ref 12.0–15.0)
Immature Granulocytes: 1 %
Lymphocytes Relative: 8 %
Lymphs Abs: 0.8 10*3/uL (ref 0.7–4.0)
MCH: 27.9 pg (ref 26.0–34.0)
MCHC: 33.6 g/dL (ref 30.0–36.0)
MCV: 82.9 fL (ref 80.0–100.0)
Monocytes Absolute: 0.1 10*3/uL (ref 0.1–1.0)
Monocytes Relative: 1 %
Neutro Abs: 9.1 10*3/uL — ABNORMAL HIGH (ref 1.7–7.7)
Neutrophils Relative %: 90 %
Platelet Count: 280 10*3/uL (ref 150–400)
RBC: 4.16 MIL/uL (ref 3.87–5.11)
RDW: 15.1 % (ref 11.5–15.5)
WBC Count: 10.1 10*3/uL (ref 4.0–10.5)
nRBC: 0 % (ref 0.0–0.2)

## 2022-08-07 LAB — CMP (CANCER CENTER ONLY)
ALT: 34 U/L (ref 0–44)
AST: 27 U/L (ref 15–41)
Albumin: 4.7 g/dL (ref 3.5–5.0)
Alkaline Phosphatase: 82 U/L (ref 38–126)
Anion gap: 12 (ref 5–15)
BUN: 31 mg/dL — ABNORMAL HIGH (ref 6–20)
CO2: 21 mmol/L — ABNORMAL LOW (ref 22–32)
Calcium: 10.2 mg/dL (ref 8.9–10.3)
Chloride: 101 mmol/L (ref 98–111)
Creatinine: 1.41 mg/dL — ABNORMAL HIGH (ref 0.44–1.00)
GFR, Estimated: 45 mL/min — ABNORMAL LOW (ref >60)
Glucose, Bld: 173 mg/dL — ABNORMAL HIGH (ref 70–99)
Potassium: 4.5 mmol/L (ref 3.5–5.1)
Sodium: 134 mmol/L — ABNORMAL LOW (ref 135–145)
Total Bilirubin: 0.4 mg/dL (ref 0.3–1.2)
Total Protein: 7.9 g/dL (ref 6.5–8.1)

## 2022-08-07 MED ORDER — ALPRAZOLAM 0.25 MG PO TABS: 0.2500 mg | ORAL_TABLET | Freq: Every evening | ORAL | 0 refills | Status: AC | PRN

## 2022-08-07 MED ORDER — SODIUM CHLORIDE 0.9% FLUSH
10.0000 mL | Freq: Once | INTRAVENOUS | Status: AC
  Administered 2022-08-07: 10 mL

## 2022-08-07 MED ORDER — HEPARIN SOD (PORK) LOCK FLUSH 100 UNIT/ML IV SOLN
500.0000 [IU] | Freq: Once | INTRAVENOUS | Status: AC
  Administered 2022-08-07: 500 [IU] via INTRAVENOUS

## 2022-08-07 NOTE — Progress Notes (Signed)
Hold treatment today due to lumpectomy infection being treated with anitbiotics by Dr. Barry Dienes. Patient verbalized understanding.

## 2022-08-07 NOTE — Progress Notes (Addendum)
Patient is here with her husband to start treatment. She has had chemo education and educator called and spoke to her yesterday to clarify education and answer questions. She is ready to start today, however anxious.   After Dr Marin Olp reviewed patient condition, including infection present at lumpectomy site, chemo will be pushed back one week.   Oncology Nurse Navigator Documentation     08/07/2022   10:00 AM  Oncology Nurse Navigator Flowsheets  Planned Course of Treatment Chemotherapy  Phase of Treatment Chemo  Chemotherapy Pending- Reason: Oncologist Choice  Navigator Follow Up Date: 08/13/2022  Navigator Follow Up Reason: Chemotherapy  Navigator Location [facility]  Navigator Encounter Type Treatment  Patient Visit Type MedOnc  Treatment Phase Active Tx  Barriers/Navigation Needs Coordination of Care;Education  Interventions Psycho-Social Support  Acuity Level 2-Minimal Needs (1-2 Barriers Identified)  Support Groups/Services Friends and Family  Time Spent with Patient 15  Genetic Counseling Type None

## 2022-08-07 NOTE — Addendum Note (Signed)
Addended by: Burney Gauze R on: 08/07/2022 10:40 AM   Modules accepted: Orders

## 2022-08-07 NOTE — Patient Instructions (Signed)

## 2022-08-10 ENCOUNTER — Inpatient Hospital Stay: Payer: BC Managed Care – PPO

## 2022-08-10 ENCOUNTER — Other Ambulatory Visit: Payer: Self-pay | Admitting: *Deleted

## 2022-08-10 DIAGNOSIS — C50912 Malignant neoplasm of unspecified site of left female breast: Secondary | ICD-10-CM

## 2022-08-11 ENCOUNTER — Ambulatory Visit: Payer: BC Managed Care – PPO | Admitting: Physical Therapy

## 2022-08-12 ENCOUNTER — Ambulatory Visit: Payer: BC Managed Care – PPO | Admitting: Rehabilitation

## 2022-08-12 DIAGNOSIS — Z483 Aftercare following surgery for neoplasm: Secondary | ICD-10-CM

## 2022-08-12 DIAGNOSIS — M25612 Stiffness of left shoulder, not elsewhere classified: Secondary | ICD-10-CM

## 2022-08-12 DIAGNOSIS — R293 Abnormal posture: Secondary | ICD-10-CM

## 2022-08-12 DIAGNOSIS — R600 Localized edema: Secondary | ICD-10-CM | POA: Diagnosis not present

## 2022-08-12 DIAGNOSIS — C50412 Malignant neoplasm of upper-outer quadrant of left female breast: Secondary | ICD-10-CM

## 2022-08-12 NOTE — Therapy (Signed)
OUTPATIENT PHYSICAL THERAPY BREAST CANCER TREATMENT   Patient Name: Holly Leach MRN: [id number] DOB:[birth year], 52 y.o., female Today's Date: 08/12/2022  END OF SESSION:    Past Medical History:  Diagnosis Date   ACL tear    right knee   Cancer ([place])    Chronic headaches    Diabetes mellitus without complication ([place])    GERD (gastroesophageal reflux disease)    d/t trulicity   Hyperlipidemia    Hypertension    Hypothyroidism    OSA on CPAP    PE (pulmonary embolism) 2006   Pre-diabetes    Stage II breast cancer, left ([place]) 07/27/2022   Thyroid disease    Tuberculosis    as a child 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   Past Surgical History:  Procedure Laterality Date   BREAST BIOPSY Left 06/19/2022   [place] LT BREAST BX W LOC DEV 1ST LESION IMG BX SPEC US GUIDE 06/19/2022 [facility]   BREAST BIOPSY  07/07/2022   MM LT RADIOACTIVE SEED LOC MAMMO GUIDE 07/07/2022 [facility]   BREAST LUMPECTOMY WITH RADIOACTIVE SEED AND SENTINEL LYMPH NODE BIOPSY Left 07/08/2022   Procedure: LEFT BREAST LUMPECTOMY WITH RADIOACTIVE SEED AND SENTINEL LYMPH NODE BIOPSY;  Surgeon: Stark Klein, MD;  Location: [facility];  Service: General;  Laterality: Left;   COLONOSCOPY     COLPOSCOPY  2006   FINE NEEDLE ASPIRATION Left 07/28/2022   Procedure: ASPIRATION OF LEFT BREAST SEROMA;  Surgeon: Stark Klein, MD;  Location: [facility];  Service: General;  Laterality: Left;   FRACTURE SURGERY Left 2020   foot - pt denies having surgery for this   [place] Left 07/28/2022   Procedure: INSERTION PORT-A-CATH WITH ULTRASOUND GUIDANCE;  Surgeon: Stark Klein, MD;  Location: [facility];  Service: General;  Laterality: Left;   Patient Active Problem List   Diagnosis Date Noted   Stage II breast cancer, left ([place]) 07/27/2022   Neck mass 11/03/2013   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/03/2013    PCP: Horald Pollen, MD  REFERRING PROVIDER: Stark Klein, MD  REFERRING DIAG:  C50.412,Z17.0 (ICD-10-CM) - Malignant neoplasm of upper-outer quadrant of left breast in female, estrogen receptor positive ([place])   THERAPY DIAG:  No diagnosis found.  Rationale for Evaluation and Treatment: Rehabilitation  ONSET DATE: 06/19/22  SUBJECTIVE:                                                                                                                                                                                           SUBJECTIVE STATEMENT: I think it is feeling back up.  I couldn't do my first infusion because they put me on antibiotics for the breast.  Off them now.    PERTINENT HISTORY:  Patient was diagnosed on 06/19/22 with left grade 2. It measures 1.4 x1.3 x 1.1 cm and is located in the upper outer quadrant. It is ER+, PR-, HER2-  with a Ki67 of 85%. Plan is to undergo a L breast lumpectomy and SLNB on 07/08/22. 07/08/22- L breast lumpectomy and SLNB (0/3)  PATIENT GOALS:  Reassess how my recovery is going related to arm function, pain, and swelling.  PAIN:  Are you having pain? Yes: NPRS scale: 5/10 Pain location: L breast Pain description: aching Aggravating factors: touching it  Relieving factors: tylenol  PRECAUTIONS: Recent Surgery, left UE Lymphedema risk,   ACTIVITY LEVEL / LEISURE: has not been able to do any exercise secondary to development of seroma and pain   OBJECTIVE:  OBSERVATIONS: L lumpectomy wound is healing well. There is a large palpable seroma possibly two in this area.   POSTURE:  Forward head and rounded shoulders posture   UPPER EXTREMITY AROM/PROM:   A/PROM RIGHT   eval    Shoulder extension 65  Shoulder flexion 174  Shoulder abduction 174  Shoulder internal rotation 47  Shoulder external rotation 80                          (Blank rows = not tested)   A/PROM LEFT   eval LEFT 08/05/22  Shoulder extension 50 46  Shoulder flexion 169 149  Shoulder abduction 175 129  Shoulder internal rotation 69 71  Shoulder external  rotation 89 92                          (Blank rows = not tested)   CERVICAL AROM: All within normal limits:      Percent limited  Flexion WFL  Extension WFL  Right lateral flexion WFL  Left lateral flexion WFL  Right rotation WFL  Left rotation WFL      UPPER EXTREMITY STRENGTH: 5/5   LYMPHEDEMA ASSESSMENTS:    LANDMARK RIGHT   eval  10 cm proximal to olecranon process 40  Olecranon process 30.1  10 cm proximal to ulnar styloid process 25.5  Just proximal to ulnar styloid process 17.5  Across hand at thumb web space 19  At base of 2nd digit 6.4  (Blank rows = not tested)   LANDMARK LEFT   eval LEFT 08/05/22  10 cm proximal to olecranon process 38.5 38.5  Olecranon process '30 30  10 '$ cm proximal to ulnar styloid process 24.9 25.1  Just proximal to ulnar styloid process 16 16  Across hand at thumb web space 19.5 19  At base of 2nd digit 6.4 6.3  (Blank rows = not tested)  Surgery type/Date: 07/08/22 L breast lumpectomy and SLNB Number of lymph nodes removed: 0/3 Current/past treatment (chemo, radiation, hormone therapy): will begin chemo on 08/07/22, 4 treatments once every 3 weeks (12 wks) then will begin radiation for 4 weeks, then will need hormone therapy  PATIENT EDUCATION:  Education details: how compression helps a seroma heal, need for ROM exercises Person educated: Patient Education method: Explanation Education comprehension: verbalized understanding  TREATMENT PERFORMED: Pt permission and consent throughout each step of examination and treatment with modification and draping if requested when working on sensitive areas  08/12/22: Checked seroma which is hard to tell if it is  fluid filled but pt feels like it is filled back up so she will contact Dr. Barry Dienes about a third aspiration.  Discussed how we wouldn't do MLD over the seroma if it is filling back up.  PROM performed, MLD supraclavicular region, axilla, upper arm.  PROM is pretty much full without  restriction today.  Discussed chemo side effects, etc with pt a bit anxious about upcoming treatments.    08/05/22: Created 1/2 inch foam pad and placed in stockinette for pt to wear in her compression bra on top of the lumpectomy scar to avoid any irritation. Measured pt for a prairie hugger compression bra and issued her info to obtain.   HOME EXERCISE PROGRAM: Reviewed previously given post op HEP. Obtain compression bra  ASSESSMENT:  CLINICAL IMPRESSION: Pt is doing very well.  She had no limitations with PROM but does have the feeling like her seroma is filling back up so she will see Dr. Barry Dienes and we will proceed from there.  Reemphasized the importance of compression.   GOALS: Goals reviewed with patient? Yes  LONG TERM GOALS:  (STG=LTG)  GOALS Name Target Date  Goal status  1 Pt will demonstrate she has regained full shoulder ROM and function post operatively compared to baselines.  Baseline: 09/02/22 NEW  2 Pt will obtain an appropriate compression bra for long term management of lymphedema. 09/02/22 NEW  3 Pt will be independent in self MLD for long term management of lymphedema. 09/02/22 NEW  4 Pt will be independent in a home exercise program for continued stretching and strengthening.  09/02/22 NEW     PLAN:  PT FREQUENCY/DURATION: 2x/wk for 4 wks  PLAN FOR NEXT SESSION: measure AROM, did they drain again?, ROM to L shoulder, give ABC class info   [facility]  [address], [address]  [place] [address]  [phone]  After Breast Cancer Class It is recommended you attend the ABC class to be educated on lymphedema risk reduction. This class is free of charge and lasts for 1 hour. It is a 1-time class. You will need to download the Webex app either on your phone or computer. We will send you a link the night before or the morning of the class. You should be able to click on that link to join the class. This is not a confidential class. You don't  have to turn your camera on, but other participants may be able to see your email address.  Scar massage You can begin gentle scar massage to you incision sites. Gently place one hand on the incision and move the skin (without sliding on the skin) in various directions. Do this for a few minutes and then you can gently massage either coconut oil or vitamin E cream into the scars.  Compression garment You should continue wearing your compression bra until you feel like you no longer have swelling.  Home exercise Program Continue doing the exercises you were given until you feel like you can do them without feeling any tightness at the end.   Walking Program Studies show that 30 minutes of walking per day (fast enough to elevate your heart rate) can significantly reduce the risk of a cancer recurrence. If you can't walk due to other medical reasons, we encourage you to find another activity you could do (like a stationary bike or water exercise).  Posture After breast cancer surgery, people frequently sit with rounded shoulders posture because it puts their incisions on slack  and feels better. If you sit like this and scar tissue forms in that position, you can become very tight and have pain sitting or standing with good posture. Try to be aware of your posture and sit and stand up tall to heal properly.  Follow up PT: It is recommended you return every 3 months for the first 3 years following surgery to be assessed on the SOZO machine for an L-Dex score. This helps prevent clinically significant lymphedema in 95% of patients. These follow up screens are 10 minute appointments that you are not billed for.  Stark Bray, PT 08/12/2022, 1:48 PM

## 2022-08-13 ENCOUNTER — Inpatient Hospital Stay: Payer: BC Managed Care – PPO

## 2022-08-13 ENCOUNTER — Encounter: Payer: Self-pay | Admitting: *Deleted

## 2022-08-13 VITALS — BP 134/88 | HR 71 | Temp 98.2°F | Resp 18 | Wt 215.0 lb

## 2022-08-13 VITALS — HR 61

## 2022-08-13 DIAGNOSIS — C50912 Malignant neoplasm of unspecified site of left female breast: Secondary | ICD-10-CM

## 2022-08-13 LAB — CBC WITH DIFFERENTIAL (CANCER CENTER ONLY)
Abs Immature Granulocytes: 0.1 10*3/uL — ABNORMAL HIGH (ref 0.00–0.07)
Basophils Absolute: 0 10*3/uL (ref 0.0–0.1)
Basophils Relative: 0 %
Eosinophils Absolute: 0 10*3/uL (ref 0.0–0.5)
Eosinophils Relative: 0 %
HCT: 34.6 % — ABNORMAL LOW (ref 36.0–46.0)
Hemoglobin: 11.7 g/dL — ABNORMAL LOW (ref 12.0–15.0)
Immature Granulocytes: 1 %
Lymphocytes Relative: 6 %
Lymphs Abs: 0.9 10*3/uL (ref 0.7–4.0)
MCH: 28.1 pg (ref 26.0–34.0)
MCHC: 33.8 g/dL (ref 30.0–36.0)
MCV: 83 fL (ref 80.0–100.0)
Monocytes Absolute: 0.6 10*3/uL (ref 0.1–1.0)
Monocytes Relative: 4 %
Neutro Abs: 12.8 10*3/uL — ABNORMAL HIGH (ref 1.7–7.7)
Neutrophils Relative %: 89 %
Platelet Count: 284 10*3/uL (ref 150–400)
RBC: 4.17 MIL/uL (ref 3.87–5.11)
RDW: 15.4 % (ref 11.5–15.5)
WBC Count: 14.4 10*3/uL — ABNORMAL HIGH (ref 4.0–10.5)
nRBC: 0 % (ref 0.0–0.2)

## 2022-08-13 LAB — CMP (CANCER CENTER ONLY)
ALT: 62 U/L — ABNORMAL HIGH (ref 0–44)
AST: 32 U/L (ref 15–41)
Albumin: 4.4 g/dL (ref 3.5–5.0)
Alkaline Phosphatase: 99 U/L (ref 38–126)
Anion gap: 11 (ref 5–15)
BUN: 32 mg/dL — ABNORMAL HIGH (ref 6–20)
CO2: 23 mmol/L (ref 22–32)
Calcium: 10 mg/dL (ref 8.9–10.3)
Chloride: 101 mmol/L (ref 98–111)
Creatinine: 1.15 mg/dL — ABNORMAL HIGH (ref 0.44–1.00)
GFR, Estimated: 58 mL/min — ABNORMAL LOW (ref >60)
Glucose, Bld: 124 mg/dL — ABNORMAL HIGH (ref 70–99)
Potassium: 4.3 mmol/L (ref 3.5–5.1)
Sodium: 135 mmol/L (ref 135–145)
Total Bilirubin: 0.5 mg/dL (ref 0.3–1.2)
Total Protein: 7.5 g/dL (ref 6.5–8.1)

## 2022-08-13 LAB — PREGNANCY, URINE: Preg Test, Ur: NEGATIVE

## 2022-08-13 MED ORDER — SODIUM CHLORIDE 0.9% FLUSH: 10.0000 mL | Freq: Once | INTRAVENOUS | Status: DC

## 2022-08-13 MED ORDER — SODIUM CHLORIDE 0.9 % IV SOLN
75.0000 mg/m2 | Freq: Once | INTRAVENOUS | Status: AC
  Administered 2022-08-13: 160 mg via INTRAVENOUS
  Filled 2022-08-13: qty 16

## 2022-08-13 MED ORDER — SODIUM CHLORIDE 0.9 % IV SOLN: Freq: Once | INTRAVENOUS | Status: AC

## 2022-08-13 MED ORDER — HEPARIN SOD (PORK) LOCK FLUSH 100 UNIT/ML IV SOLN
500.0000 [IU] | Freq: Once | INTRAVENOUS | Status: AC | PRN
  Administered 2022-08-13: 500 [IU]

## 2022-08-13 MED ORDER — SODIUM CHLORIDE 0.9% FLUSH
10.0000 mL | INTRAVENOUS | Status: DC | PRN
  Administered 2022-08-13: 10 mL

## 2022-08-13 MED ORDER — PALONOSETRON HCL INJECTION 0.25 MG/5ML
0.2500 mg | Freq: Once | INTRAVENOUS | Status: AC
  Administered 2022-08-13: 0.25 mg via INTRAVENOUS
  Filled 2022-08-13: qty 5

## 2022-08-13 MED ORDER — SODIUM CHLORIDE 0.9 % IV SOLN
600.0000 mg/m2 | Freq: Once | INTRAVENOUS | Status: AC
  Administered 2022-08-13: 1260 mg via INTRAVENOUS
  Filled 2022-08-13: qty 50

## 2022-08-13 MED ORDER — SODIUM CHLORIDE 0.9 % IV SOLN
10.0000 mg | Freq: Once | INTRAVENOUS | Status: AC
  Administered 2022-08-13: 10 mg via INTRAVENOUS
  Filled 2022-08-13: qty 10

## 2022-08-13 NOTE — Progress Notes (Signed)
Patient cleared to start treatment this week. She was delayed one week due to a seroma/infection at her surgical site. She still has the seroma, but Dr Marin Olp would like to proceed with treatment.   Oncology Nurse Navigator Documentation     08/13/2022   11:00 AM  Oncology Nurse Navigator Flowsheets  Chemotherapy Actual Start Date: 08/13/2022  Navigator Follow Up Date: 09/03/2022  Navigator Follow Up Reason: Follow-up Appointment;Chemotherapy  Navigator Restaurant manager, fast food Encounter Type Treatment  Patient Visit Type MedOnc  Treatment Phase Active Tx  Barriers/Navigation Needs Coordination of Care;Education  Interventions Psycho-Social Support  Acuity Level 2-Minimal Needs (1-2 Barriers Identified)  Support Groups/Services Friends and Family  Time Spent with Patient 15

## 2022-08-13 NOTE — Patient Instructions (Signed)

## 2022-08-13 NOTE — Patient Instructions (Signed)
[facility] [place]  Discharge Instructions: Thank you for choosing [facility] to provide your oncology and hematology care.   If you have a lab appointment with the [facility], please go directly to the [facility] and check in at the registration area.  Wear comfortable clothing and clothing appropriate for easy access to any Portacath or PICC line.   We strive to give you quality time with your provider. You may need to reschedule your appointment if you arrive late (15 or more minutes).  Arriving late affects you and other patients whose appointments are after yours.  Also, if you miss three or more appointments without notifying the office, you may be dismissed from the clinic at the provider's discretion.      For prescription refill requests, have your pharmacy contact our office and allow 72 hours for refills to be completed.    Today you received the following chemotherapy and/or immunotherapy agents Taxotere/Cytoxan      To help prevent nausea and vomiting after your treatment, we encourage you to take your nausea medication as directed.  BELOW ARE SYMPTOMS THAT SHOULD BE REPORTED IMMEDIATELY: *FEVER GREATER THAN 100.4 F (38 C) OR HIGHER *CHILLS OR SWEATING *NAUSEA AND VOMITING THAT IS NOT CONTROLLED WITH YOUR NAUSEA MEDICATION *UNUSUAL SHORTNESS OF BREATH *UNUSUAL BRUISING OR BLEEDING *URINARY PROBLEMS (pain or burning when urinating, or frequent urination) *BOWEL PROBLEMS (unusual diarrhea, constipation, pain near the anus) TENDERNESS IN MOUTH AND THROAT WITH OR WITHOUT PRESENCE OF ULCERS (sore throat, sores in mouth, or a toothache) UNUSUAL RASH, SWELLING OR PAIN  UNUSUAL VAGINAL DISCHARGE OR ITCHING   Items with * indicate a potential emergency and should be followed up as soon as possible or go to the Emergency Department if any problems should occur.  Please show the CHEMOTHERAPY ALERT CARD or IMMUNOTHERAPY ALERT CARD at  check-in to the Emergency Department and triage nurse. Should you have questions after your visit or need to cancel or reschedule your appointment, please contact [facility]  [phone] and follow the prompts.  Office hours are 8:00 a.m. to 4:30 p.m. Monday - Friday. Please note that voicemails left after 4:00 p.m. may not be returned until the following business day.  We are closed weekends and major holidays. You have access to a nurse at all times for urgent questions. Please call the main number to the clinic [phone] and follow the prompts.  For any non-urgent questions, you may also contact your provider using MyChart. We now offer e-Visits for anyone 25 and older to request care online for non-urgent symptoms. For details visit [email or web address].   Also download the MyChart app! Go to the app store, search "MyChart", open the app, select , and log in with your MyChart username and password.

## 2022-08-13 NOTE — Progress Notes (Signed)
Patient states she is having the seroma on her left breast drained tomorrow at 11 am. CBC from today and CMET from last week discussed with Dr. Marin Olp. Ok to treat with increased WBC and ANC today and CMET results last week.

## 2022-08-14 ENCOUNTER — Encounter: Payer: Self-pay | Admitting: Hematology & Oncology

## 2022-08-14 ENCOUNTER — Encounter: Payer: Self-pay | Admitting: *Deleted

## 2022-08-14 ENCOUNTER — Inpatient Hospital Stay: Payer: BC Managed Care – PPO

## 2022-08-14 VITALS — BP 114/72 | HR 77 | Temp 98.3°F | Resp 18

## 2022-08-14 DIAGNOSIS — C50912 Malignant neoplasm of unspecified site of left female breast: Secondary | ICD-10-CM

## 2022-08-14 MED ORDER — PEGFILGRASTIM-CBQV 6 MG/0.6ML ~~LOC~~ SOSY
6.0000 mg | PREFILLED_SYRINGE | Freq: Once | SUBCUTANEOUS | Status: AC
  Administered 2022-08-14: 6 mg via SUBCUTANEOUS
  Filled 2022-08-14: qty 0.6

## 2022-08-14 NOTE — Patient Instructions (Signed)

## 2022-08-16 ENCOUNTER — Emergency Department (HOSPITAL_BASED_OUTPATIENT_CLINIC_OR_DEPARTMENT_OTHER): Payer: BC Managed Care – PPO

## 2022-08-16 ENCOUNTER — Other Ambulatory Visit: Payer: Self-pay

## 2022-08-16 ENCOUNTER — Emergency Department (HOSPITAL_BASED_OUTPATIENT_CLINIC_OR_DEPARTMENT_OTHER)
Admission: EM | Admit: 2022-08-16 | Discharge: 2022-08-17 | Disposition: A | Discharge status: Home / Self Care | Payer: BC Managed Care – PPO | Attending: Emergency Medicine | Admitting: Emergency Medicine

## 2022-08-16 DIAGNOSIS — Z794 Long term (current) use of insulin: Secondary | ICD-10-CM | POA: Diagnosis not present

## 2022-08-16 DIAGNOSIS — R1084 Generalized abdominal pain: Secondary | ICD-10-CM | POA: Diagnosis present

## 2022-08-16 DIAGNOSIS — Z8719 Personal history of other diseases of the digestive system: Secondary | ICD-10-CM

## 2022-08-16 DIAGNOSIS — K5903 Drug induced constipation: Secondary | ICD-10-CM

## 2022-08-16 DIAGNOSIS — K859 Acute pancreatitis without necrosis or infection, unspecified: Secondary | ICD-10-CM

## 2022-08-16 DIAGNOSIS — Z853 Personal history of malignant neoplasm of breast: Secondary | ICD-10-CM | POA: Diagnosis not present

## 2022-08-16 DIAGNOSIS — K5900 Constipation, unspecified: Secondary | ICD-10-CM | POA: Insufficient documentation

## 2022-08-16 HISTORY — DX: Personal history of other diseases of the digestive system: Z87.19

## 2022-08-16 LAB — URINALYSIS, ROUTINE W REFLEX MICROSCOPIC
Bilirubin Urine: NEGATIVE
Glucose, UA: NEGATIVE mg/dL
Hgb urine dipstick: NEGATIVE
Ketones, ur: NEGATIVE mg/dL
Nitrite: NEGATIVE
Protein, ur: NEGATIVE mg/dL
Specific Gravity, Urine: 1.02 (ref 1.005–1.030)
pH: 5.5 (ref 5.0–8.0)

## 2022-08-16 LAB — COMPREHENSIVE METABOLIC PANEL
ALT: 51 U/L — ABNORMAL HIGH (ref 0–44)
AST: 39 U/L (ref 15–41)
Albumin: 3.8 g/dL (ref 3.5–5.0)
Alkaline Phosphatase: 79 U/L (ref 38–126)
Anion gap: 8 (ref 5–15)
BUN: 37 mg/dL — ABNORMAL HIGH (ref 6–20)
CO2: 26 mmol/L (ref 22–32)
Calcium: 8.8 mg/dL — ABNORMAL LOW (ref 8.9–10.3)
Chloride: 96 mmol/L — ABNORMAL LOW (ref 98–111)
Creatinine, Ser: 1.16 mg/dL — ABNORMAL HIGH (ref 0.44–1.00)
GFR, Estimated: 57 mL/min — ABNORMAL LOW (ref >60)
Glucose, Bld: 130 mg/dL — ABNORMAL HIGH (ref 70–99)
Potassium: 3.4 mmol/L — ABNORMAL LOW (ref 3.5–5.1)
Sodium: 130 mmol/L — ABNORMAL LOW (ref 135–145)
Total Bilirubin: 0.8 mg/dL (ref 0.3–1.2)
Total Protein: 7.3 g/dL (ref 6.5–8.1)

## 2022-08-16 LAB — CBC
HCT: 34.6 % — ABNORMAL LOW (ref 36.0–46.0)
Hemoglobin: 11.8 g/dL — ABNORMAL LOW (ref 12.0–15.0)
MCH: 28 pg (ref 26.0–34.0)
MCHC: 34.1 g/dL (ref 30.0–36.0)
MCV: 82 fL (ref 80.0–100.0)
Platelets: 178 10*3/uL (ref 150–400)
RBC: 4.22 MIL/uL (ref 3.87–5.11)
RDW: 15.2 % (ref 11.5–15.5)
WBC: 14.1 10*3/uL — ABNORMAL HIGH (ref 4.0–10.5)
nRBC: 0 % (ref 0.0–0.2)

## 2022-08-16 LAB — URINALYSIS, MICROSCOPIC (REFLEX)

## 2022-08-16 LAB — LIPASE, BLOOD: Lipase: 98 U/L — ABNORMAL HIGH (ref 11–51)

## 2022-08-16 LAB — PREGNANCY, URINE: Preg Test, Ur: NEGATIVE

## 2022-08-16 NOTE — ED Triage Notes (Signed)
Pt here for generalized abd pain since yesterday. Pt has breat CA, started chemo on Thursday, has not had a BM since then. Pt denies N/V, no fevers/chills.

## 2022-08-16 NOTE — ED Provider Notes (Incomplete)
[facility] [place] Provider Note   CSN: [id number] Arrival date & time: 08/16/22  2126     History  Chief Complaint  Patient presents with  . Abdominal Pain  . Constipation    Holly Leach is a 52 y.o. female.  Patient with a history of stage II breast cancer presents emergency department complaints of generalized abdominal pain.  She reports that this pain began 1 day ago.  Recently started chemo on Thursday and has not bowel movement since then.  Patient denies any nausea, vomiting, diarrhea, fevers, chills.  She reports that she is able to pass gas.  She reports that she has tried eating oatmeal, taking a dose of MiraLAX, and milk of magnesia at home without improvement in bowel movements.   Abdominal Pain Associated symptoms: constipation   Constipation Associated symptoms: abdominal pain        Home Medications Prior to Admission medications   Medication Sig Start Date End Date Taking? Authorizing Provider  ALPRAZolam (XANAX) 0.25 MG tablet Take 0.25 mg by mouth at bedtime. 06/23/22   [provider]  ALPRAZolam Duanne Moron) 0.25 MG tablet Take 1 tablet (0.25 mg total) by mouth at bedtime as needed for anxiety. 08/07/22   Volanda Napoleon, MD  Bacillus Coagulans-Inulin (PROBIOTIC-PREBIOTIC PO) Take 2.9 g by mouth daily.    [provider]  carvedilol (COREG) 3.125 MG tablet Take 3.125 mg by mouth 2 (two) times daily. 02/10/22   [provider]  COLLAGEN PO Take 1 tablet by mouth daily. New skin collagen    [provider]  Continuous Blood Gluc Sensor (FREESTYLE LIBRE 3 SENSOR) MISC  06/24/22   [provider]  cyclobenzaprine (FLEXERIL) 5 MG tablet Take 1 tablet (5 mg total) by mouth 3 (three) times daily as needed for muscle spasms. 07/28/22   Stark Klein, MD  dexamethasone (DECADRON) 4 MG tablet Take 2 tabs by mouth 2 times daily starting day before chemo. Then take 2 tabs daily for 2 days  starting day after chemo. Take with food. 08/03/22   Volanda Napoleon, MD  famotidine (PEPCID) 40 MG tablet Take 40 mg by mouth daily.    [provider]  fluticasone (FLONASE) 50 MCG/ACT nasal spray Place 2 sprays into both nostrils daily as needed for allergies. Patient not taking: Reported on 07/27/2022    [provider]  Levothyroxine Sodium 25 MCG CAPS Take 25 mcg by mouth daily before breakfast.    [provider]  lidocaine-prilocaine (EMLA) cream Apply a dime size to port-a-cath 1-2 hours prior to access. Cover with [employer]. Patient not taking: Reported on 08/07/2022 07/27/22   Volanda Napoleon, MD  lisinopril-hydrochlorothiazide (ZESTORETIC) 20-25 MG tablet Take 1 tablet by mouth 2 (two) times daily.    [provider]  loratadine (CLARITIN) 10 MG tablet Take 10 mg by mouth daily.    [provider]  Melatonin 10 MG TABS Take 10 mg by mouth at bedtime.    [provider]  meloxicam (MOBIC) 15 MG tablet Take 15 mg by mouth daily as needed for pain. Patient not taking: Reported on 07/27/2022 07/13/21   [provider]  montelukast (SINGULAIR) 10 MG tablet Take 10 mg by mouth daily. 05/09/22   [provider]  ondansetron (ZOFRAN) 8 MG tablet Take 1 tablet (8 mg total) by mouth every 8 (eight) hours as needed for nausea or vomiting. Start on the third day after chemotherapy. Patient not taking:  Reported on 08/07/2022 08/03/22   Volanda Napoleon, MD  oxyCODONE (OXY IR/ROXICODONE) 5 MG immediate release tablet Take 1 tablet (5 mg total) by mouth every 6 (six) hours as needed for severe pain. Patient not taking: Reported on 07/27/2022 07/08/22   Stark Klein, MD  prochlorperazine (COMPAZINE) 10 MG tablet Take 1 tablet (10 mg total) by mouth every 6 (six) hours as needed for nausea or vomiting. Patient not taking: Reported on 08/07/2022 08/03/22   Volanda Napoleon, MD  TRULICITY [id number] SOPN Inject 0.75 mg into the skin once a week.  thursday 06/17/22   [provider]  UNABLE TO FIND Cranial Prosthesis 08/06/22   Volanda Napoleon, MD  Vitamin D, Ergocalciferol, (DRISDOL) 1.25 MG (50000 UNIT) CAPS capsule Take 50,000 Units by mouth once a week. 04/14/22   [provider]      Allergies    Augmentin [amoxicillin-pot clavulanate]    Review of Systems   Review of Systems  Gastrointestinal:  Positive for abdominal pain and constipation.  All other systems reviewed and are negative.   Physical Exam Updated Vital Signs BP 128/65   Pulse (!) 104   Temp 98.2 F (36.8 C) (Oral)   Resp 20   LMP 06/02/2015 (Within Years)   SpO2 96%  Physical Exam Vitals and nursing note reviewed.  Constitutional:      Appearance: She is well-developed.  Neurological:     Mental Status: She is alert.     ED Results / Procedures / Treatments   Labs (all labs ordered are listed, but only abnormal results are displayed) Labs Reviewed  LIPASE, BLOOD - Abnormal; Notable for the following components:      Result Value   Lipase 98 (*)    All other components within normal limits  COMPREHENSIVE METABOLIC PANEL - Abnormal; Notable for the following components:   Sodium 130 (*)    Potassium 3.4 (*)    Chloride 96 (*)    Glucose, Bld 130 (*)    BUN 37 (*)    Creatinine, Ser 1.16 (*)    Calcium 8.8 (*)    ALT 51 (*)    GFR, Estimated 57 (*)    All other components within normal limits  CBC - Abnormal; Notable for the following components:   WBC 14.1 (*)    Hemoglobin 11.8 (*)    HCT 34.6 (*)    All other components within normal limits  URINALYSIS, ROUTINE W REFLEX MICROSCOPIC - Abnormal; Notable for the following components:   Leukocytes,Ua SMALL (*)    All other components within normal limits  URINALYSIS, MICROSCOPIC (REFLEX) - Abnormal; Notable for the following components:   Bacteria, UA FEW (*)    Non Squamous Epithelial PRESENT (*)    All other components within normal limits  PREGNANCY, URINE     EKG None  Radiology No results found.  Procedures Procedures   Medications Ordered in ED Medications - No data to display  ED Course/ Medical Decision Making/ A&P                           Medical Decision Making Amount and/or Complexity of Data Reviewed Labs: ordered. Radiology: ordered.   This patient presents to the ED for concern of *** differential diagnosis includes ***    Additional history obtained:  Additional history obtained from *** External records from outside source obtained and reviewed including ***   Lab Tests:  I Ordered,  and personally interpreted labs.  The pertinent results include:  ***   Imaging Studies ordered:  I ordered imaging studies including ***  I independently visualized and interpreted imaging which showed *** I agree with the radiologist interpretation   Medicines ordered and prescription drug management:  I ordered medication including ***  for ***  Reevaluation of the patient after these medicines showed that the patient {resolved/improved/worsened:23923::"improved"} I have reviewed the patients home medicines and have made adjustments as needed   Problem List / ED Course:  ***   Social Determinants of Health:    Final Clinical Impression(s) / ED Diagnoses Final diagnoses:  None    Rx / DC Orders ED Discharge Orders     None

## 2022-08-16 NOTE — ED Provider Notes (Signed)
[facility] Provider Note   CSN: [id number] Arrival date & time: 08/16/22  2126     History  Chief Complaint  Patient presents with   Abdominal Pain   Constipation    Holly Leach is a 52 y.o. female.  Patient with a history of stage II breast cancer presents emergency department complaints of generalized abdominal pain.  She reports that this pain began 1 day ago.  Recently started chemo on Thursday and has not bowel movement since then.  Patient denies any nausea, vomiting, diarrhea, fevers, chills.  She reports that she is able to pass gas.  She reports that she has tried eating oatmeal, taking a dose of MiraLAX, and milk of magnesia at home without improvement in bowel movements.   Abdominal Pain Associated symptoms: constipation   Constipation Associated symptoms: abdominal pain        Home Medications Prior to Admission medications   Medication Sig Start Date End Date Taking? Authorizing Provider  ALPRAZolam (XANAX) 0.25 MG tablet Take 0.25 mg by mouth at bedtime. 06/23/22   [provider]  ALPRAZolam Prudy Feeler) 0.25 MG tablet Take 1 tablet (0.25 mg total) by mouth at bedtime as needed for anxiety. 08/07/22   Josph Macho, MD  Bacillus Coagulans-Inulin (PROBIOTIC-PREBIOTIC PO) Take 2.9 g by mouth daily.    [provider]  carvedilol (COREG) 3.125 MG tablet Take 3.125 mg by mouth 2 (two) times daily. 02/10/22   [provider]  COLLAGEN PO Take 1 tablet by mouth daily. New skin collagen    [provider]  Continuous Blood Gluc Sensor (FREESTYLE LIBRE 3 SENSOR) MISC  06/24/22   [provider]  cyclobenzaprine (FLEXERIL) 5 MG tablet Take 1 tablet (5 mg total) by mouth 3 (three) times daily as needed for muscle spasms. 07/28/22   Almond Lint, MD  dexamethasone (DECADRON) 4 MG tablet Take 2 tabs by mouth 2 times daily starting day before chemo. Then take 2 tabs daily for 2 days  starting day after chemo. Take with food. 08/03/22   Josph Macho, MD  famotidine (PEPCID) 40 MG tablet Take 40 mg by mouth daily.    [provider]  fluticasone (FLONASE) 50 MCG/ACT nasal spray Place 2 sprays into both nostrils daily as needed for allergies. Patient not taking: Reported on 07/27/2022    [provider]  Levothyroxine Sodium 25 MCG CAPS Take 25 mcg by mouth daily before breakfast.    [provider]  lidocaine-prilocaine (EMLA) cream Apply a dime size to port-a-cath 1-2 hours prior to access. Cover with [employer]. Patient not taking: Reported on 08/07/2022 07/27/22   Josph Macho, MD  lisinopril-hydrochlorothiazide (ZESTORETIC) 20-25 MG tablet Take 1 tablet by mouth 2 (two) times daily.    [provider]  loratadine (CLARITIN) 10 MG tablet Take 10 mg by mouth daily.    [provider]  Melatonin 10 MG TABS Take 10 mg by mouth at bedtime.    [provider]  meloxicam (MOBIC) 15 MG tablet Take 15 mg by mouth daily as needed for pain. Patient not taking: Reported on 07/27/2022 07/13/21   [provider]  montelukast (SINGULAIR) 10 MG tablet Take 10 mg by mouth daily. 05/09/22   [provider]  ondansetron (ZOFRAN) 8 MG tablet Take 1 tablet (8 mg total) by mouth every 8 (eight) hours as needed for nausea or vomiting. Start on the third day after chemotherapy. Patient not taking:  Reported on 08/07/2022 08/03/22   Josph Macho, MD  oxyCODONE (OXY IR/ROXICODONE) 5 MG immediate release tablet Take 1 tablet (5 mg total) by mouth every 6 (six) hours as needed for severe pain. Patient not taking: Reported on 07/27/2022 07/08/22   Almond Lint, MD  prochlorperazine (COMPAZINE) 10 MG tablet Take 1 tablet (10 mg total) by mouth every 6 (six) hours as needed for nausea or vomiting. Patient not taking: Reported on 08/07/2022 08/03/22   Josph Macho, MD  TRULICITY 0.75 MG/0.5ML SOPN Inject 0.75 mg into the skin once a week.  thursday 06/17/22   [provider]  UNABLE TO FIND Cranial Prosthesis 08/06/22   Josph Macho, MD  Vitamin D, Ergocalciferol, (DRISDOL) 1.25 MG (50000 UNIT) CAPS capsule Take 50,000 Units by mouth once a week. 04/14/22   [provider]      Allergies    Augmentin [amoxicillin-pot clavulanate]    Review of Systems   Review of Systems  Gastrointestinal:  Positive for abdominal pain and constipation.  All other systems reviewed and are negative.   Physical Exam Updated Vital Signs BP 128/65   Pulse (!) 104   Temp 98.2 F (36.8 C) (Oral)   Resp 20   LMP 06/02/2015 (Within Years)   SpO2 96%  Physical Exam Vitals and nursing note reviewed.  Constitutional:      Appearance: She is well-developed.  Neurological:     Mental Status: She is alert.     ED Results / Procedures / Treatments   Labs (all labs ordered are listed, but only abnormal results are displayed) Labs Reviewed  LIPASE, BLOOD - Abnormal; Notable for the following components:      Result Value   Lipase 98 (*)    All other components within normal limits  COMPREHENSIVE METABOLIC PANEL - Abnormal; Notable for the following components:   Sodium 130 (*)    Potassium 3.4 (*)    Chloride 96 (*)    Glucose, Bld 130 (*)    BUN 37 (*)    Creatinine, Ser 1.16 (*)    Calcium 8.8 (*)    ALT 51 (*)    GFR, Estimated 57 (*)    All other components within normal limits  CBC - Abnormal; Notable for the following components:   WBC 14.1 (*)    Hemoglobin 11.8 (*)    HCT 34.6 (*)    All other components within normal limits  URINALYSIS, ROUTINE W REFLEX MICROSCOPIC - Abnormal; Notable for the following components:   Leukocytes,Ua SMALL (*)    All other components within normal limits  URINALYSIS, MICROSCOPIC (REFLEX) - Abnormal; Notable for the following components:   Bacteria, UA FEW (*)    Non Squamous Epithelial PRESENT (*)    All other components within normal limits  PREGNANCY, URINE     EKG None  Radiology No results found.  Procedures Procedures   Medications Ordered in ED Medications - No data to display  ED Course/ Medical Decision Making/ A&P                           Medical Decision Making Amount and/or Complexity of Data Reviewed Labs: ordered. Radiology: ordered.  Risk Prescription drug management.   This patient presents to the ED for concern of abdominal pain and constipation.  Differential diagnosis includes bowel obstruction, cholecystitis, pancreatitis, colitis   Lab Tests:  I Ordered, and personally interpreted labs.  The pertinent results include:  Dehydration noted with hyponatremia at 130 and chloride at 96, elevated leukocytosis at 14.1, lipase elevated at 98, urinalysis consistent with possible infection with leukocytes and bacteria noted   Imaging Studies ordered:  I ordered imaging studies including CT abdomen I independently visualized and interpreted imaging which showed acute pancreatitis I agree with the radiologist interpretation   Problem List / ED Course:  Patient presents to the emergency department complaints of abdominal pain and constipation.  She reports she is currently receiving chemotherapy for breast cancer and began to have constipation following round of chemotherapy.  She reports that she is typically having bowel movements daily but has now gone several days without a bowel movement but is able to pass gas.  She reports that her abdominal pain is diffuse in nature and not specifically localized to the area of the abdomen.  Denies any prior history of any cholecystitis or pancreatitis.  Given presentation, lab workup and imaging were performed with elevated lipase and CT of abdomen consistent with signs of acute pancreatitis.  Given that patient is not having significant right upper quadrant and epigastric tenderness and has been able to tolerate PO and states that pain is tolerable, I do not believe the patient  is having a severe episode of acute pancreatitis as no pain medications were needed to control pain here in the emergency department prior to arrival.  Advised patient to manage symptoms at home as best she can with over-the-counter pain medications if needed and to ensure that she stays adequately hydrated.  Given the patient does not currently experiencing urinary symptoms, I do not believe that treating the abnormal findings in the urinalysis are appropriate at this time.  Patient agreeable to treatment plan verbalized understanding return precautions.  All questions answered prior to patient discharge.   Social Determinants of Health:  Cancer patient  Final Clinical Impression(s) / ED Diagnoses Final diagnoses:  None    Rx / DC Orders ED Discharge Orders     None         Smitty Knudsen, PA-C 08/18/22 2326    Tegeler, Canary Brim, MD 08/19/22 [phone]

## 2022-08-17 MED ORDER — OXYCODONE HCL 5 MG PO TABS: 5.0000 mg | ORAL_TABLET | ORAL | 0 refills | Status: DC | PRN

## 2022-08-17 NOTE — ED Notes (Signed)
Discharge paperwork reviewed entirely with patient, including Rx's and follow up care. Pain was under control. Pt verbalized understanding as well as all parties involved. No questions or concerns voiced at the time of discharge. No acute distress noted.   Pt ambulated out to PVA without incident or assistance.  

## 2022-08-17 NOTE — Discharge Instructions (Signed)
You are seen in the emergency department for abdominal pain and constipation.  The results were largely reassuring but there was some evidence of pancreatitis based on her elevated lipase level and your CT imaging.  I would manage pain at home with over-the-counter pain medication as tolerated and if needed with oxycodone.  Be aware that oxycodone can cause some constipation so use this for more severe pain if needed.  For constipation, recommend taking MiraLAX daily and also taking stimulant constipation medication if needed.  Please return to the emergency department if her symptoms worsen.

## 2022-08-18 ENCOUNTER — Telehealth: Payer: Self-pay

## 2022-08-18 NOTE — Telephone Encounter (Signed)
Patient called stating she was diagnosed with pancreatis at the ED over the weekend. She is not taking the oxy as she doesn't want to get more constipated so she is trying to manage the pain with tylenol. We discussed the importance of hydration and she will have her husband pick up some pedialyte and gatorades and will do her best at staying hydrated. She does not have an appetite and is weak. She does also feel like her throat is starting to hurt a little. She will try oral hydration and call back tomorrow if she is having a hard time getting in the oral hydration for possible IV fluids in office.

## 2022-08-19 ENCOUNTER — Ambulatory Visit: Payer: BC Managed Care – PPO | Admitting: Physical Therapy

## 2022-08-19 ENCOUNTER — Encounter: Payer: Self-pay | Admitting: Physical Therapy

## 2022-08-19 DIAGNOSIS — Z170 Estrogen receptor positive status [ER+]: Secondary | ICD-10-CM

## 2022-08-19 DIAGNOSIS — R600 Localized edema: Secondary | ICD-10-CM | POA: Diagnosis not present

## 2022-08-19 DIAGNOSIS — Z483 Aftercare following surgery for neoplasm: Secondary | ICD-10-CM

## 2022-08-19 DIAGNOSIS — C50412 Malignant neoplasm of upper-outer quadrant of left female breast: Secondary | ICD-10-CM

## 2022-08-19 DIAGNOSIS — M25612 Stiffness of left shoulder, not elsewhere classified: Secondary | ICD-10-CM

## 2022-08-19 DIAGNOSIS — R293 Abnormal posture: Secondary | ICD-10-CM

## 2022-08-19 NOTE — Therapy (Signed)
OUTPATIENT PHYSICAL THERAPY BREAST CANCER TREATMENT   Patient Name: Holly Leach MRN: [id number] DOB:06-01-1971, 52 y.o., female Today's Date: 08/19/2022  END OF SESSION:  PT End of Session - 08/19/22 1528     Visit Number 4    Number of Visits 10    Date for PT Re-Evaluation 09/02/22    PT Start Time [id number]    PT Stop Time [id number]    PT Time Calculation (min) 32 min    Activity Tolerance Patient tolerated treatment well    Behavior During Therapy WFL for tasks assessed/performed              Past Medical History:  Diagnosis Date   ACL tear    right knee   Cancer ([place])    Chronic headaches    Diabetes mellitus without complication ([place])    GERD (gastroesophageal reflux disease)    d/t trulicity   Hyperlipidemia    Hypertension    Hypothyroidism    OSA on CPAP    PE (pulmonary embolism) 2006   Pre-diabetes    Stage II breast cancer, left ([place]) 07/27/2022   Thyroid disease    Tuberculosis    as a child 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   Past Surgical History:  Procedure Laterality Date   BREAST BIOPSY Left 06/19/2022   [place] LT BREAST BX W LOC DEV 1ST LESION IMG BX SPEC US GUIDE 06/19/2022 [facility]   BREAST BIOPSY  07/07/2022   MM LT RADIOACTIVE SEED LOC MAMMO GUIDE 07/07/2022 [facility]   BREAST LUMPECTOMY WITH RADIOACTIVE SEED AND SENTINEL LYMPH NODE BIOPSY Left 07/08/2022   Procedure: LEFT BREAST LUMPECTOMY WITH RADIOACTIVE SEED AND SENTINEL LYMPH NODE BIOPSY;  Surgeon: Stark Klein, MD;  Location: [facility];  Service: General;  Laterality: Left;   COLONOSCOPY     COLPOSCOPY  2006   FINE NEEDLE ASPIRATION Left 07/28/2022   Procedure: ASPIRATION OF LEFT BREAST SEROMA;  Surgeon: Stark Klein, MD;  Location: [facility];  Service: General;  Laterality: Left;   FRACTURE SURGERY Left 2020   foot - pt denies having surgery for this   [place] Left 07/28/2022   Procedure: INSERTION PORT-A-CATH WITH ULTRASOUND  GUIDANCE;  Surgeon: Stark Klein, MD;  Location: [facility];  Service: General;  Laterality: Left;   Patient Active Problem List   Diagnosis Date Noted   Stage II breast cancer, left ([place]) 07/27/2022   Neck mass 11/03/2013   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/03/2013    PCP: Horald Pollen, MD  REFERRING PROVIDER: Stark Klein, MD  REFERRING DIAG: [phone] (ICD-10-CM) - Malignant neoplasm of upper-outer quadrant of left breast in female, estrogen receptor positive ([place])   THERAPY DIAG:  Localized edema  Stiffness of left shoulder, not elsewhere classified  Aftercare following surgery for neoplasm  Abnormal posture  Malignant neoplasm of upper-outer quadrant of left breast in female, estrogen receptor positive ([place])  Rationale for Evaluation and Treatment: Rehabilitation  ONSET DATE: 06/19/22  SUBJECTIVE:  SUBJECTIVE STATEMENT: I was hospitalized for pancreatitis. I went back to work for a half day yesterday. The breast is good. I went on Friday to have it drained and she was able to get 20 cc off. She is going to look at me again on 3/25.   PERTINENT HISTORY:  Patient was diagnosed on 06/19/22 with left grade 2. It measures 1.4 x1.3 x 1.1 cm and is located in the upper outer quadrant. It is ER+, PR-, HER2-  with a Ki67 of 85%. Plan is to undergo a L breast lumpectomy and SLNB on 07/08/22. 07/08/22- L breast lumpectomy and SLNB (0/3)  PATIENT GOALS:  Reassess how my recovery is going related to arm function, pain, and swelling.  PAIN:  Are you having pain? None  PRECAUTIONS: Recent Surgery, left UE Lymphedema risk,   ACTIVITY LEVEL / LEISURE: has not been able to do any exercise secondary to development of seroma and pain   OBJECTIVE:  OBSERVATIONS: L lumpectomy wound is healing well. There is a large palpable  seroma possibly two in this area.   POSTURE:  Forward head and rounded shoulders posture   UPPER EXTREMITY AROM/PROM:   A/PROM RIGHT   eval    Shoulder extension 65  Shoulder flexion 174  Shoulder abduction 174  Shoulder internal rotation 47  Shoulder external rotation 80                          (Blank rows = not tested)   A/PROM LEFT   eval LEFT 08/05/22  Shoulder extension 50 46  Shoulder flexion 169 149  Shoulder abduction 175 129  Shoulder internal rotation 69 71  Shoulder external rotation 89 92                          (Blank rows = not tested)   CERVICAL AROM: All within normal limits:      Percent limited  Flexion WFL  Extension WFL  Right lateral flexion WFL  Left lateral flexion WFL  Right rotation WFL  Left rotation WFL      UPPER EXTREMITY STRENGTH: 5/5   LYMPHEDEMA ASSESSMENTS:    LANDMARK RIGHT   eval  10 cm proximal to olecranon process 40  Olecranon process 30.1  10 cm proximal to ulnar styloid process 25.5  Just proximal to ulnar styloid process 17.5  Across hand at thumb web space 19  At base of 2nd digit 6.4  (Blank rows = not tested)   LANDMARK LEFT   eval LEFT 08/05/22  10 cm proximal to olecranon process 38.5 38.5  Olecranon process 30 30  10  cm proximal to ulnar styloid process 24.9 25.1  Just proximal to ulnar styloid process 16 16  Across hand at thumb web space 19.5 19  At base of 2nd digit 6.4 6.3  (Blank rows = not tested)  Surgery type/Date: 07/08/22 L breast lumpectomy and SLNB Number of lymph nodes removed: 0/3 Current/past treatment (chemo, radiation, hormone therapy): will begin chemo on 08/07/22, 4 treatments once every 3 weeks (12 wks) then will begin radiation for 4 weeks, then will need hormone therapy  PATIENT EDUCATION:  Education details: how compression helps a seroma heal, need for ROM exercises Person educated: Patient Education method: Explanation Education comprehension: verbalized understanding  TREATMENT  PERFORMED: Pt permission and consent throughout each step of examination and treatment with modification and draping if requested when working on sensitive areas  08/19/22: In supine: Short neck, 5 diaphragmatic breaths, R axillary nodes and establishment of interaxillary pathway, L inguinal nodes and establishment of axilloinguinal pathway, then L breast with focus at area of seroma moving fluid towards pathways spending extra time in any areas of fibrosis then retracing all steps. Throughout MLD spent time doing gentle STM to area of seroma to help soften and seroma was more difficult to palpate by end of session.   08/12/22: Checked seroma which is hard to tell if it is fluid filled but pt feels like it is filled back up so she will contact Dr. Barry Dienes about a third aspiration.  Discussed how we wouldn't do MLD over the seroma if it is filling back up.  PROM performed, MLD supraclavicular region, axilla, upper arm.  PROM is pretty much full without restriction today.  Discussed chemo side effects, etc with pt a bit anxious about upcoming treatments.    08/05/22: Created 1/2 inch foam pad and placed in stockinette for pt to wear in her compression bra on top of the lumpectomy scar to avoid any irritation. Measured pt for a prairie hugger compression bra and issued her info to obtain.   HOME EXERCISE PROGRAM: Reviewed previously given post op HEP. Obtain compression bra  ASSESSMENT:  CLINICAL IMPRESSION: Pt is doing very well.  She had no limitations with PROM but does have the feeling like her seroma is filling back up so she will see Dr. Barry Dienes and we will proceed from there.  Reemphasized the importance of compression.   GOALS: Goals reviewed with patient? Yes  LONG TERM GOALS:  (STG=LTG)  GOALS Name Target Date  Goal status  1 Pt will demonstrate she has regained full shoulder ROM and function post operatively compared to baselines.  Baseline: 09/02/22 NEW  2 Pt will obtain an appropriate  compression bra for long term management of lymphedema. 09/02/22 NEW  3 Pt will be independent in self MLD for long term management of lymphedema. 09/02/22 NEW  4 Pt will be independent in a home exercise program for continued stretching and strengthening.  09/02/22 NEW     PLAN:  PT FREQUENCY/DURATION: 2x/wk for 4 wks  PLAN FOR NEXT SESSION: MLD and STM to seroma, measure AROM, did they drain again?, ROM to L shoulder, signed up for ABC class in April   [facility]  [address]  [place] [address]  [phone]  After Breast Cancer Class It is recommended you attend the ABC class to be educated on lymphedema risk reduction. This class is free of charge and lasts for 1 hour. It is a 1-time class. You will need to download the Webex app either on your phone or computer. We will send you a link the night before or the morning of the class. You should be able to click on that link to join the class. This is not a confidential class. You don't have to turn your camera on, but other participants may be able to see your email address.  Scar massage You can begin gentle scar massage to you incision sites. Gently place one hand on the incision and move the skin (without sliding on the skin) in various directions. Do this for a few minutes and then you can gently massage either coconut oil or vitamin E cream into the scars.  Compression garment You should continue wearing your compression bra until you feel like you no longer have swelling.  Home exercise Program Continue doing the  exercises you were given until you feel like you can do them without feeling any tightness at the end.   Walking Program Studies show that 30 minutes of walking per day (fast enough to elevate your heart rate) can significantly reduce the risk of a cancer recurrence. If you can't walk due to other medical reasons, we encourage you to find another activity you could do (like a stationary  bike or water exercise).  Posture After breast cancer surgery, people frequently sit with rounded shoulders posture because it puts their incisions on slack and feels better. If you sit like this and scar tissue forms in that position, you can become very tight and have pain sitting or standing with good posture. Try to be aware of your posture and sit and stand up tall to heal properly.  Follow up PT: It is recommended you return every 3 months for the first 3 years following surgery to be assessed on the SOZO machine for an L-Dex score. This helps prevent clinically significant lymphedema in 95% of patients. These follow up screens are 10 minute appointments that you are not billed for.  [facility], PT 08/19/2022, 4:01 PM

## 2022-08-20 ENCOUNTER — Encounter: Payer: Self-pay | Admitting: Rehabilitation

## 2022-08-20 ENCOUNTER — Ambulatory Visit: Payer: BC Managed Care – PPO | Admitting: Rehabilitation

## 2022-08-20 DIAGNOSIS — M25612 Stiffness of left shoulder, not elsewhere classified: Secondary | ICD-10-CM

## 2022-08-20 DIAGNOSIS — Z483 Aftercare following surgery for neoplasm: Secondary | ICD-10-CM

## 2022-08-20 DIAGNOSIS — R293 Abnormal posture: Secondary | ICD-10-CM

## 2022-08-20 DIAGNOSIS — R600 Localized edema: Secondary | ICD-10-CM | POA: Diagnosis not present

## 2022-08-20 DIAGNOSIS — Z170 Estrogen receptor positive status [ER+]: Secondary | ICD-10-CM

## 2022-08-20 NOTE — Therapy (Signed)
OUTPATIENT PHYSICAL THERAPY BREAST CANCER TREATMENT   Patient Name: Holly Leach MRN: [id number] DOB:04-03-1971, 52 y.o., female Today's Date: 08/20/2022  END OF SESSION:  PT End of Session - 08/20/22 1455     Visit Number 5    Number of Visits 10    Date for PT Re-Evaluation 09/02/22    PT Start Time 1500    PT Stop Time [id number]    PT Time Calculation (min) 47 min    Activity Tolerance Patient tolerated treatment well    Behavior During Therapy WFL for tasks assessed/performed              Past Medical History:  Diagnosis Date   ACL tear    right knee   Cancer ([place])    Chronic headaches    Diabetes mellitus without complication ([place])    GERD (gastroesophageal reflux disease)    d/t trulicity   Hyperlipidemia    Hypertension    Hypothyroidism    OSA on CPAP    PE (pulmonary embolism) 2006   Pre-diabetes    Stage II breast cancer, left ([place]) 07/27/2022   Thyroid disease    Tuberculosis    as a child 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   Past Surgical History:  Procedure Laterality Date   BREAST BIOPSY Left 06/19/2022   [place] LT BREAST BX W LOC DEV 1ST LESION IMG BX SPEC US GUIDE 06/19/2022 [facility]   BREAST BIOPSY  07/07/2022   MM LT RADIOACTIVE SEED LOC MAMMO GUIDE 07/07/2022 [facility]   BREAST LUMPECTOMY WITH RADIOACTIVE SEED AND SENTINEL LYMPH NODE BIOPSY Left 07/08/2022   Procedure: LEFT BREAST LUMPECTOMY WITH RADIOACTIVE SEED AND SENTINEL LYMPH NODE BIOPSY;  Surgeon: Stark Klein, MD;  Location: [facility];  Service: General;  Laterality: Left;   COLONOSCOPY     COLPOSCOPY  2006   FINE NEEDLE ASPIRATION Left 07/28/2022   Procedure: ASPIRATION OF LEFT BREAST SEROMA;  Surgeon: Stark Klein, MD;  Location: [facility];  Service: General;  Laterality: Left;   FRACTURE SURGERY Left 2020   foot - pt denies having surgery for this   [place] Left 07/28/2022   Procedure: INSERTION PORT-A-CATH WITH ULTRASOUND  GUIDANCE;  Surgeon: Stark Klein, MD;  Location: [facility] [facility] [facility];  Service: General;  Laterality: Left;   Patient Active Problem List   Diagnosis Date Noted   Stage II breast cancer, left ([place]) 07/27/2022   Neck mass 11/03/2013   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/03/2013    PCP: Horald Pollen, MD  REFERRING PROVIDER: Stark Klein, MD  REFERRING DIAG: [phone] (ICD-10-CM) - Malignant neoplasm of upper-outer quadrant of left breast in female, estrogen receptor positive ([place])   THERAPY DIAG:  Localized edema  Stiffness of left shoulder, not elsewhere classified  Aftercare following surgery for neoplasm  Abnormal posture  Malignant neoplasm of upper-outer quadrant of left breast in female, estrogen receptor positive ([place])  Rationale for Evaluation and Treatment: Rehabilitation  ONSET DATE: 06/19/22  SUBJECTIVE:  SUBJECTIVE STATEMENT:  Nothing new  PERTINENT HISTORY:  Patient was diagnosed on 06/19/22 with left grade 2. It measures 1.4 x1.3 x 1.1 cm and is located in the upper outer quadrant. It is ER+, PR-, HER2-  with a Ki67 of 85%. Plan is to undergo a L breast lumpectomy and SLNB on 07/08/22. 07/08/22- L breast lumpectomy and SLNB (0/3)  PATIENT GOALS:  Reassess how my recovery is going related to arm function, pain, and swelling.  PAIN:  Are you having pain? None  PRECAUTIONS: Recent Surgery, left UE Lymphedema risk,   ACTIVITY LEVEL / LEISURE: has not been able to do any exercise secondary to development of seroma and pain   OBJECTIVE:  OBSERVATIONS: L lumpectomy wound is healing well. There is a large palpable seroma possibly two in this area.   POSTURE:  Forward head and rounded shoulders posture   UPPER EXTREMITY AROM/PROM:   A/PROM RIGHT   eval    Shoulder extension 65  Shoulder flexion 174   Shoulder abduction 174  Shoulder internal rotation 47  Shoulder external rotation 80                          (Blank rows = not tested)   A/PROM LEFT   eval LEFT 08/05/22   Shoulder extension 50 46   Shoulder flexion 169 149   Shoulder abduction 175 129   Shoulder internal rotation 69 71   Shoulder external rotation 89 92                           (Blank rows = not tested)   CERVICAL AROM: All within normal limits:      Percent limited  Flexion WFL  Extension WFL  Right lateral flexion WFL  Left lateral flexion WFL  Right rotation WFL  Left rotation WFL      UPPER EXTREMITY STRENGTH: 5/5   LYMPHEDEMA ASSESSMENTS:    LANDMARK RIGHT   eval  10 cm proximal to olecranon process 40  Olecranon process 30.1  10 cm proximal to ulnar styloid process 25.5  Just proximal to ulnar styloid process 17.5  Across hand at thumb web space 19  At base of 2nd digit 6.4  (Blank rows = not tested)   LANDMARK LEFT   eval LEFT 08/05/22  10 cm proximal to olecranon process 38.5 38.5  Olecranon process 30 30  10  cm proximal to ulnar styloid process 24.9 25.1  Just proximal to ulnar styloid process 16 16  Across hand at thumb web space 19.5 19  At base of 2nd digit 6.4 6.3  (Blank rows = not tested)  Surgery type/Date: 07/08/22 L breast lumpectomy and SLNB Number of lymph nodes removed: 0/3 Current/past treatment (chemo, radiation, hormone therapy): will begin chemo on 08/07/22, 4 treatments once every 3 weeks (12 wks) then will begin radiation for 4 weeks, then will need hormone therapy  PATIENT EDUCATION:  Education details: how compression helps a seroma heal, need for ROM exercises Person educated: Patient Education method: Explanation Education comprehension: verbalized understanding  TREATMENT PERFORMED: Pt permission and consent throughout each step of examination and treatment with modification and draping if requested when working on sensitive areas  08/20/22: In supine: Short  neck, 5 diaphragmatic breaths, R axillary nodes and establishment of interaxillary pathway, L inguinal nodes and establishment of axilloinguinal pathway, then L breast with focus at area of seroma moving fluid towards pathways spending  extra time in any areas of fibrosis then retracing all steps. Throughout MLD spent time doing gentle STM to area of seroma to help soften and seroma was more difficult to palpate by end of session.  Then pt performed each step of steps per below omitting full breast MLD and focusing on seroma location only - educated to work the seroma towards the nearest pathway almost like the spokes of a wheel.  Added STM to the Lt UT as pt reports she has been having increased pain here today.   08/19/22: In supine: Short neck, 5 diaphragmatic breaths, R axillary nodes and establishment of interaxillary pathway, L inguinal nodes and establishment of axilloinguinal pathway, then L breast with focus at area of seroma moving fluid towards pathways spending extra time in any areas of fibrosis then retracing all steps. Throughout MLD spent time doing gentle STM to area of seroma to help soften and seroma was more difficult to palpate by end of session.   08/12/22: Checked seroma which is hard to tell if it is fluid filled but pt feels like it is filled back up so she will contact Dr. Barry Dienes about a third aspiration.  Discussed how we wouldn't do MLD over the seroma if it is filling back up.  PROM performed, MLD supraclavicular region, axilla, upper arm.  PROM is pretty much full without restriction today.  Discussed chemo side effects, etc with pt a bit anxious about upcoming treatments.    08/05/22: Created 1/2 inch foam pad and placed in stockinette for pt to wear in her compression bra on top of the lumpectomy scar to avoid any irritation. Measured pt for a prairie hugger compression bra and issued her info to obtain.   HOME EXERCISE PROGRAM: Reviewed previously given post op HEP. Obtain  compression bra Self MLD  ASSESSMENT:  CLINICAL IMPRESSION: Started MLD education with focus on seroma vs whole breast edema.  Pt did well with steps and seroma seemed smaller than when palpated last by this PT it is deep to the incision at this point.    GOALS: Goals reviewed with patient? Yes  LONG TERM GOALS:  (STG=LTG)  GOALS Name Target Date  Goal status  1 Pt will demonstrate she has regained full shoulder ROM and function post operatively compared to baselines.  Baseline: 09/02/22 NEW  2 Pt will obtain an appropriate compression bra for long term management of lymphedema. 09/02/22 NEW  3 Pt will be independent in self MLD for long term management of lymphedema. 09/02/22 NEW  4 Pt will be independent in a home exercise program for continued stretching and strengthening.  09/02/22 NEW     PLAN:  PT FREQUENCY/DURATION: 2x/wk for 4 wks  PLAN FOR NEXT SESSION: MLD and STM to seroma, measure AROM, signed up for ABC class in April   Manual Lymph Drainage for Left Breast.   Do daily.  Do slowly. Use flat hands with just enough pressure to stretch the skin. Do not slide over the skin, stretch the skin with the hand. (Stretch  Relax  Move) Lie down or sit comfortably (in a recliner, for example) to do this.   Do circles at each collar bone near the neck 5-7 times (to "wake up" lots of lymph nodes in this area).  Take slow deep breaths, allowing your belly to balloon out as you breathe in, 5x (to "wake up" abdominal lymph nodes).  Do circles in both armpits--stretch skin in small circles to stimulate intact lymph nodes there, 5-7x.  Do Circles in the left groin area, at panty line--stretch skin to stimulate lymph nodes 5-7x.  Redirect fluid from left chest toward right armpit (stretch skin starting at left chest in 3-4 spots working toward right armpit) 3-4x across the chest.  Redirect fluid from left armpit toward left groin (cup your hand around the curve of your left side and  do 3-4 "pumps" from armpit to groin) 3-4x down your side.  Work seroma towards nearest pathway - almost like a clock or spokes of a wheel  Then repeat your pathways 2-3x  (#5 and #6)  End with repeating #3 and #4 above  (circles in both armpits and the left groin) Cristiano Capri, Adrian Prince, PT 08/20/2022, 3:53 PM

## 2022-08-21 ENCOUNTER — Telehealth: Payer: Self-pay | Admitting: *Deleted

## 2022-08-21 ENCOUNTER — Other Ambulatory Visit: Payer: Self-pay | Admitting: *Deleted

## 2022-08-21 MED ORDER — NYSTATIN 100000 UNIT/ML MT SUSP: 5.0000 mL | Freq: Four times a day (QID) | OROMUCOSAL | 0 refills | Status: DC

## 2022-08-21 MED ORDER — FLUCONAZOLE 100 MG PO TABS: 100.0000 mg | ORAL_TABLET | Freq: Every day | ORAL | 2 refills | Status: DC

## 2022-08-21 NOTE — Telephone Encounter (Signed)
Message received from patient stating that she would like a call back regarding side effects that she is having from her chemotherapy.  Call placed back to patient and patient states that her tongue is coated white, that she has a sore throat and pain with swallowing and vaginal itching.  Dr. Marin Olp notified.  Call placed back to patient and patient notified per order of Dr. Marin Olp that  a prescription for Diflucan and Nystatin swish and swallow will be sent in to her pharmacy.  Teach back done.  Holly Leach is appreciative of call back and has no further questions at this time.

## 2022-08-24 ENCOUNTER — Encounter: Payer: Self-pay | Admitting: Hematology & Oncology

## 2022-08-26 ENCOUNTER — Encounter: Payer: Self-pay | Admitting: Rehabilitation

## 2022-08-26 ENCOUNTER — Ambulatory Visit: Payer: BC Managed Care – PPO | Admitting: Rehabilitation

## 2022-08-26 DIAGNOSIS — Z170 Estrogen receptor positive status [ER+]: Secondary | ICD-10-CM

## 2022-08-26 DIAGNOSIS — R600 Localized edema: Secondary | ICD-10-CM

## 2022-08-26 DIAGNOSIS — M25612 Stiffness of left shoulder, not elsewhere classified: Secondary | ICD-10-CM

## 2022-08-26 DIAGNOSIS — R293 Abnormal posture: Secondary | ICD-10-CM

## 2022-08-26 DIAGNOSIS — C50412 Malignant neoplasm of upper-outer quadrant of left female breast: Secondary | ICD-10-CM

## 2022-08-26 DIAGNOSIS — Z483 Aftercare following surgery for neoplasm: Secondary | ICD-10-CM

## 2022-08-26 NOTE — Therapy (Signed)
OUTPATIENT PHYSICAL THERAPY BREAST CANCER TREATMENT   Patient Name: MONTIA PRUE MRN: [id number] DOB:[birth year], 52 y.o., female Today's Date: 08/26/2022  END OF SESSION:  PT End of Session - 08/26/22 1657     Visit Number 6    Number of Visits 10    Date for PT Re-Evaluation 09/02/22    PT Start Time 1602    PT Stop Time [id number]    PT Time Calculation (min) 49 min    Activity Tolerance Patient tolerated treatment well    Behavior During Therapy WFL for tasks assessed/performed               Past Medical History:  Diagnosis Date   ACL tear    right knee   Cancer ([place])    Chronic headaches    Diabetes mellitus without complication ([place])    GERD (gastroesophageal reflux disease)    d/t trulicity   Hyperlipidemia    Hypertension    Hypothyroidism    OSA on CPAP    PE (pulmonary embolism) 2006   Pre-diabetes    Stage II breast cancer, left ([place]) 07/27/2022   Thyroid disease    Tuberculosis    as a child 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   Past Surgical History:  Procedure Laterality Date   BREAST BIOPSY Left 06/19/2022   [place] LT BREAST BX W LOC DEV 1ST LESION IMG BX SPEC US GUIDE 06/19/2022 [facility]   BREAST BIOPSY  07/07/2022   MM LT RADIOACTIVE SEED LOC MAMMO GUIDE 07/07/2022 [facility]   BREAST LUMPECTOMY WITH RADIOACTIVE SEED AND SENTINEL LYMPH NODE BIOPSY Left 07/08/2022   Procedure: LEFT BREAST LUMPECTOMY WITH RADIOACTIVE SEED AND SENTINEL LYMPH NODE BIOPSY;  Surgeon: Stark Klein, MD;  Location: [facility];  Service: General;  Laterality: Left;   COLONOSCOPY     COLPOSCOPY  2006   FINE NEEDLE ASPIRATION Left 07/28/2022   Procedure: ASPIRATION OF LEFT BREAST SEROMA;  Surgeon: Stark Klein, MD;  Location: [facility];  Service: General;  Laterality: Left;   FRACTURE SURGERY Left 2020   foot - pt denies having surgery for this   [place] Left 07/28/2022   Procedure: INSERTION PORT-A-CATH WITH ULTRASOUND  GUIDANCE;  Surgeon: Stark Klein, MD;  Location: [facility];  Service: General;  Laterality: Left;   Patient Active Problem List   Diagnosis Date Noted   Stage II breast cancer, left ([place]) 07/27/2022   Neck mass 11/03/2013   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/03/2013    PCP: Horald Pollen, MD  REFERRING PROVIDER: Stark Klein, MD  REFERRING DIAG: [phone] (ICD-10-CM) - Malignant neoplasm of upper-outer quadrant of left breast in female, estrogen receptor positive ([place])   THERAPY DIAG:  Localized edema  Stiffness of left shoulder, not elsewhere classified  Aftercare following surgery for neoplasm  Abnormal posture  Malignant neoplasm of upper-outer quadrant of left breast in female, estrogen receptor positive ([place])  Rationale for Evaluation and Treatment: Rehabilitation  ONSET DATE: 06/19/22  SUBJECTIVE:  SUBJECTIVE STATEMENT:  She drained at least 45mm last time and thinks the firmess is scar tissue.   PERTINENT HISTORY:  Patient was diagnosed on 06/19/22 with left grade 2. It measures 1.4 x1.3 x 1.1 cm and is located in the upper outer quadrant. It is ER+, PR-, HER2-  with a Ki67 of 85%. Plan is to undergo a L breast lumpectomy and SLNB on 07/08/22. 07/08/22- L breast lumpectomy and SLNB (0/3)  PATIENT GOALS:  Reassess how my recovery is going related to arm function, pain, and swelling.  PAIN:  Are you having pain? None  PRECAUTIONS: Recent Surgery, left UE Lymphedema risk,   ACTIVITY LEVEL / LEISURE: has not been able to do any exercise secondary to development of seroma and pain   OBJECTIVE:  OBSERVATIONS: L lumpectomy wound is healing well. There is a large palpable seroma possibly two in this area.   POSTURE:  Forward head and rounded shoulders posture   UPPER EXTREMITY AROM/PROM:   A/PROM RIGHT    eval    Shoulder extension 65  Shoulder flexion 174  Shoulder abduction 174  Shoulder internal rotation 47  Shoulder external rotation 80                          (Blank rows = not tested)   A/PROM LEFT   eval LEFT 08/05/22   Shoulder extension 50 46   Shoulder flexion 169 149   Shoulder abduction 175 129   Shoulder internal rotation 69 71   Shoulder external rotation 89 92                           (Blank rows = not tested)   CERVICAL AROM: All within normal limits:      Percent limited  Flexion WFL  Extension WFL  Right lateral flexion WFL  Left lateral flexion WFL  Right rotation WFL  Left rotation WFL      UPPER EXTREMITY STRENGTH: 5/5   LYMPHEDEMA ASSESSMENTS:    LANDMARK RIGHT   eval  10 cm proximal to olecranon process 40  Olecranon process 30.1  10 cm proximal to ulnar styloid process 25.5  Just proximal to ulnar styloid process 17.5  Across hand at thumb web space 19  At base of 2nd digit 6.4  (Blank rows = not tested)   LANDMARK LEFT   eval LEFT 08/05/22  10 cm proximal to olecranon process 38.5 38.5  Olecranon process 30 30  10  cm proximal to ulnar styloid process 24.9 25.1  Just proximal to ulnar styloid process 16 16  Across hand at thumb web space 19.5 19  At base of 2nd digit 6.4 6.3  (Blank rows = not tested)  Surgery type/Date: 07/08/22 L breast lumpectomy and SLNB Number of lymph nodes removed: 0/3 Current/past treatment (chemo, radiation, hormone therapy): will begin chemo on 08/07/22, 4 treatments once every 3 weeks (12 wks) then will begin radiation for 4 weeks, then will need hormone therapy  PATIENT EDUCATION:  Education details: how compression helps a seroma heal, need for ROM exercises Person educated: Patient Education method: Explanation Education comprehension: verbalized understanding  TREATMENT PERFORMED: Pt permission and consent throughout each step of examination and treatment with modification and draping if requested when  working on sensitive areas  08/20/22: In supine: Short neck, 5 diaphragmatic breaths, R axillary nodes and establishment of interaxillary pathway, L inguinal nodes and establishment of axilloinguinal pathway, then  L breast with focus at area of seroma moving fluid towards pathways spending extra time in any areas of fibrosis then retracing all steps. Throughout MLD spent time doing gentle STM to area of seroma to help soften and seroma.  Then pt performed each step of steps in seated per below omitting full breast MLD and focusing on seroma location only - educated to work the seroma towards the nearest pathway almost like the spokes of a wheel.  Pt noted some pull in the axilla with overhead flexion PROM so we added some arm propped overhead STM to the axilla - cording possible mid axilla but very deep and could also just be tightness.   Updated HEP to include more stretches and performed each x 1 for education.   08/19/22: In supine: Short neck, 5 diaphragmatic breaths, R axillary nodes and establishment of interaxillary pathway, L inguinal nodes and establishment of axilloinguinal pathway, then L breast with focus at area of seroma moving fluid towards pathways spending extra time in any areas of fibrosis then retracing all steps. Throughout MLD spent time doing gentle STM to area of seroma to help soften and seroma was more difficult to palpate by end of session.   08/12/22: Checked seroma which is hard to tell if it is fluid filled but pt feels like it is filled back up so she will contact Dr. Barry Dienes about a third aspiration.  Discussed how we wouldn't do MLD over the seroma if it is filling back up.  PROM performed, MLD supraclavicular region, axilla, upper arm.  PROM is pretty much full without restriction today.  Discussed chemo side effects, etc with pt a bit anxious about upcoming treatments.    08/05/22: Created 1/2 inch foam pad and placed in stockinette for pt to wear in her compression bra on  top of the lumpectomy scar to avoid any irritation. Measured pt for a prairie hugger compression bra and issued her info to obtain.   HOME EXERCISE PROGRAM: Reviewed previously given post op HEP. Obtain compression bra Self MLD Access Code: [email or web address] URL: [email or web address] Date: 08/26/2022 Prepared by: Shan Levans  Exercises - Standing Shoulder Flexion Wall Walk  - 1-2 x daily - 7 x weekly - 1 sets - 3 reps - 20-30 seconds hold - Single Arm Doorway Pec Stretch at 90 Degrees Abduction  - 1-2 x daily - 7 x weekly - 1 sets - 3 reps - 20-30 second hold - Supine Chest Stretch with Elbows Bent  - 1-2 x daily - 7 x weekly - 1 sets - 3 reps - 30-60seconds hold  ASSESSMENT:  CLINICAL IMPRESSION: Added more stretching, PROM, STM, and updated HEP due to pt feeling more pull in the axilla today with reach.  Seroma feels mostly unchanged in size but a bit softer in seated.  Pt needed cueing to decrease use of fingertips only and to decrease inward pressure with MLD.    GOALS: Goals reviewed with patient? Yes  LONG TERM GOALS:  (STG=LTG)  GOALS Name Target Date  Goal status  1 Pt will demonstrate she has regained full shoulder ROM and function post operatively compared to baselines.  Baseline: 09/02/22 NEW  2 Pt will obtain an appropriate compression bra for long term management of lymphedema. 09/02/22 NEW  3 Pt will be independent in self MLD for long term management of lymphedema. 09/02/22 NEW  4 Pt will be independent in a home exercise program for continued stretching and strengthening.  09/02/22 NEW  PLAN:  PT FREQUENCY/DURATION: 2x/wk for 4 wks  PLAN FOR NEXT SESSION: MLD and STM to seroma, measure AROM, signed up for ABC class in April   Manual Lymph Drainage for Left Breast.   Do daily.  Do slowly. Use flat hands with just enough pressure to stretch the skin. Do not slide over the skin, stretch the skin with the hand. (Stretch  Relax  Move) Lie down or sit  comfortably (in a recliner, for example) to do this.   Do circles at each collar bone near the neck 5-7 times (to "wake up" lots of lymph nodes in this area).  Take slow deep breaths, allowing your belly to balloon out as you breathe in, 5x (to "wake up" abdominal lymph nodes).  Do circles in both armpits--stretch skin in small circles to stimulate intact lymph nodes there, 5-7x.  Do Circles in the left groin area, at panty line--stretch skin to stimulate lymph nodes 5-7x.  Redirect fluid from left chest toward right armpit (stretch skin starting at left chest in 3-4 spots working toward right armpit) 3-4x across the chest.  Redirect fluid from left armpit toward left groin (cup your hand around the curve of your left side and do 3-4 "pumps" from armpit to groin) 3-4x down your side.  Work seroma towards nearest pathway - almost like a clock or spokes of a wheel  Then repeat your pathways 2-3x  (#5 and #6)  End with repeating #3 and #4 above  (circles in both armpits and the left groin) Sollie Vultaggio, Adrian Prince, PT 08/26/2022, 4:58 PM

## 2022-08-27 ENCOUNTER — Encounter: Payer: Self-pay | Admitting: Rehabilitation

## 2022-08-27 ENCOUNTER — Ambulatory Visit: Payer: BC Managed Care – PPO | Admitting: Rehabilitation

## 2022-08-27 DIAGNOSIS — Z170 Estrogen receptor positive status [ER+]: Secondary | ICD-10-CM

## 2022-08-27 DIAGNOSIS — R600 Localized edema: Secondary | ICD-10-CM

## 2022-08-27 DIAGNOSIS — M25612 Stiffness of left shoulder, not elsewhere classified: Secondary | ICD-10-CM

## 2022-08-27 DIAGNOSIS — Z483 Aftercare following surgery for neoplasm: Secondary | ICD-10-CM

## 2022-08-27 DIAGNOSIS — R293 Abnormal posture: Secondary | ICD-10-CM

## 2022-08-27 NOTE — Therapy (Signed)
OUTPATIENT PHYSICAL THERAPY BREAST CANCER TREATMENT   Patient Name: Holly Leach MRN: [id number] DOB:23-Aug-1970, 52 y.o., female Today's Date: 08/27/2022  END OF SESSION:  PT End of Session - 08/27/22 1500     Visit Number 7    Number of Visits 10    Date for PT Re-Evaluation 09/02/22    PT Start Time 1500    PT Stop Time [id number]    PT Time Calculation (min) 49 min    Activity Tolerance Patient tolerated treatment well    Behavior During Therapy WFL for tasks assessed/performed               Past Medical History:  Diagnosis Date   ACL tear    right knee   Cancer ([place])    Chronic headaches    Diabetes mellitus without complication ([place])    GERD (gastroesophageal reflux disease)    d/t trulicity   Hyperlipidemia    Hypertension    Hypothyroidism    OSA on CPAP    PE (pulmonary embolism) 2006   Pre-diabetes    Stage II breast cancer, left ([place]) 07/27/2022   Thyroid disease    Tuberculosis    as a child 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   Past Surgical History:  Procedure Laterality Date   BREAST BIOPSY Left 06/19/2022   [place] LT BREAST BX W LOC DEV 1ST LESION IMG BX SPEC US GUIDE 06/19/2022 [facility]   BREAST BIOPSY  07/07/2022   MM LT RADIOACTIVE SEED LOC MAMMO GUIDE 07/07/2022 [facility]   BREAST LUMPECTOMY WITH RADIOACTIVE SEED AND SENTINEL LYMPH NODE BIOPSY Left 07/08/2022   Procedure: LEFT BREAST LUMPECTOMY WITH RADIOACTIVE SEED AND SENTINEL LYMPH NODE BIOPSY;  Surgeon: Stark Klein, MD;  Location: [facility];  Service: General;  Laterality: Left;   COLONOSCOPY     COLPOSCOPY  2006   FINE NEEDLE ASPIRATION Left 07/28/2022   Procedure: ASPIRATION OF LEFT BREAST SEROMA;  Surgeon: Stark Klein, MD;  Location: [facility];  Service: General;  Laterality: Left;   FRACTURE SURGERY Left 2020   foot - pt denies having surgery for this   [place] Left 07/28/2022   Procedure: INSERTION PORT-A-CATH WITH ULTRASOUND  GUIDANCE;  Surgeon: Stark Klein, MD;  Location: [facility];  Service: General;  Laterality: Left;   Patient Active Problem List   Diagnosis Date Noted   Stage II breast cancer, left ([place]) 07/27/2022   Neck mass 11/03/2013   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/03/2013    PCP: Horald Pollen, MD  REFERRING PROVIDER: Stark Klein, MD  REFERRING DIAG: [id number] (ICD-10-CM) - Malignant neoplasm of upper-outer quadrant of left breast in female, estrogen receptor positive ([place])   THERAPY DIAG:  Localized edema  Stiffness of left shoulder, not elsewhere classified  Aftercare following surgery for neoplasm  Abnormal posture  Malignant neoplasm of upper-outer quadrant of left breast in female, estrogen receptor positive ([place])  Rationale for Evaluation and Treatment: Rehabilitation  ONSET DATE: 06/19/22  SUBJECTIVE:  SUBJECTIVE STATEMENT:   I didn't really feel much of that tightness.   PERTINENT HISTORY:  Patient was diagnosed on 06/19/22 with left grade 2. It measures 1.4 x1.3 x 1.1 cm and is located in the upper outer quadrant. It is ER+, PR-, HER2-  with a Ki67 of 85%. Plan is to undergo a L breast lumpectomy and SLNB on 07/08/22. 07/08/22- L breast lumpectomy and SLNB (0/3)  PATIENT GOALS:  Reassess how my recovery is going related to arm function, pain, and swelling.  PAIN:  Are you having pain? None  PRECAUTIONS: Recent Surgery, left UE Lymphedema risk,   ACTIVITY LEVEL / LEISURE: has not been able to do any exercise secondary to development of seroma and pain   OBJECTIVE:  OBSERVATIONS: L lumpectomy wound is healing well. There is a large palpable seroma possibly two in this area.   POSTURE:  Forward head and rounded shoulders posture   UPPER EXTREMITY AROM/PROM:   A/PROM RIGHT   eval    Shoulder  extension 65  Shoulder flexion 174  Shoulder abduction 174  Shoulder internal rotation 47  Shoulder external rotation 80                          (Blank rows = not tested)   A/PROM LEFT   eval LEFT 08/05/22   Shoulder extension 50 46   Shoulder flexion 169 149   Shoulder abduction 175 129   Shoulder internal rotation 69 71   Shoulder external rotation 89 92                           (Blank rows = not tested)   CERVICAL AROM: All within normal limits:      Percent limited  Flexion WFL  Extension WFL  Right lateral flexion WFL  Left lateral flexion WFL  Right rotation WFL  Left rotation WFL      UPPER EXTREMITY STRENGTH: 5/5   LYMPHEDEMA ASSESSMENTS:    LANDMARK RIGHT   eval  10 cm proximal to olecranon process 40  Olecranon process 30.1  10 cm proximal to ulnar styloid process 25.5  Just proximal to ulnar styloid process 17.5  Across hand at thumb web space 19  At base of 2nd digit 6.4  (Blank rows = not tested)   LANDMARK LEFT   eval LEFT 08/05/22  10 cm proximal to olecranon process 38.5 38.5  Olecranon process 30 30  10  cm proximal to ulnar styloid process 24.9 25.1  Just proximal to ulnar styloid process 16 16  Across hand at thumb web space 19.5 19  At base of 2nd digit 6.4 6.3  (Blank rows = not tested)  Surgery type/Date: 07/08/22 L breast lumpectomy and SLNB Number of lymph nodes removed: 0/3 Current/past treatment (chemo, radiation, hormone therapy): will begin chemo on 08/07/22, 4 treatments once every 3 weeks (12 wks) then will begin radiation for 4 weeks, then will need hormone therapy  PATIENT EDUCATION:  Education details: how compression helps a seroma heal, need for ROM exercises Person educated: Patient Education method: Explanation Education comprehension: verbalized understanding  TREATMENT PERFORMED: Pt permission and consent throughout each step of examination and treatment with modification and draping if requested when working on sensitive  areas  08/27/22: Pulleys into flexion and abduction with initial cueing 51min each Wall ball flexion x 5 holding 5" each  Single arm doorway stretch 3x15" Horizontal abduction yellow x 5  Bil ER yellow x 5  Bil diagonal x 5  STM to area of seroma to help soften and seroma.   PROM into flexion with pinning and education on self flexion with pinning at home propped overhead STM to the axilla - cording possible mid axilla but very deep and could also just be tightness.    08/26/22: In supine: Short neck, 5 diaphragmatic breaths, R axillary nodes and establishment of interaxillary pathway, L inguinal nodes and establishment of axilloinguinal pathway, then L breast with focus at area of seroma moving fluid towards pathways spending extra time in any areas of fibrosis then retracing all steps. Throughout MLD spent time doing gentle STM to area of seroma to help soften and seroma.  Then pt performed each step of steps in seated per below omitting full breast MLD and focusing on seroma location only - educated to work the seroma towards the nearest pathway almost like the spokes of a wheel.  Pt noted some pull in the axilla with overhead flexion PROM so we added some arm propped overhead STM to the axilla - cording possible mid axilla but very deep and could also just be tightness.   Updated HEP to include more stretches and performed each x 1 for education.   08/19/22: In supine: Short neck, 5 diaphragmatic breaths, R axillary nodes and establishment of interaxillary pathway, L inguinal nodes and establishment of axilloinguinal pathway, then L breast with focus at area of seroma moving fluid towards pathways spending extra time in any areas of fibrosis then retracing all steps. Throughout MLD spent time doing gentle STM to area of seroma to help soften and seroma was more difficult to palpate by end of session.   08/12/22: Checked seroma which is hard to tell if it is fluid filled but pt feels like it is  filled back up so she will contact Dr. Barry Dienes about a third aspiration.  Discussed how we wouldn't do MLD over the seroma if it is filling back up.  PROM performed, MLD supraclavicular region, axilla, upper arm.  PROM is pretty much full without restriction today.  Discussed chemo side effects, etc with pt a bit anxious about upcoming treatments.    08/05/22: Created 1/2 inch foam pad and placed in stockinette for pt to wear in her compression bra on top of the lumpectomy scar to avoid any irritation. Measured pt for a prairie hugger compression bra and issued her info to obtain.   HOME EXERCISE PROGRAM: Reviewed previously given post op HEP. Obtain compression bra Self MLD Access Code: [email or web address] URL: [email or web address] Date: 08/26/2022 Prepared by: Shan Levans  Exercises - Standing Shoulder Flexion Wall Walk  - 1-2 x daily - 7 x weekly - 1 sets - 3 reps - 20-30 seconds hold - Single Arm Doorway Pec Stretch at 90 Degrees Abduction  - 1-2 x daily - 7 x weekly - 1 sets - 3 reps - 20-30 second hold - Supine Chest Stretch with Elbows Bent  - 1-2 x daily - 7 x weekly - 1 sets - 3 reps - 30-60seconds hold  ASSESSMENT:  CLINICAL IMPRESSION: Added more stretching, PROM, STM, and updated HEP due to pt feeling more pull in the axilla today with reach.  Seroma feels mostly unchanged in size but a bit softer in seated.  Pt needed cueing to decrease use of fingertips only and to decrease inward pressure with MLD.    GOALS: Goals reviewed with patient? Yes  LONG TERM GOALS:  (STG=LTG)  GOALS  Name Target Date  Goal status  1 Pt will demonstrate she has regained full shoulder ROM and function post operatively compared to baselines.  Baseline: 09/02/22 NEW  2 Pt will obtain an appropriate compression bra for long term management of lymphedema. 09/02/22 NEW  3 Pt will be independent in self MLD for long term management of lymphedema. 09/02/22 NEW  4 Pt will be independent in a home exercise  program for continued stretching and strengthening.  09/02/22 NEW     PLAN:  PT FREQUENCY/DURATION: 2x/wk for 4 wks  PLAN FOR NEXT SESSION: 1 more visit - focus on HEP   Manual Lymph Drainage for Left Breast.   Do daily.  Do slowly. Use flat hands with just enough pressure to stretch the skin. Do not slide over the skin, stretch the skin with the hand. (Stretch  Relax  Move) Lie down or sit comfortably (in a recliner, for example) to do this.   Do circles at each collar bone near the neck 5-7 times (to "wake up" lots of lymph nodes in this area).  Take slow deep breaths, allowing your belly to balloon out as you breathe in, 5x (to "wake up" abdominal lymph nodes).  Do circles in both armpits--stretch skin in small circles to stimulate intact lymph nodes there, 5-7x.  Do Circles in the left groin area, at panty line--stretch skin to stimulate lymph nodes 5-7x.  Redirect fluid from left chest toward right armpit (stretch skin starting at left chest in 3-4 spots working toward right armpit) 3-4x across the chest.  Redirect fluid from left armpit toward left groin (cup your hand around the curve of your left side and do 3-4 "pumps" from armpit to groin) 3-4x down your side.  Work seroma towards nearest pathway - almost like a clock or spokes of a wheel  Then repeat your pathways 2-3x  (#5 and #6)  End with repeating #3 and #4 above  (circles in both armpits and the left groin) Stark Bray, PT 08/27/2022, 3:49 PM

## 2022-08-28 ENCOUNTER — Inpatient Hospital Stay: Payer: BC Managed Care – PPO

## 2022-08-28 ENCOUNTER — Other Ambulatory Visit: Payer: BC Managed Care – PPO

## 2022-08-28 ENCOUNTER — Ambulatory Visit: Payer: BC Managed Care – PPO | Admitting: Hematology & Oncology

## 2022-08-28 ENCOUNTER — Ambulatory Visit: Payer: BC Managed Care – PPO

## 2022-08-31 ENCOUNTER — Ambulatory Visit: Payer: BC Managed Care – PPO

## 2022-09-01 ENCOUNTER — Other Ambulatory Visit: Payer: Self-pay

## 2022-09-02 ENCOUNTER — Ambulatory Visit: Payer: BC Managed Care – PPO | Attending: General Surgery | Admitting: Rehabilitation

## 2022-09-02 ENCOUNTER — Other Ambulatory Visit: Payer: Self-pay | Admitting: *Deleted

## 2022-09-02 DIAGNOSIS — Z170 Estrogen receptor positive status [ER+]: Secondary | ICD-10-CM | POA: Insufficient documentation

## 2022-09-02 DIAGNOSIS — R293 Abnormal posture: Secondary | ICD-10-CM

## 2022-09-02 DIAGNOSIS — C50912 Malignant neoplasm of unspecified site of left female breast: Secondary | ICD-10-CM

## 2022-09-02 DIAGNOSIS — M25612 Stiffness of left shoulder, not elsewhere classified: Secondary | ICD-10-CM

## 2022-09-02 DIAGNOSIS — C50412 Malignant neoplasm of upper-outer quadrant of left female breast: Secondary | ICD-10-CM | POA: Diagnosis present

## 2022-09-02 DIAGNOSIS — R600 Localized edema: Secondary | ICD-10-CM

## 2022-09-02 DIAGNOSIS — Z483 Aftercare following surgery for neoplasm: Secondary | ICD-10-CM

## 2022-09-02 NOTE — Therapy (Signed)
OUTPATIENT PHYSICAL THERAPY BREAST CANCER TREATMENT   Patient Name: Holly Leach MRN: [id number] DOB:10/13/1970, 52 y.o., female Today's Date: 09/02/2022  END OF SESSION:  PT End of Session - 09/02/22 1710     Visit Number 8    Number of Visits 10    PT Start Time 1500    PT Stop Time 1540    PT Time Calculation (min) 40 min    Activity Tolerance Patient tolerated treatment well    Behavior During Therapy WFL for tasks assessed/performed                Past Medical History:  Diagnosis Date   ACL tear    right knee   Cancer ([place])    Chronic headaches    Diabetes mellitus without complication ([place])    GERD (gastroesophageal reflux disease)    d/t trulicity   Hyperlipidemia    Hypertension    Hypothyroidism    OSA on CPAP    PE (pulmonary embolism) 2006   Pre-diabetes    Stage II breast cancer, left ([place]) 07/27/2022   Thyroid disease    Tuberculosis    as a child 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   Past Surgical History:  Procedure Laterality Date   BREAST BIOPSY Left 06/19/2022   [place] LT BREAST BX W LOC DEV 1ST LESION IMG BX SPEC US GUIDE 06/19/2022 [facility]   BREAST BIOPSY  07/07/2022   MM LT RADIOACTIVE SEED LOC MAMMO GUIDE 07/07/2022 [facility]   BREAST LUMPECTOMY WITH RADIOACTIVE SEED AND SENTINEL LYMPH NODE BIOPSY Left 07/08/2022   Procedure: LEFT BREAST LUMPECTOMY WITH RADIOACTIVE SEED AND SENTINEL LYMPH NODE BIOPSY;  Surgeon: Stark Klein, MD;  Location: [facility];  Service: General;  Laterality: Left;   COLONOSCOPY     COLPOSCOPY  2006   FINE NEEDLE ASPIRATION Left 07/28/2022   Procedure: ASPIRATION OF LEFT BREAST SEROMA;  Surgeon: Stark Klein, MD;  Location: [facility];  Service: General;  Laterality: Left;   FRACTURE SURGERY Left 2020   foot - pt denies having surgery for this   [place] Left 07/28/2022   Procedure: INSERTION PORT-A-CATH WITH ULTRASOUND GUIDANCE;  Surgeon: Stark Klein, MD;   Location: [facility];  Service: General;  Laterality: Left;   Patient Active Problem List   Diagnosis Date Noted   Stage II breast cancer, left 07/27/2022   Neck mass 11/03/2013   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/03/2013    PCP: Horald Pollen, MD  REFERRING PROVIDER: Stark Klein, MD  REFERRING DIAG: [id number] (ICD-10-CM) - Malignant neoplasm of upper-outer quadrant of left breast in female, estrogen receptor positive ([place])   THERAPY DIAG:  Localized edema  Aftercare following surgery for neoplasm  Stiffness of left shoulder, not elsewhere classified  Abnormal posture  Malignant neoplasm of upper-outer quadrant of left breast in female, estrogen receptor positive  Rationale for Evaluation and Treatment: Rehabilitation  ONSET DATE: 06/19/22  SUBJECTIVE:  SUBJECTIVE STATEMENT: I have been feeling good.  Infusion tomorrow.   PERTINENT HISTORY:  Patient was diagnosed on 06/19/22 with left grade 2. It measures 1.4 x1.3 x 1.1 cm and is located in the upper outer quadrant. It is ER+, PR-, HER2-  with a Ki67 of 85%. Plan is to undergo a L breast lumpectomy and SLNB on 07/08/22. 07/08/22- L breast lumpectomy and SLNB (0/3)  PATIENT GOALS:  Reassess how my recovery is going related to arm function, pain, and swelling.  PAIN:  Are you having pain? None  PRECAUTIONS: Recent Surgery, left UE Lymphedema risk,   ACTIVITY LEVEL / LEISURE: has not been able to do any exercise secondary to development of seroma and pain   OBJECTIVE:  OBSERVATIONS: L lumpectomy wound is healing well. There is a large palpable seroma possibly two in this area.   POSTURE:  Forward head and rounded shoulders posture   UPPER EXTREMITY AROM/PROM:   A/PROM RIGHT   eval    Shoulder extension 65  Shoulder flexion 174  Shoulder abduction 174   Shoulder internal rotation 47  Shoulder external rotation 80                          (Blank rows = not tested)   A/PROM LEFT   eval LEFT 08/05/22   Shoulder extension 50 46   Shoulder flexion 169 149   Shoulder abduction 175 129   Shoulder internal rotation 69 71   Shoulder external rotation 89 92                           (Blank rows = not tested)   CERVICAL AROM: All within normal limits:      Percent limited  Flexion WFL  Extension WFL  Right lateral flexion WFL  Left lateral flexion WFL  Right rotation WFL  Left rotation WFL      UPPER EXTREMITY STRENGTH: 5/5   LYMPHEDEMA ASSESSMENTS:    LANDMARK RIGHT   eval  10 cm proximal to olecranon process 40  Olecranon process 30.1  10 cm proximal to ulnar styloid process 25.5  Just proximal to ulnar styloid process 17.5  Across hand at thumb web space 19  At base of 2nd digit 6.4  (Blank rows = not tested)   LANDMARK LEFT   eval LEFT 08/05/22  10 cm proximal to olecranon process 38.5 38.5  Olecranon process 30 30  10  cm proximal to ulnar styloid process 24.9 25.1  Just proximal to ulnar styloid process 16 16  Across hand at thumb web space 19.5 19  At base of 2nd digit 6.4 6.3  (Blank rows = not tested)  Surgery type/Date: 07/08/22 L breast lumpectomy and SLNB Number of lymph nodes removed: 0/3 Current/past treatment (chemo, radiation, hormone therapy): will begin chemo on 08/07/22, 4 treatments once every 3 weeks (12 wks) then will begin radiation for 4 weeks, then will need hormone therapy  PATIENT EDUCATION:  Education details: how compression helps a seroma heal, need for ROM exercises Person educated: Patient Education method: Explanation Education comprehension: verbalized understanding  TREATMENT PERFORMED: Pt permission and consent throughout each step of examination and treatment with modification and draping if requested when working on sensitive areas  09/02/22: Pulleys into flexion and abduction with  initial cueing 49min each (pt reports easy) Wall ball flexion x 5 holding 5" each  Supine snow angel x 4  Horizontal abduction yellow  x 10 Bil ER yellow x 10 Bil diagonal x 10 STM to area of seroma to help soften and seroma.   propped overhead STM to the axilla  08/27/22: Pulleys into flexion and abduction with initial cueing 55min each Wall ball flexion x 5 holding 5" each  Supine snow angel x 4  Horizontal abduction yellow x 10 Bil ER yellow x 10 Bil diagonal x 10 STM to area of seroma to help soften and seroma.   propped overhead STM to the axilla - cording possible mid axilla but very deep and could also just be tightness.    08/26/22: In supine: Short neck, 5 diaphragmatic breaths, R axillary nodes and establishment of interaxillary pathway, L inguinal nodes and establishment of axilloinguinal pathway, then L breast with focus at area of seroma moving fluid towards pathways spending extra time in any areas of fibrosis then retracing all steps. Throughout MLD spent time doing gentle STM to area of seroma to help soften and seroma.  Then pt performed each step of steps in seated per below omitting full breast MLD and focusing on seroma location only - educated to work the seroma towards the nearest pathway almost like the spokes of a wheel.  Pt noted some pull in the axilla with overhead flexion PROM so we added some arm propped overhead STM to the axilla - cording possible mid axilla but very deep and could also just be tightness.   Updated HEP to include more stretches and performed each x 1 for education.   08/19/22: In supine: Short neck, 5 diaphragmatic breaths, R axillary nodes and establishment of interaxillary pathway, L inguinal nodes and establishment of axilloinguinal pathway, then L breast with focus at area of seroma moving fluid towards pathways spending extra time in any areas of fibrosis then retracing all steps. Throughout MLD spent time doing gentle STM to area of seroma to  help soften and seroma was more difficult to palpate by end of session.   08/12/22: Checked seroma which is hard to tell if it is fluid filled but pt feels like it is filled back up so she will contact Dr. Barry Dienes about a third aspiration.  Discussed how we wouldn't do MLD over the seroma if it is filling back up.  PROM performed, MLD supraclavicular region, axilla, upper arm.  PROM is pretty much full without restriction today.  Discussed chemo side effects, etc with pt a bit anxious about upcoming treatments.    08/05/22: Created 1/2 inch foam pad and placed in stockinette for pt to wear in her compression bra on top of the lumpectomy scar to avoid any irritation. Measured pt for a prairie hugger compression bra and issued her info to obtain.   HOME EXERCISE PROGRAM: Reviewed previously given post op HEP. Obtain compression bra Self MLD Access Code: [email or web address] URL: [email or web address] Date: 08/26/2022 Prepared by: Shan Levans  Exercises - Standing Shoulder Flexion Wall Walk  - 1-2 x daily - 7 x weekly - 1 sets - 3 reps - 20-30 seconds hold - Single Arm Doorway Pec Stretch at 90 Degrees Abduction  - 1-2 x daily - 7 x weekly - 1 sets - 3 reps - 20-30 second hold - Supine Chest Stretch with Elbows Bent  - 1-2 x daily - 7 x weekly - 1 sets - 3 reps - 30-60seconds hold Yellow band horizontal abdu Yellow band bil ER Yellow band D2 diagonals   ASSESSMENT:  CLINICAL IMPRESSION: Pt is ready for Dc today.  She is  no longer having an active seroma but now more scar tissue.  She is ind with her stretches and exercises and self breast MLD if needed.  She will continue [facility] surveillance.      GOALS: Goals reviewed with patient? Yes  LONG TERM GOALS:  (STG=LTG)  GOALS Name Target Date  Goal status  1 Pt will demonstrate she has regained full shoulder ROM and function post operatively compared to baselines.  Baseline: 09/02/22 MET  2 Pt will obtain an appropriate compression bra for  long term management of lymphedema. 09/02/22 MET  3 Pt will be independent in self MLD for long term management of lymphedema. 09/02/22 MET  4 Pt will be independent in a home exercise program for continued stretching and strengthening.  09/02/22 MET     PLAN:  PT FREQUENCY/DURATION: 2x/wk for 4 wks  PLAN FOR NEXT SESSION: 1 more visit - focus on HEP   Manual Lymph Drainage for Left Breast.   Do daily.  Do slowly. Use flat hands with just enough pressure to stretch the skin. Do not slide over the skin, stretch the skin with the hand. (Stretch  Relax  Move) Lie down or sit comfortably (in a recliner, for example) to do this.   Do circles at each collar bone near the neck 5-7 times (to "wake up" lots of lymph nodes in this area).  Take slow deep breaths, allowing your belly to balloon out as you breathe in, 5x (to "wake up" abdominal lymph nodes).  Do circles in both armpits--stretch skin in small circles to stimulate intact lymph nodes there, 5-7x.  Do Circles in the left groin area, at panty line--stretch skin to stimulate lymph nodes 5-7x.  Redirect fluid from left chest toward right armpit (stretch skin starting at left chest in 3-4 spots working toward right armpit) 3-4x across the chest.  Redirect fluid from left armpit toward left groin (cup your hand around the curve of your left side and do 3-4 "pumps" from armpit to groin) 3-4x down your side.  Work seroma towards nearest pathway - almost like a clock or spokes of a wheel  Then repeat your pathways 2-3x  (#5 and #6)  End with repeating #3 and #4 above  (circles in both armpits and the left groin) Feliciana Narayan, Adrian Prince, PT 09/02/2022, 5:10 PM   PHYSICAL THERAPY DISCHARGE SUMMARY  Visits from Start of Care: 8  Current functional level related to goals / functional outcomes: See above   Remaining deficits: Lymphedema risk   Education / Equipment: Final HEP  Plan: Patient agrees to discharge.  Patient is being discharged  due to meeting the stated rehab goals.     Shan Levans, PT

## 2022-09-03 ENCOUNTER — Encounter: Payer: BC Managed Care – PPO | Admitting: Rehabilitation

## 2022-09-03 ENCOUNTER — Inpatient Hospital Stay: Payer: BC Managed Care – PPO

## 2022-09-03 ENCOUNTER — Inpatient Hospital Stay: Payer: BC Managed Care – PPO | Attending: Hematology & Oncology

## 2022-09-03 ENCOUNTER — Encounter: Payer: Self-pay | Admitting: Hematology & Oncology

## 2022-09-03 ENCOUNTER — Other Ambulatory Visit: Payer: Self-pay

## 2022-09-03 ENCOUNTER — Encounter: Payer: Self-pay | Admitting: *Deleted

## 2022-09-03 ENCOUNTER — Inpatient Hospital Stay: Payer: BC Managed Care – PPO | Admitting: Hematology & Oncology

## 2022-09-03 VITALS — BP 133/55 | HR 101 | Temp 97.6°F | Resp 18 | Ht 63.0 in | Wt 216.0 lb

## 2022-09-03 VITALS — HR 71

## 2022-09-03 DIAGNOSIS — K5900 Constipation, unspecified: Secondary | ICD-10-CM | POA: Insufficient documentation

## 2022-09-03 DIAGNOSIS — C50912 Malignant neoplasm of unspecified site of left female breast: Secondary | ICD-10-CM

## 2022-09-03 DIAGNOSIS — Z79899 Other long term (current) drug therapy: Secondary | ICD-10-CM | POA: Diagnosis not present

## 2022-09-03 DIAGNOSIS — Z79890 Hormone replacement therapy (postmenopausal): Secondary | ICD-10-CM | POA: Diagnosis not present

## 2022-09-03 DIAGNOSIS — Z7985 Long-term (current) use of injectable non-insulin antidiabetic drugs: Secondary | ICD-10-CM | POA: Diagnosis not present

## 2022-09-03 DIAGNOSIS — K859 Acute pancreatitis without necrosis or infection, unspecified: Secondary | ICD-10-CM | POA: Diagnosis not present

## 2022-09-03 DIAGNOSIS — Z7952 Long term (current) use of systemic steroids: Secondary | ICD-10-CM | POA: Insufficient documentation

## 2022-09-03 DIAGNOSIS — Z5189 Encounter for other specified aftercare: Secondary | ICD-10-CM | POA: Insufficient documentation

## 2022-09-03 DIAGNOSIS — Z170 Estrogen receptor positive status [ER+]: Secondary | ICD-10-CM | POA: Insufficient documentation

## 2022-09-03 DIAGNOSIS — Z5112 Encounter for antineoplastic immunotherapy: Secondary | ICD-10-CM | POA: Diagnosis not present

## 2022-09-03 DIAGNOSIS — R000 Tachycardia, unspecified: Secondary | ICD-10-CM | POA: Insufficient documentation

## 2022-09-03 LAB — COMPREHENSIVE METABOLIC PANEL
ALT: 60 U/L — ABNORMAL HIGH (ref 0–44)
AST: 45 U/L — ABNORMAL HIGH (ref 15–41)
Albumin: 3.7 g/dL (ref 3.5–5.0)
Alkaline Phosphatase: 95 U/L (ref 38–126)
Anion gap: 12 (ref 5–15)
BUN: 18 mg/dL (ref 6–20)
CO2: 21 mmol/L — ABNORMAL LOW (ref 22–32)
Calcium: 9.5 mg/dL (ref 8.9–10.3)
Chloride: 100 mmol/L (ref 98–111)
Creatinine, Ser: 0.98 mg/dL (ref 0.44–1.00)
GFR, Estimated: >60 mL/min (ref >60)
Glucose, Bld: 187 mg/dL — ABNORMAL HIGH (ref 70–99)
Potassium: 4.4 mmol/L (ref 3.5–5.1)
Sodium: 133 mmol/L — ABNORMAL LOW (ref 135–145)
Total Bilirubin: 0.4 mg/dL (ref 0.3–1.2)
Total Protein: 7.5 g/dL (ref 6.5–8.1)

## 2022-09-03 LAB — CBC WITH DIFFERENTIAL (CANCER CENTER ONLY)
Abs Immature Granulocytes: 0.08 10*3/uL — ABNORMAL HIGH (ref 0.00–0.07)
Basophils Absolute: 0 10*3/uL (ref 0.0–0.1)
Basophils Relative: 0 %
Eosinophils Absolute: 0 10*3/uL (ref 0.0–0.5)
Eosinophils Relative: 0 %
HCT: 31.7 % — ABNORMAL LOW (ref 36.0–46.0)
Hemoglobin: 10.7 g/dL — ABNORMAL LOW (ref 12.0–15.0)
Immature Granulocytes: 1 %
Lymphocytes Relative: 5 %
Lymphs Abs: 0.5 10*3/uL — ABNORMAL LOW (ref 0.7–4.0)
MCH: 28.2 pg (ref 26.0–34.0)
MCHC: 33.8 g/dL (ref 30.0–36.0)
MCV: 83.6 fL (ref 80.0–100.0)
Monocytes Absolute: 0.1 10*3/uL (ref 0.1–1.0)
Monocytes Relative: 1 %
Neutro Abs: 9.5 10*3/uL — ABNORMAL HIGH (ref 1.7–7.7)
Neutrophils Relative %: 93 %
Platelet Count: 281 10*3/uL (ref 150–400)
RBC: 3.79 MIL/uL — ABNORMAL LOW (ref 3.87–5.11)
RDW: 15.8 % — ABNORMAL HIGH (ref 11.5–15.5)
WBC Count: 10.3 10*3/uL (ref 4.0–10.5)
nRBC: 0 % (ref 0.0–0.2)

## 2022-09-03 LAB — LACTATE DEHYDROGENASE: LDH: 236 U/L — ABNORMAL HIGH (ref 98–192)

## 2022-09-03 LAB — PREGNANCY, URINE: Preg Test, Ur: NEGATIVE

## 2022-09-03 MED ORDER — HEPARIN SOD (PORK) LOCK FLUSH 100 UNIT/ML IV SOLN: 500.0000 [IU] | Freq: Once | INTRAVENOUS | Status: DC | PRN

## 2022-09-03 MED ORDER — SODIUM CHLORIDE 0.9 % IV SOLN
600.0000 mg/m2 | Freq: Once | INTRAVENOUS | Status: AC
  Administered 2022-09-03: 1260 mg via INTRAVENOUS
  Filled 2022-09-03: qty 63

## 2022-09-03 MED ORDER — SODIUM CHLORIDE 0.9 % IV SOLN: Freq: Once | INTRAVENOUS | Status: AC

## 2022-09-03 MED ORDER — SODIUM CHLORIDE 0.9 % IV SOLN
60.0000 mg/m2 | Freq: Once | INTRAVENOUS | Status: AC
  Administered 2022-09-03: 127 mg via INTRAVENOUS
  Filled 2022-09-03: qty 12.7

## 2022-09-03 MED ORDER — SODIUM CHLORIDE 0.9 % IV SOLN
10.0000 mg | Freq: Once | INTRAVENOUS | Status: AC
  Administered 2022-09-03: 10 mg via INTRAVENOUS
  Filled 2022-09-03: qty 10

## 2022-09-03 MED ORDER — PALONOSETRON HCL INJECTION 0.25 MG/5ML
0.2500 mg | Freq: Once | INTRAVENOUS | Status: AC
  Administered 2022-09-03: 0.25 mg via INTRAVENOUS
  Filled 2022-09-03: qty 5

## 2022-09-03 MED ORDER — SODIUM CHLORIDE 0.9% FLUSH: 10.0000 mL | INTRAVENOUS | Status: DC | PRN

## 2022-09-03 NOTE — Progress Notes (Signed)
Patient tolerated her first cycle fairly well, however did end up in the hospital for pancreatitis. The cause was unlikely to be from chemo.   She will proceed with cycle two today, with some dose modifications.   Oncology Nurse Navigator Documentation     09/03/2022   10:45 AM  Oncology Nurse Navigator Flowsheets  Navigator Follow Up Date: 09/24/2022  Navigator Follow Up Reason: Follow-up Appointment;Chemotherapy  Navigator Location [facility]  Navigator Encounter Type Appt/Treatment Plan Review  Patient Visit Type MedOnc  Treatment Phase Active Tx  Barriers/Navigation Needs Coordination of Care;Education  Interventions None Required  Acuity Level 2-Minimal Needs (1-2 Barriers Identified)  Support Groups/Services Friends and Family  Time Spent with Patient 15

## 2022-09-03 NOTE — Patient Instructions (Signed)

## 2022-09-03 NOTE — Patient Instructions (Signed)
[facility]  Discharge Instructions: Thank you for choosing [facility] to provide your oncology and hematology care.   If you have a lab appointment with the [facility], please go directly to the [facility] and check in at the registration area.  Wear comfortable clothing and clothing appropriate for easy access to any Portacath or PICC line.   We strive to give you quality time with your provider. You may need to reschedule your appointment if you arrive late (15 or more minutes).  Arriving late affects you and other patients whose appointments are after yours.  Also, if you miss three or more appointments without notifying the office, you may be dismissed from the clinic at the provider's discretion.      For prescription refill requests, have your pharmacy contact our office and allow 72 hours for refills to be completed.    Today you received the following chemotherapy and/or immunotherapy agents Taxotere, Cytoxan      To help prevent nausea and vomiting after your treatment, we encourage you to take your nausea medication as directed.  BELOW ARE SYMPTOMS THAT SHOULD BE REPORTED IMMEDIATELY: *FEVER GREATER THAN 100.4 F (38 C) OR HIGHER *CHILLS OR SWEATING *NAUSEA AND VOMITING THAT IS NOT CONTROLLED WITH YOUR NAUSEA MEDICATION *UNUSUAL SHORTNESS OF BREATH *UNUSUAL BRUISING OR BLEEDING *URINARY PROBLEMS (pain or burning when urinating, or frequent urination) *BOWEL PROBLEMS (unusual diarrhea, constipation, pain near the anus) TENDERNESS IN MOUTH AND THROAT WITH OR WITHOUT PRESENCE OF ULCERS (sore throat, sores in mouth, or a toothache) UNUSUAL RASH, SWELLING OR PAIN  UNUSUAL VAGINAL DISCHARGE OR ITCHING   Items with * indicate a potential emergency and should be followed up as soon as possible or go to the Emergency Department if any problems should occur.  Please show the CHEMOTHERAPY ALERT CARD or IMMUNOTHERAPY ALERT CARD at  check-in to the Emergency Department and triage nurse. Should you have questions after your visit or need to cancel or reschedule your appointment, please contact [facility]  [phone] and follow the prompts.  Office hours are 8:00 a.m. to 4:30 p.m. Monday - Friday. Please note that voicemails left after 4:00 p.m. may not be returned until the following business day.  We are closed weekends and major holidays. You have access to a nurse at all times for urgent questions. Please call the main number to the clinic [phone] and follow the prompts.  For any non-urgent questions, you may also contact your provider using MyChart. We now offer e-Visits for anyone 50 and older to request care online for non-urgent symptoms. For details visit [email or web address].   Also download the MyChart app! Go to the app store, search "MyChart", open the app, select [facility], and log in with your MyChart username and password.

## 2022-09-03 NOTE — Progress Notes (Signed)
Hematology and Oncology Follow Up Visit  Holly Leach [id number] 12-Jul-1970 52 y.o. 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  At her last chemotherapy, she was in the emergency room.  There is probable pancreatitis.  It may have been from the Trulicity that she was taking.  This has been stopped.  I suppose that the pancreatitis could also be from chemotherapy.  I think is very very rare to see pancreatitis with chemotherapy.  Given the aggressive nature of her cancer, I think we just need to pursue treatment.  I will decrease the dose of the Taxotere a little bit.  She feels well right now.  She has had no nausea or vomiting.  She has had no cough or shortness of breath.  She has had no diarrhea.  In fact, she had bad constipation after chemotherapy.  She has had no rashes.  There is been no bleeding.  She has had no fever.  She is still dealing with the recurrent seroma with respect to her breast cancer.  This seems to be improving.  Overall, her performance status is probably ECOG 1.    Medications:  Current Outpatient Medications:    ALPRAZolam (XANAX) 0.25 MG tablet, Take 0.25 mg by mouth at bedtime., Disp: , Rfl:    ALPRAZolam (XANAX) 0.25 MG tablet, Take 1 tablet (0.25 mg total) by mouth at bedtime as needed for anxiety., Disp: 30 tablet, Rfl: 0   Bacillus Coagulans-Inulin (PROBIOTIC-PREBIOTIC PO), Take 2.9 g by mouth daily., Disp: , Rfl:    carvedilol (COREG) 3.125 MG tablet, Take 3.125 mg by mouth 2 (two) times daily., Disp: , Rfl:    COLLAGEN PO, Take 1 tablet by mouth daily. New skin collagen, Disp: , Rfl:    Continuous Blood Gluc Sensor (FREESTYLE LIBRE 3 SENSOR) MISC, , Disp: , Rfl:     cyclobenzaprine (FLEXERIL) 5 MG tablet, Take 1 tablet (5 mg total) by mouth 3 (three) times daily as needed for muscle spasms., Disp: 30 tablet, Rfl: 1   dexamethasone (DECADRON) 4 MG tablet, Take 2 tabs by mouth 2 times daily starting day before chemo. Then take 2 tabs daily for 2 days starting day after chemo. Take with food., Disp: 30 tablet, Rfl: 1   famotidine (PEPCID) 40 MG tablet, Take 40 mg by mouth daily., Disp: , Rfl:    fluconazole (DIFLUCAN) 100 MG tablet, Take 1 tablet (100 mg total) by mouth daily., Disp: 30 tablet, Rfl: 2   fluticasone (FLONASE) 50 MCG/ACT nasal spray, Place 2 sprays into both nostrils daily as needed for allergies. (Patient not taking: Reported on 07/27/2022), Disp: , Rfl:    Levothyroxine Sodium 25 MCG CAPS, Take 25 mcg by mouth daily before breakfast., Disp: , Rfl:    lidocaine-prilocaine (EMLA) cream, Apply a dime size to port-a-cath 1-2 hours prior to access. Cover with [employer]. (Patient not taking: Reported on 08/07/2022), Disp: 30 g, Rfl: 2   lisinopril-hydrochlorothiazide (ZESTORETIC) 20-25 MG tablet, Take 1 tablet by mouth 2 (two) times daily., Disp: , Rfl:    loratadine (CLARITIN) 10 MG tablet, Take 10 mg by mouth daily., Disp: , Rfl:    Melatonin 10 MG TABS, Take 10 mg by mouth at bedtime., Disp: , Rfl:  meloxicam (MOBIC) 15 MG tablet, Take 15 mg by mouth daily as needed for pain. (Patient not taking: Reported on 07/27/2022), Disp: , Rfl:    montelukast (SINGULAIR) 10 MG tablet, Take 10 mg by mouth daily., Disp: , Rfl:    nystatin (MYCOSTATIN) 100000 UNIT/ML suspension, Take 5 mLs (500,000 Units total) by mouth 4 (four) times daily., Disp: 60 mL, Rfl: 0   ondansetron (ZOFRAN) 8 MG tablet, Take 1 tablet (8 mg total) by mouth every 8 (eight) hours as needed for nausea or vomiting. Start on the third day after chemotherapy. (Patient not taking: Reported on 08/07/2022), Disp: 30 tablet, Rfl: 1   prochlorperazine (COMPAZINE) 10 MG tablet, Take 1 tablet (10 mg total)  by mouth every 6 (six) hours as needed for nausea or vomiting. (Patient not taking: Reported on 08/07/2022), Disp: 30 tablet, Rfl: 1   TRULICITY [id number] SOPN, Inject 0.75 mg into the skin once a week. thursday, Disp: , Rfl:    UNABLE TO FIND, Cranial Prosthesis, Disp: 1 Units, Rfl: 0   Vitamin D, Ergocalciferol, (DRISDOL) 1.25 MG (50000 UNIT) CAPS capsule, Take 50,000 Units by mouth once a week., Disp: , Rfl:   Allergies:  Allergies  Allergen Reactions   Augmentin [Amoxicillin-Pot Clavulanate] Diarrhea    Yeast Infection    Past Medical History, Surgical history, Social history, and Family History were reviewed and updated.  Review of Systems: Review of Systems  Constitutional: Negative.   HENT:  Negative.    Eyes: Negative.   Respiratory: Negative.    Cardiovascular: Negative.   Gastrointestinal: Negative.   Endocrine: Negative.   Genitourinary: Negative.    Musculoskeletal: Negative.   Skin: Negative.   Neurological: Negative.   Hematological: Negative.   Psychiatric/Behavioral: Negative.      Physical Exam:  height is 5\' 3"  (1.6 m) and weight is 216 lb (98 kg). Her oral temperature is 97.6 F (36.4 C). Her blood pressure is 133/55 (abnormal) and her pulse is 101 (abnormal). Her respiration is 18 and oxygen saturation is 100%.   Wt Readings from Last 3 Encounters:  09/03/22 216 lb (98 kg)  08/13/22 215 lb (97.5 kg)  07/28/22 220 lb (99.8 kg)    Physical Exam Vitals reviewed.  Constitutional:      Comments: Breast exam shows right breast no masses, edema or erythema.  There is no right axillary adenopathy.  Left breast shows the lumpectomy at about the 2 o'clock position.  She has a point of tenderness just inferior to the lumpectomy site.  There is an area of firmness that is quite tender.  There is no erythema or swelling.  There is no nipple discharge on the left side.  She has no left axillary adenopathy.  HENT:     Head: Normocephalic and atraumatic.  Eyes:      Pupils: Pupils are equal, round, and reactive to light.  Cardiovascular:     Rate and Rhythm: Normal rate and regular rhythm.     Heart sounds: Normal heart sounds.  Pulmonary:     Effort: Pulmonary effort is normal.     Breath sounds: Normal breath sounds.  Abdominal:     General: Bowel sounds are normal.     Palpations: Abdomen is soft.  Musculoskeletal:        General: No tenderness or deformity. Normal range of motion.     Cervical back: Normal range of motion.  Lymphadenopathy:     Cervical: No cervical adenopathy.  Skin:    General: Skin  is warm and dry.     Findings: No erythema or rash.  Neurological:     Mental Status: She is alert and oriented to person, place, and time.  Psychiatric:        Behavior: Behavior normal.        Thought Content: Thought content normal.        Judgment: Judgment normal.     Lab Results  Component Value Date   WBC 10.3 09/03/2022   HGB 10.7 (L) 09/03/2022   HCT 31.7 (L) 09/03/2022   MCV 83.6 09/03/2022   PLT 281 09/03/2022     Chemistry      Component Value Date/Time   NA 130 (L) 08/16/2022 2208   K 3.4 (L) 08/16/2022 2208   CL 96 (L) 08/16/2022 2208   CO2 26 08/16/2022 2208   BUN 37 (H) 08/16/2022 2208   CREATININE 1.16 (H) 08/16/2022 2208   CREATININE 1.15 (H) 08/13/2022 1005      Component Value Date/Time   CALCIUM 8.8 (L) 08/16/2022 2208   ALKPHOS 79 08/16/2022 2208   AST 39 08/16/2022 2208   AST 32 08/13/2022 1005   ALT 51 (H) 08/16/2022 2208   ALT 62 (H) 08/13/2022 1005   BILITOT 0.8 08/16/2022 2208   BILITOT 0.5 08/13/2022 1005       Impression and Plan: Ms. Hetzer is a very nice 52 year old perimenopausal white female.  She has a fairly high-grade stage IIa ductal carcinoma of the left breast.  This has a very high Oncotype score.  Has a very high proliferation index.  Will go ahead with her second cycle of treatment.  I still plan on 4 cycles of treatment.  Again, I will decrease the dose of the  Taxotere.  He will be interesting to see if there is any issues with respect to the pancreas.  I know that the reports are incredibly rare regarding pancreatitis and chemotherapy with Taxotere/Cytoxan.  Hopefully, she will not have any problems with this cycle.  We will make sure that she lets [place] know if there is any issues that she has pain wise after treatment.  We will plan to get her back in another 3 weeks for her third cycle of treatment.    Volanda Napoleon, MD 4/4/202411:09 AM

## 2022-09-04 ENCOUNTER — Inpatient Hospital Stay: Payer: BC Managed Care – PPO

## 2022-09-04 VITALS — BP 122/71 | HR 79 | Temp 98.0°F | Resp 17

## 2022-09-04 DIAGNOSIS — C50912 Malignant neoplasm of unspecified site of left female breast: Secondary | ICD-10-CM | POA: Diagnosis not present

## 2022-09-04 MED ORDER — PEGFILGRASTIM-CBQV 6 MG/0.6ML ~~LOC~~ SOSY
6.0000 mg | PREFILLED_SYRINGE | Freq: Once | SUBCUTANEOUS | Status: AC
  Administered 2022-09-04: 6 mg via SUBCUTANEOUS
  Filled 2022-09-04: qty 0.6

## 2022-09-04 NOTE — Patient Instructions (Signed)

## 2022-09-07 ENCOUNTER — Other Ambulatory Visit: Payer: Self-pay

## 2022-09-07 DIAGNOSIS — C50412 Malignant neoplasm of upper-outer quadrant of left female breast: Secondary | ICD-10-CM

## 2022-09-07 MED ORDER — NYSTATIN 100000 UNIT/ML MT SUSP
5.0000 mL | Freq: Four times a day (QID) | OROMUCOSAL | 3 refills | Status: DC
Start: 2022-09-07 — End: 2022-11-19

## 2022-09-24 ENCOUNTER — Encounter: Payer: Self-pay | Admitting: *Deleted

## 2022-09-24 ENCOUNTER — Inpatient Hospital Stay: Payer: BC Managed Care – PPO

## 2022-09-24 ENCOUNTER — Encounter: Payer: Self-pay | Admitting: Hematology & Oncology

## 2022-09-24 ENCOUNTER — Inpatient Hospital Stay (HOSPITAL_BASED_OUTPATIENT_CLINIC_OR_DEPARTMENT_OTHER): Payer: BC Managed Care – PPO | Admitting: Hematology & Oncology

## 2022-09-24 VITALS — BP 129/57 | HR 76 | Temp 97.8°F | Resp 18 | Wt 219.0 lb

## 2022-09-24 DIAGNOSIS — C50912 Malignant neoplasm of unspecified site of left female breast: Secondary | ICD-10-CM

## 2022-09-24 LAB — CMP (CANCER CENTER ONLY)
ALT: 87 U/L — ABNORMAL HIGH (ref 0–44)
AST: 46 U/L — ABNORMAL HIGH (ref 15–41)
Albumin: 4.1 g/dL (ref 3.5–5.0)
Alkaline Phosphatase: 93 U/L (ref 38–126)
Anion gap: 14 (ref 5–15)
BUN: 23 mg/dL — ABNORMAL HIGH (ref 6–20)
CO2: 22 mmol/L (ref 22–32)
Calcium: 9.6 mg/dL (ref 8.9–10.3)
Chloride: 105 mmol/L (ref 98–111)
Creatinine: 0.92 mg/dL (ref 0.44–1.00)
GFR, Estimated: >60 mL/min (ref >60)
Glucose, Bld: 155 mg/dL — ABNORMAL HIGH (ref 70–99)
Potassium: 4.3 mmol/L (ref 3.5–5.1)
Sodium: 141 mmol/L (ref 135–145)
Total Bilirubin: 0.4 mg/dL (ref 0.3–1.2)
Total Protein: 6.7 g/dL (ref 6.5–8.1)

## 2022-09-24 LAB — LIPASE, BLOOD: Lipase: 25 U/L (ref 11–51)

## 2022-09-24 LAB — CBC WITH DIFFERENTIAL (CANCER CENTER ONLY)
Abs Immature Granulocytes: 0.11 10*3/uL — ABNORMAL HIGH (ref 0.00–0.07)
Basophils Absolute: 0 10*3/uL (ref 0.0–0.1)
Basophils Relative: 0 %
Eosinophils Absolute: 0 10*3/uL (ref 0.0–0.5)
Eosinophils Relative: 0 %
HCT: 28.6 % — ABNORMAL LOW (ref 36.0–46.0)
Hemoglobin: 9.8 g/dL — ABNORMAL LOW (ref 12.0–15.0)
Immature Granulocytes: 1 %
Lymphocytes Relative: 6 %
Lymphs Abs: 0.7 10*3/uL (ref 0.7–4.0)
MCH: 29.6 pg (ref 26.0–34.0)
MCHC: 34.3 g/dL (ref 30.0–36.0)
MCV: 86.4 fL (ref 80.0–100.0)
Monocytes Absolute: 0.4 10*3/uL (ref 0.1–1.0)
Monocytes Relative: 4 %
Neutro Abs: 10.9 10*3/uL — ABNORMAL HIGH (ref 1.7–7.7)
Neutrophils Relative %: 89 %
Platelet Count: 272 10*3/uL (ref 150–400)
RBC: 3.31 MIL/uL — ABNORMAL LOW (ref 3.87–5.11)
RDW: 18.6 % — ABNORMAL HIGH (ref 11.5–15.5)
WBC Count: 12.2 10*3/uL — ABNORMAL HIGH (ref 4.0–10.5)
nRBC: 0 % (ref 0.0–0.2)

## 2022-09-24 LAB — LACTATE DEHYDROGENASE: LDH: 203 U/L — ABNORMAL HIGH (ref 98–192)

## 2022-09-24 LAB — LIPID PANEL
Cholesterol: 253 mg/dL — ABNORMAL HIGH (ref 0–200)
HDL: 54 mg/dL (ref >40)
LDL Cholesterol: 139 mg/dL — ABNORMAL HIGH (ref 0–99)
Total CHOL/HDL Ratio: 4.7 RATIO
Triglycerides: 298 mg/dL — ABNORMAL HIGH (ref <150)
VLDL: 60 mg/dL — ABNORMAL HIGH (ref 0–40)

## 2022-09-24 MED ORDER — SODIUM CHLORIDE 0.9 % IV SOLN
600.0000 mg/m2 | Freq: Once | INTRAVENOUS | Status: AC
  Administered 2022-09-24: 1260 mg via INTRAVENOUS
  Filled 2022-09-24: qty 50

## 2022-09-24 MED ORDER — SODIUM CHLORIDE 0.9 % IV SOLN
60.0000 mg/m2 | Freq: Once | INTRAVENOUS | Status: AC
  Administered 2022-09-24: 127 mg via INTRAVENOUS
  Filled 2022-09-24: qty 12.7

## 2022-09-24 MED ORDER — SODIUM CHLORIDE 0.9 % IV SOLN: Freq: Once | INTRAVENOUS | Status: AC

## 2022-09-24 MED ORDER — PALONOSETRON HCL INJECTION 0.25 MG/5ML
0.2500 mg | Freq: Once | INTRAVENOUS | Status: AC
  Administered 2022-09-24: 0.25 mg via INTRAVENOUS
  Filled 2022-09-24: qty 5

## 2022-09-24 MED ORDER — SODIUM CHLORIDE 0.9% FLUSH
10.0000 mL | INTRAVENOUS | Status: DC | PRN
  Administered 2022-09-24: 10 mL

## 2022-09-24 MED ORDER — SODIUM CHLORIDE 0.9 % IV SOLN
10.0000 mg | Freq: Once | INTRAVENOUS | Status: AC
  Administered 2022-09-24: 10 mg via INTRAVENOUS
  Filled 2022-09-24: qty 10

## 2022-09-24 MED ORDER — HEPARIN SOD (PORK) LOCK FLUSH 100 UNIT/ML IV SOLN
500.0000 [IU] | Freq: Once | INTRAVENOUS | Status: AC | PRN
  Administered 2022-09-24: 500 [IU]

## 2022-09-24 NOTE — Patient Instructions (Signed)
[facility]  Discharge Instructions: Thank you for choosing [facility] to provide your oncology and hematology care.   If you have a lab appointment with the Cancer Center, please go directly to the Cancer Center and check in at the registration area.  Wear comfortable clothing and clothing appropriate for easy access to any Portacath or PICC line.   We strive to give you quality time with your provider. You may need to reschedule your appointment if you arrive late (15 or more minutes).  Arriving late affects you and other patients whose appointments are after yours.  Also, if you miss three or more appointments without notifying the office, you may be dismissed from the clinic at the provider's discretion.      For prescription refill requests, have your pharmacy contact our office and allow 72 hours for refills to be completed.    Today you received the following chemotherapy and/or immunotherapy agents Taxotere, Cytoxan      To help prevent nausea and vomiting after your treatment, we encourage you to take your nausea medication as directed.  BELOW ARE SYMPTOMS THAT SHOULD BE REPORTED IMMEDIATELY: *FEVER GREATER THAN 100.4 F (38 C) OR HIGHER *CHILLS OR SWEATING *NAUSEA AND VOMITING THAT IS NOT CONTROLLED WITH YOUR NAUSEA MEDICATION *UNUSUAL SHORTNESS OF BREATH *UNUSUAL BRUISING OR BLEEDING *URINARY PROBLEMS (pain or burning when urinating, or frequent urination) *BOWEL PROBLEMS (unusual diarrhea, constipation, pain near the anus) TENDERNESS IN MOUTH AND THROAT WITH OR WITHOUT PRESENCE OF ULCERS (sore throat, sores in mouth, or a toothache) UNUSUAL RASH, SWELLING OR PAIN  UNUSUAL VAGINAL DISCHARGE OR ITCHING   Items with * indicate a potential emergency and should be followed up as soon as possible or go to the Emergency Department if any problems should occur.  Please show the CHEMOTHERAPY ALERT CARD or IMMUNOTHERAPY ALERT CARD at  check-in to the Emergency Department and triage nurse. Should you have questions after your visit or need to cancel or reschedule your appointment, please contact [facility]  [phone] and follow the prompts.  Office hours are 8:00 a.m. to 4:30 p.m. Monday - Friday. Please note that voicemails left after 4:00 p.m. may not be returned until the following business day.  We are closed weekends and major holidays. You have access to a nurse at all times for urgent questions. Please call the main number to the clinic [phone] and follow the prompts.  For any non-urgent questions, you may also contact your provider using MyChart. We now offer e-Visits for anyone 18 and older to request care online for non-urgent symptoms. For details visit [email or web address].   Also download the MyChart app! Go to the app store, search "MyChart", open the app, select [facility], and log in with your MyChart username and password.   

## 2022-09-24 NOTE — Progress Notes (Signed)
Ok to treat with ALT of 87 per DR Ennever. dph

## 2022-09-24 NOTE — Progress Notes (Signed)
Hematology and Oncology Follow Up Visit  BRANDOLYN SHORTRIDGE [id number] 31-Jan-1971 52 y.o. 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 Witkop is back for follow-up.  She is doing okay.  She had an episode at [facility] recently.  She apparently was coming out of [employer] and had some shortness of breath.  She noted that her heart rate was 140s on her [employer] watch.  She went home.  Everything resolved in about 10 minutes.  She had no problems with the second cycle of chemotherapy.  She had no abdominal pain.  There is no issues with pancreatitis.  Her LFTs are still a little bit elevated.  However, her bilirubin is normal.  We will continue to treat.  She has had no cough.  There is been no rashes.  She has had no bleeding.  There is been no leg swelling.  Overall, I would say that her performance status is probably ECOG 1.    Medications:  Current Outpatient Medications:    carvedilol (COREG) 3.125 MG tablet, Take 3.125 mg by mouth 2 (two) times daily., Disp: , Rfl:    COLLAGEN PO, Take 1 tablet by mouth daily. New skin collagen, Disp: , Rfl:    Continuous Blood Gluc Sensor (FREESTYLE LIBRE 3 SENSOR) MISC, , Disp: , Rfl:    dexamethasone (DECADRON) 4 MG tablet, Take 2 tabs by mouth 2 times daily starting day before chemo. Then take 2 tabs daily for 2 days starting day after chemo. Take with food., Disp: 30 tablet, Rfl: 1   famotidine (PEPCID) 40 MG tablet, Take 40 mg by mouth daily., Disp: , Rfl:    Levothyroxine Sodium 25 MCG CAPS, Take 25 mcg by mouth daily before breakfast., Disp: , Rfl:    lidocaine-prilocaine (EMLA) cream, Apply a dime size to port-a-cath 1-2 hours prior to access. Cover with [employer]., Disp: 30 g, Rfl: 2    lisinopril-hydrochlorothiazide (ZESTORETIC) 20-25 MG tablet, Take 1 tablet by mouth 2 (two) times daily., Disp: , Rfl:    loratadine (CLARITIN) 10 MG tablet, Take 10 mg by mouth daily., Disp: , Rfl:    Melatonin 10 MG TABS, Take 10 mg by mouth at bedtime., Disp: , Rfl:    montelukast (SINGULAIR) 10 MG tablet, Take 10 mg by mouth daily., Disp: , Rfl:    ondansetron (ZOFRAN) 8 MG tablet, Take 1 tablet (8 mg total) by mouth every 8 (eight) hours as needed for nausea or vomiting. Start on the third day after chemotherapy., Disp: 30 tablet, Rfl: 1   prochlorperazine (COMPAZINE) 10 MG tablet, Take 1 tablet (10 mg total) by mouth every 6 (six) hours as needed for nausea or vomiting., Disp: 30 tablet, Rfl: 1   UNABLE TO FIND, Cranial Prosthesis, Disp: 1 Units, Rfl: 0   Vitamin D, Ergocalciferol, (DRISDOL) 1.25 MG (50000 UNIT) CAPS capsule, Take 50,000 Units by mouth once a week., Disp: , Rfl:    ALPRAZolam (XANAX) 0.25 MG tablet, Take 0.25 mg by mouth at bedtime. (Patient not taking: Reported on 09/24/2022), Disp: , Rfl:    ALPRAZolam (XANAX) 0.25 MG tablet, Take 1 tablet (0.25 mg total) by mouth at bedtime as needed for anxiety. (Patient not taking: Reported on 09/24/2022), Disp: 30 tablet, Rfl: 0   Bacillus Coagulans-Inulin (PROBIOTIC-PREBIOTIC PO), Take  2.9 g by mouth daily. (Patient not taking: Reported on 09/24/2022), Disp: , Rfl:    cyclobenzaprine (FLEXERIL) 5 MG tablet, Take 1 tablet (5 mg total) by mouth 3 (three) times daily as needed for muscle spasms. (Patient not taking: Reported on 09/24/2022), Disp: 30 tablet, Rfl: 1   fluconazole (DIFLUCAN) 100 MG tablet, Take 1 tablet (100 mg total) by mouth daily. (Patient not taking: Reported on 09/24/2022), Disp: 30 tablet, Rfl: 2   fluticasone (FLONASE) 50 MCG/ACT nasal spray, Place 2 sprays into both nostrils daily as needed for allergies. (Patient not taking: Reported on 09/24/2022), Disp: , Rfl:    meloxicam (MOBIC) 15 MG tablet, Take 15 mg by mouth daily as  needed for pain. (Patient not taking: Reported on 07/27/2022), Disp: , Rfl:    nystatin (MYCOSTATIN) 100000 UNIT/ML suspension, Take 5 mLs (500,000 Units total) by mouth 4 (four) times daily. (Patient not taking: Reported on 09/24/2022), Disp: 60 mL, Rfl: 3   TRULICITY 0.75 MG/0.5ML SOPN, Inject 0.75 mg into the skin once a week. thursday (Patient not taking: Reported on 09/24/2022), Disp: , Rfl:   Allergies:  Allergies  Allergen Reactions   Augmentin [Amoxicillin-Pot Clavulanate] Diarrhea    Yeast Infection    Past Medical History, Surgical history, Social history, and Family History were reviewed and updated.  Review of Systems: Review of Systems  Constitutional: Negative.   HENT:  Negative.    Eyes: Negative.   Respiratory: Negative.    Cardiovascular: Negative.   Gastrointestinal: Negative.   Endocrine: Negative.   Genitourinary: Negative.    Musculoskeletal: Negative.   Skin: Negative.   Neurological: Negative.   Hematological: Negative.   Psychiatric/Behavioral: Negative.      Physical Exam:  weight is 219 lb (99.3 kg). Her oral temperature is 97.8 F (36.6 C). Her blood pressure is 129/57 (abnormal) and her pulse is 76. Her respiration is 18 and oxygen saturation is 100%.   Wt Readings from Last 3 Encounters:  09/24/22 219 lb (99.3 kg)  09/03/22 216 lb (98 kg)  08/13/22 215 lb (97.5 kg)    Physical Exam Vitals reviewed.  Constitutional:      Comments: Breast exam shows right breast no masses, edema or erythema.  There is no right axillary adenopathy.  Left breast shows the lumpectomy at about the 2 o'clock position.  She has a point of tenderness just inferior to the lumpectomy site.  There is an area of firmness that is quite tender.  There is no erythema or swelling.  There is no nipple discharge on the left side.  She has no left axillary adenopathy.  HENT:     Head: Normocephalic and atraumatic.  Eyes:     Pupils: Pupils are equal, round, and reactive to light.   Cardiovascular:     Rate and Rhythm: Normal rate and regular rhythm.     Heart sounds: Normal heart sounds.  Pulmonary:     Effort: Pulmonary effort is normal.     Breath sounds: Normal breath sounds.  Abdominal:     General: Bowel sounds are normal.     Palpations: Abdomen is soft.  Musculoskeletal:        General: No tenderness or deformity. Normal range of motion.     Cervical back: Normal range of motion.  Lymphadenopathy:     Cervical: No cervical adenopathy.  Skin:    General: Skin is warm and dry.     Findings: No erythema or rash.  Neurological:     Mental  Status: She is alert and oriented to person, place, and time.  Psychiatric:        Behavior: Behavior normal.        Thought Content: Thought content normal.        Judgment: Judgment normal.    Lab Results  Component Value Date   WBC 12.2 (H) 09/24/2022   HGB 9.8 (L) 09/24/2022   HCT 28.6 (L) 09/24/2022   MCV 86.4 09/24/2022   PLT 272 09/24/2022     Chemistry      Component Value Date/Time   NA 133 (L) 09/03/2022 0955   K 4.4 09/03/2022 0955   CL 100 09/03/2022 0955   CO2 21 (L) 09/03/2022 0955   BUN 18 09/03/2022 0955   CREATININE 0.98 09/03/2022 0955   CREATININE 1.15 (H) 08/13/2022 1005      Component Value Date/Time   CALCIUM 9.5 09/03/2022 0955   ALKPHOS 95 09/03/2022 0955   AST 45 (H) 09/03/2022 0955   AST 32 08/13/2022 1005   ALT 60 (H) 09/03/2022 0955   ALT 62 (H) 08/13/2022 1005   BILITOT 0.4 09/03/2022 0955   BILITOT 0.5 08/13/2022 1005       Impression and Plan: Ms. Strauser is a very nice 52 year old perimenopausal white female.  She has a fairly high-grade stage IIa ductal carcinoma of the left breast.  This has a very high Oncotype score.  Has a very high proliferation index.  Will go ahead with her 3rd cycle of treatment.  I still plan on 4 cycles of treatment.  Again, I did decrease the dose of the Taxotere.  I am not sure what to make of this tachycardia that she had.  Her  heart rate is normal today.  Her rhythm is regular.  I do not think we need to do an EKG.  She says that this has happened to her before.  Again, it seems to resolve on its own.  We will plan to get her back in 3 weeks for her third, and final cycle of treatment.  I am just happy that we are getting through treatment.  I just feel we had to be aggressive given the high Oncotype score and the high proliferation index.  Josph Macho, MD 4/25/20249:52 AM

## 2022-09-24 NOTE — Progress Notes (Signed)
Patient is cleared for her third treatment cycle.   Patient is applying for a [facility] and needs paperwork to complete her application. She needs confirmation of pathology and staging. Patient provided with pathology report and office note stating stage.  Oncology Nurse Navigator Documentation     09/24/2022    9:45 AM  Oncology Nurse Navigator Flowsheets  Navigator Follow Up Date: 10/15/2022  Navigator Follow Up Reason: Follow-up Appointment;Chemotherapy  Navigator Location [facility]  Navigator Encounter Type Treatment;Appt/Treatment Plan Review  Patient Visit Type MedOnc  Treatment Phase Active Tx  Barriers/Navigation Needs Coordination of Care;Education  Education Other  Interventions Coordination of Care;Education  Acuity Level 2-Minimal Needs (1-2 Barriers Identified)  Education Method Verbal  Support Groups/Services Friends and Family  Time Spent with Patient 30

## 2022-09-25 ENCOUNTER — Inpatient Hospital Stay: Payer: BC Managed Care – PPO

## 2022-09-25 VITALS — BP 97/71 | HR 83 | Temp 97.6°F | Resp 18

## 2022-09-25 DIAGNOSIS — C50912 Malignant neoplasm of unspecified site of left female breast: Secondary | ICD-10-CM | POA: Diagnosis not present

## 2022-09-25 MED ORDER — PEGFILGRASTIM-CBQV 6 MG/0.6ML ~~LOC~~ SOSY
6.0000 mg | PREFILLED_SYRINGE | Freq: Once | SUBCUTANEOUS | Status: AC
  Administered 2022-09-25: 6 mg via SUBCUTANEOUS
  Filled 2022-09-25: qty 0.6

## 2022-09-25 NOTE — Patient Instructions (Signed)

## 2022-10-08 ENCOUNTER — Other Ambulatory Visit: Payer: Self-pay | Admitting: *Deleted

## 2022-10-08 DIAGNOSIS — Z170 Estrogen receptor positive status [ER+]: Secondary | ICD-10-CM

## 2022-10-08 DIAGNOSIS — C50912 Malignant neoplasm of unspecified site of left female breast: Secondary | ICD-10-CM

## 2022-10-09 ENCOUNTER — Encounter: Payer: Self-pay | Admitting: *Deleted

## 2022-10-09 ENCOUNTER — Inpatient Hospital Stay: Payer: BC Managed Care – PPO

## 2022-10-09 ENCOUNTER — Inpatient Hospital Stay: Payer: BC Managed Care – PPO | Attending: Hematology & Oncology

## 2022-10-09 VITALS — BP 106/70 | HR 84 | Temp 98.0°F | Resp 18

## 2022-10-09 DIAGNOSIS — Z79890 Hormone replacement therapy (postmenopausal): Secondary | ICD-10-CM | POA: Insufficient documentation

## 2022-10-09 DIAGNOSIS — Z5111 Encounter for antineoplastic chemotherapy: Secondary | ICD-10-CM | POA: Diagnosis not present

## 2022-10-09 DIAGNOSIS — Z79811 Long term (current) use of aromatase inhibitors: Secondary | ICD-10-CM | POA: Insufficient documentation

## 2022-10-09 DIAGNOSIS — Z923 Personal history of irradiation: Secondary | ICD-10-CM | POA: Diagnosis not present

## 2022-10-09 DIAGNOSIS — Z7952 Long term (current) use of systemic steroids: Secondary | ICD-10-CM | POA: Diagnosis not present

## 2022-10-09 DIAGNOSIS — R000 Tachycardia, unspecified: Secondary | ICD-10-CM | POA: Diagnosis not present

## 2022-10-09 DIAGNOSIS — Z5189 Encounter for other specified aftercare: Secondary | ICD-10-CM | POA: Insufficient documentation

## 2022-10-09 DIAGNOSIS — C50912 Malignant neoplasm of unspecified site of left female breast: Secondary | ICD-10-CM

## 2022-10-09 DIAGNOSIS — Z79899 Other long term (current) drug therapy: Secondary | ICD-10-CM | POA: Insufficient documentation

## 2022-10-09 DIAGNOSIS — Z170 Estrogen receptor positive status [ER+]: Secondary | ICD-10-CM | POA: Diagnosis not present

## 2022-10-09 LAB — CMP (CANCER CENTER ONLY)
ALT: 41 U/L (ref 0–44)
AST: 27 U/L (ref 15–41)
Albumin: 4.2 g/dL (ref 3.5–5.0)
Alkaline Phosphatase: 86 U/L (ref 38–126)
Anion gap: 11 (ref 5–15)
BUN: 15 mg/dL (ref 6–20)
CO2: 24 mmol/L (ref 22–32)
Calcium: 9.8 mg/dL (ref 8.9–10.3)
Chloride: 101 mmol/L (ref 98–111)
Creatinine: 1 mg/dL (ref 0.44–1.00)
GFR, Estimated: >60 mL/min (ref >60)
Glucose, Bld: 105 mg/dL — ABNORMAL HIGH (ref 70–99)
Potassium: 4.4 mmol/L (ref 3.5–5.1)
Sodium: 136 mmol/L (ref 135–145)
Total Bilirubin: 0.4 mg/dL (ref 0.3–1.2)
Total Protein: 6.8 g/dL (ref 6.5–8.1)

## 2022-10-09 LAB — CBC WITH DIFFERENTIAL (CANCER CENTER ONLY)
Abs Immature Granulocytes: 0.28 10*3/uL — ABNORMAL HIGH (ref 0.00–0.07)
Basophils Absolute: 0.1 10*3/uL (ref 0.0–0.1)
Basophils Relative: 1 %
Eosinophils Absolute: 0 10*3/uL (ref 0.0–0.5)
Eosinophils Relative: 0 %
HCT: 28.2 % — ABNORMAL LOW (ref 36.0–46.0)
Hemoglobin: 9.3 g/dL — ABNORMAL LOW (ref 12.0–15.0)
Immature Granulocytes: 3 %
Lymphocytes Relative: 18 %
Lymphs Abs: 1.5 10*3/uL (ref 0.7–4.0)
MCH: 29.3 pg (ref 26.0–34.0)
MCHC: 33 g/dL (ref 30.0–36.0)
MCV: 89 fL (ref 80.0–100.0)
Monocytes Absolute: 0.6 10*3/uL (ref 0.1–1.0)
Monocytes Relative: 7 %
Neutro Abs: 6 10*3/uL (ref 1.7–7.7)
Neutrophils Relative %: 71 %
Platelet Count: 208 10*3/uL (ref 150–400)
RBC: 3.17 MIL/uL — ABNORMAL LOW (ref 3.87–5.11)
RDW: 18.8 % — ABNORMAL HIGH (ref 11.5–15.5)
WBC Count: 8.4 10*3/uL (ref 4.0–10.5)
nRBC: 1.1 % — ABNORMAL HIGH (ref 0.0–0.2)

## 2022-10-09 MED ORDER — SODIUM CHLORIDE 0.9% FLUSH
10.0000 mL | Freq: Once | INTRAVENOUS | Status: AC
  Administered 2022-10-09: 10 mL via INTRAVENOUS

## 2022-10-09 MED ORDER — HEPARIN SOD (PORK) LOCK FLUSH 100 UNIT/ML IV SOLN
500.0000 [IU] | Freq: Once | INTRAVENOUS | Status: AC
  Administered 2022-10-09: 500 [IU] via INTRAVENOUS

## 2022-10-09 NOTE — Patient Instructions (Signed)

## 2022-10-12 ENCOUNTER — Ambulatory Visit: Payer: BC Managed Care – PPO

## 2022-10-14 ENCOUNTER — Ambulatory Visit: Payer: BC Managed Care – PPO | Attending: General Surgery | Admitting: Physical Therapy

## 2022-10-14 DIAGNOSIS — C50412 Malignant neoplasm of upper-outer quadrant of left female breast: Secondary | ICD-10-CM | POA: Insufficient documentation

## 2022-10-14 DIAGNOSIS — Z170 Estrogen receptor positive status [ER+]: Secondary | ICD-10-CM | POA: Insufficient documentation

## 2022-10-14 NOTE — Therapy (Signed)
OUTPATIENT PHYSICAL THERAPY SOZO SCREENING NOTE   Patient Name: Holly Leach MRN: [id number] DOB:02-11-1971, 52 y.o., female Today's Date: 10/14/2022  PCP: Dois Davenport, MD REFERRING PROVIDER: Almond Lint, MD   PT End of Session - 10/14/22 1708     Visit Number 8   unchanged due to screen only   PT Start Time 1553    PT Stop Time 1600    PT Time Calculation (min) 7 min    Activity Tolerance Patient tolerated treatment well    Behavior During Therapy WFL for tasks assessed/performed             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   Past Surgical History:  Procedure Laterality Date   BREAST BIOPSY Left 06/19/2022   [place] LT BREAST BX W LOC DEV 1ST LESION IMG BX SPEC US GUIDE 06/19/2022 [facility]   BREAST BIOPSY  07/07/2022   MM LT RADIOACTIVE SEED LOC MAMMO GUIDE 07/07/2022 [facility]   BREAST LUMPECTOMY WITH RADIOACTIVE SEED AND SENTINEL LYMPH NODE BIOPSY Left 07/08/2022   Procedure: LEFT BREAST LUMPECTOMY WITH RADIOACTIVE SEED AND SENTINEL LYMPH NODE BIOPSY;  Surgeon: Almond Lint, MD;  Location: [facility] OR;  Service: General;  Laterality: Left;   COLONOSCOPY     COLPOSCOPY  2006   FINE NEEDLE ASPIRATION Left 07/28/2022   Procedure: ASPIRATION OF LEFT BREAST SEROMA;  Surgeon: Almond Lint, MD;  Location: [facility] OR;  Service: General;  Laterality: Left;   FRACTURE SURGERY Left 2020   foot - pt denies having surgery for this   HERNIA REPAIR     PORTACATH PLACEMENT Left 07/28/2022   Procedure: INSERTION PORT-A-CATH WITH  ULTRASOUND GUIDANCE;  Surgeon: Almond Lint, MD;  Location: [facility] OR;  Service: General;  Laterality: Left;   Patient Active Problem List   Diagnosis Date Noted   Stage II breast cancer, left (HCC) 07/27/2022   Neck mass 11/03/2013   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 It measures 1.4 x1.3 x 1.1 cm and is located in the upper outer quadrant. It is ER+, PR-, HER2-  with a Ki67 of 85%. Plan is to undergo a L breast lumpectomy and SLNB on 07/08/22. 07/08/22- L breast lumpectomy and SLNB (0/3)   PRECAUTIONS: left UE Lymphedema risk,   SUBJECTIVE: Pt is here for 3 month SOZO screening.   PAIN:  Are you having pain? No  SOZO SCREENING: Patient was assessed today using the SOZO machine to determine the lymphedema index score. This was compared to her baseline score. It was determined that she is within the recommended range when compared to her baseline and no further action is needed at this time. She will continue SOZO screenings. These are done  every 3 months for 2 years post operatively followed by every 6 months for 2 years, and then annually.     [facility], PT 10/14/2022, 5:09 PM

## 2022-10-15 ENCOUNTER — Inpatient Hospital Stay: Payer: BC Managed Care – PPO

## 2022-10-15 ENCOUNTER — Other Ambulatory Visit: Payer: Self-pay

## 2022-10-15 ENCOUNTER — Encounter: Payer: Self-pay | Admitting: *Deleted

## 2022-10-15 ENCOUNTER — Encounter: Payer: Self-pay | Admitting: Hematology & Oncology

## 2022-10-15 ENCOUNTER — Inpatient Hospital Stay (HOSPITAL_BASED_OUTPATIENT_CLINIC_OR_DEPARTMENT_OTHER): Payer: BC Managed Care – PPO | Admitting: Hematology & Oncology

## 2022-10-15 VITALS — BP 110/50 | HR 96 | Temp 98.2°F | Resp 17 | Ht 63.0 in | Wt 219.0 lb

## 2022-10-15 VITALS — BP 134/70 | HR 75

## 2022-10-15 DIAGNOSIS — C50912 Malignant neoplasm of unspecified site of left female breast: Secondary | ICD-10-CM

## 2022-10-15 LAB — CMP (CANCER CENTER ONLY)
ALT: 53 U/L — ABNORMAL HIGH (ref 0–44)
AST: 30 U/L (ref 15–41)
Albumin: 4.4 g/dL (ref 3.5–5.0)
Alkaline Phosphatase: 75 U/L (ref 38–126)
Anion gap: 9 (ref 5–15)
BUN: 21 mg/dL — ABNORMAL HIGH (ref 6–20)
CO2: 22 mmol/L (ref 22–32)
Calcium: 10.4 mg/dL — ABNORMAL HIGH (ref 8.9–10.3)
Chloride: 106 mmol/L (ref 98–111)
Creatinine: 0.92 mg/dL (ref 0.44–1.00)
GFR, Estimated: >60 mL/min (ref >60)
Glucose, Bld: 167 mg/dL — ABNORMAL HIGH (ref 70–99)
Potassium: 4.6 mmol/L (ref 3.5–5.1)
Sodium: 137 mmol/L (ref 135–145)
Total Bilirubin: 0.4 mg/dL (ref 0.3–1.2)
Total Protein: 7 g/dL (ref 6.5–8.1)

## 2022-10-15 LAB — CBC WITH DIFFERENTIAL (CANCER CENTER ONLY)
Abs Immature Granulocytes: 0.1 10*3/uL — ABNORMAL HIGH (ref 0.00–0.07)
Basophils Absolute: 0 10*3/uL (ref 0.0–0.1)
Basophils Relative: 0 %
Eosinophils Absolute: 0 10*3/uL (ref 0.0–0.5)
Eosinophils Relative: 0 %
HCT: 28 % — ABNORMAL LOW (ref 36.0–46.0)
Hemoglobin: 9.3 g/dL — ABNORMAL LOW (ref 12.0–15.0)
Immature Granulocytes: 1 %
Lymphocytes Relative: 6 %
Lymphs Abs: 0.6 10*3/uL — ABNORMAL LOW (ref 0.7–4.0)
MCH: 30 pg (ref 26.0–34.0)
MCHC: 33.2 g/dL (ref 30.0–36.0)
MCV: 90.3 fL (ref 80.0–100.0)
Monocytes Absolute: 0.2 10*3/uL (ref 0.1–1.0)
Monocytes Relative: 2 %
Neutro Abs: 9.4 10*3/uL — ABNORMAL HIGH (ref 1.7–7.7)
Neutrophils Relative %: 91 %
Platelet Count: 246 10*3/uL (ref 150–400)
RBC: 3.1 MIL/uL — ABNORMAL LOW (ref 3.87–5.11)
RDW: 19.1 % — ABNORMAL HIGH (ref 11.5–15.5)
WBC Count: 10.2 10*3/uL (ref 4.0–10.5)
nRBC: 0 % (ref 0.0–0.2)

## 2022-10-15 LAB — MAGNESIUM: Magnesium: 1.9 mg/dL (ref 1.7–2.4)

## 2022-10-15 LAB — LACTATE DEHYDROGENASE: LDH: 210 U/L — ABNORMAL HIGH (ref 98–192)

## 2022-10-15 MED ORDER — SODIUM CHLORIDE 0.9 % IV SOLN
60.0000 mg/m2 | Freq: Once | INTRAVENOUS | Status: AC
  Administered 2022-10-15: 127 mg via INTRAVENOUS
  Filled 2022-10-15: qty 12.7

## 2022-10-15 MED ORDER — SODIUM CHLORIDE 0.9% FLUSH
10.0000 mL | INTRAVENOUS | Status: DC | PRN
  Administered 2022-10-15: 10 mL

## 2022-10-15 MED ORDER — PALONOSETRON HCL INJECTION 0.25 MG/5ML
0.2500 mg | Freq: Once | INTRAVENOUS | Status: AC
  Administered 2022-10-15: 0.25 mg via INTRAVENOUS
  Filled 2022-10-15: qty 5

## 2022-10-15 MED ORDER — SODIUM CHLORIDE 0.9 % IV SOLN: Freq: Once | INTRAVENOUS | Status: AC

## 2022-10-15 MED ORDER — SODIUM CHLORIDE 0.9 % IV SOLN
10.0000 mg | Freq: Once | INTRAVENOUS | Status: AC
  Administered 2022-10-15: 10 mg via INTRAVENOUS
  Filled 2022-10-15: qty 10

## 2022-10-15 MED ORDER — SODIUM CHLORIDE 0.9 % IV SOLN
600.0000 mg/m2 | Freq: Once | INTRAVENOUS | Status: AC
  Administered 2022-10-15: 1260 mg via INTRAVENOUS
  Filled 2022-10-15: qty 63

## 2022-10-15 MED ORDER — COLD PACK MISC ONCOLOGY: 1.0000 | Freq: Once | Status: DC | PRN

## 2022-10-15 MED ORDER — HEPARIN SOD (PORK) LOCK FLUSH 100 UNIT/ML IV SOLN
500.0000 [IU] | Freq: Once | INTRAVENOUS | Status: AC | PRN
  Administered 2022-10-15: 500 [IU]

## 2022-10-15 NOTE — Progress Notes (Signed)
Patient receiving her final chemotherapy treatment today. She will be ready for radiation in about four weeks. Referral placed for Dr Basilio Cairo who she consulted with previously.   Oncology Nurse Navigator Documentation     10/15/2022   10:00 AM  Oncology Nurse Navigator Flowsheets  Phase of Treatment Chemo  Chemotherapy Actual End Date: 10/15/2022  Navigator Follow Up Date: 11/19/2022  Navigator Follow Up Reason: Follow-up Appointment  Navigator Location [facility]  Navigator Encounter Type Treatment;Appt/Treatment Plan Review  Patient Visit Type MedOnc  Treatment Phase Final Chemo TX  Barriers/Navigation Needs Coordination of Care;Education  Interventions Psycho-Social Support;Referrals  Acuity Level 2-Minimal Needs (1-2 Barriers Identified)  Referrals Radiation Oncology  Support Groups/Services Friends and Family  Time Spent with Patient 15

## 2022-10-15 NOTE — Patient Instructions (Signed)
[facility]  Discharge Instructions: Thank you for choosing [facility] to provide your oncology and hematology care.   If you have a lab appointment with the Cancer Center, please go directly to the Cancer Center and check in at the registration area.  Wear comfortable clothing and clothing appropriate for easy access to any Portacath or PICC line.   We strive to give you quality time with your provider. You may need to reschedule your appointment if you arrive late (15 or more minutes).  Arriving late affects you and other patients whose appointments are after yours.  Also, if you miss three or more appointments without notifying the office, you may be dismissed from the clinic at the provider's discretion.      For prescription refill requests, have your pharmacy contact our office and allow 72 hours for refills to be completed.    Today you received the following chemotherapy and/or immunotherapy agents Taxotere, Cytoxan      To help prevent nausea and vomiting after your treatment, we encourage you to take your nausea medication as directed.  BELOW ARE SYMPTOMS THAT SHOULD BE REPORTED IMMEDIATELY: *FEVER GREATER THAN 100.4 F (38 C) OR HIGHER *CHILLS OR SWEATING *NAUSEA AND VOMITING THAT IS NOT CONTROLLED WITH YOUR NAUSEA MEDICATION *UNUSUAL SHORTNESS OF BREATH *UNUSUAL BRUISING OR BLEEDING *URINARY PROBLEMS (pain or burning when urinating, or frequent urination) *BOWEL PROBLEMS (unusual diarrhea, constipation, pain near the anus) TENDERNESS IN MOUTH AND THROAT WITH OR WITHOUT PRESENCE OF ULCERS (sore throat, sores in mouth, or a toothache) UNUSUAL RASH, SWELLING OR PAIN  UNUSUAL VAGINAL DISCHARGE OR ITCHING   Items with * indicate a potential emergency and should be followed up as soon as possible or go to the Emergency Department if any problems should occur.  Please show the CHEMOTHERAPY ALERT CARD or IMMUNOTHERAPY ALERT CARD at  check-in to the Emergency Department and triage nurse. Should you have questions after your visit or need to cancel or reschedule your appointment, please contact [facility]  [phone] and follow the prompts.  Office hours are 8:00 a.m. to 4:30 p.m. Monday - Friday. Please note that voicemails left after 4:00 p.m. may not be returned until the following business day.  We are closed weekends and major holidays. You have access to a nurse at all times for urgent questions. Please call the main number to the clinic [phone] and follow the prompts.  For any non-urgent questions, you may also contact your provider using MyChart. We now offer e-Visits for anyone 18 and older to request care online for non-urgent symptoms. For details visit [email or web address].   Also download the MyChart app! Go to the app store, search "MyChart", open the app, select [facility], and log in with your MyChart username and password.   

## 2022-10-15 NOTE — Patient Instructions (Signed)

## 2022-10-15 NOTE — Progress Notes (Signed)
Hematology and Oncology Follow Up Visit  Holly Leach [id number] 12-Oct-1970 52 y.o. 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  This will be her last cycle of chemotherapy.  She has done quite nicely.  She has had some issues.  However, they are temporary.  She has had no problems with nausea or vomiting.  She does get tired.  She does have some intermittent tachycardia.  She would like to see a cardiologist.  She will talk to her family doctor about this.  She has had no change in bowel or bladder habits.  There is been no rashes.  She has had no bleeding.  There is been no leg swelling.  She has had no mouth sores.  She had she was down at [facility].  She was at a [employer]  She really had a tremendous time down there.  She found the conference to be very uplifting.  I am glad that she was able to go and enjoy it.  Currently, I would have said that her performance status is ECOG 0.      Medications:  Current Outpatient Medications:    ALPRAZolam (XANAX) 0.25 MG tablet, Take 1 tablet (0.25 mg total) by mouth at bedtime as needed for anxiety. (Patient not taking: Reported on 09/24/2022), Disp: 30 tablet, Rfl: 0   Bacillus Coagulans-Inulin (PROBIOTIC-PREBIOTIC PO), Take 2.9 g by mouth daily. (Patient not taking: Reported on 09/24/2022), Disp: , Rfl:    carvedilol (COREG) 3.125 MG tablet, Take 3.125 mg by mouth 2 (two) times daily., Disp: , Rfl:    COLLAGEN PO, Take 1 tablet by mouth daily. New skin collagen, Disp: , Rfl:    dexamethasone (DECADRON) 4 MG tablet, Take 2 tabs by mouth 2 times daily starting day before chemo. Then take 2 tabs daily for 2 days starting day after chemo.  Take with food., Disp: 30 tablet, Rfl: 1   famotidine (PEPCID) 40 MG tablet, Take 40 mg by mouth daily., Disp: , Rfl:    fluconazole (DIFLUCAN) 100 MG tablet, Take 1 tablet (100 mg total) by mouth daily. (Patient not taking: Reported on 09/24/2022), Disp: 30 tablet, Rfl: 2   fluticasone (FLONASE) 50 MCG/ACT nasal spray, Place 2 sprays into both nostrils daily as needed for allergies. (Patient not taking: Reported on 09/24/2022), Disp: , Rfl:    Levothyroxine Sodium 25 MCG CAPS, Take 25 mcg by mouth daily before breakfast., Disp: , Rfl:    lidocaine-prilocaine (EMLA) cream, Apply a dime size to port-a-cath 1-2 hours prior to access. Cover with [employer]., Disp: 30 g, Rfl: 2   lisinopril-hydrochlorothiazide (ZESTORETIC) 20-25 MG tablet, Take 1 tablet by mouth 2 (two) times daily., Disp: , Rfl:    loratadine (CLARITIN) 10 MG tablet, Take 10 mg by mouth daily., Disp: , Rfl:    Melatonin 10 MG TABS, Take 10 mg by mouth at bedtime., Disp: , Rfl:    montelukast (SINGULAIR) 10 MG tablet, Take 10 mg by mouth daily., Disp: , Rfl:    nystatin (MYCOSTATIN) 100000 UNIT/ML suspension, Take 5 mLs (500,000 Units total) by mouth 4 (four) times daily. (Patient not taking: Reported on 09/24/2022), Disp: 60 mL, Rfl: 3   ondansetron (ZOFRAN) 8 MG tablet, Take  1 tablet (8 mg total) by mouth every 8 (eight) hours as needed for nausea or vomiting. Start on the third day after chemotherapy., Disp: 30 tablet, Rfl: 1   prochlorperazine (COMPAZINE) 10 MG tablet, Take 1 tablet (10 mg total) by mouth every 6 (six) hours as needed for nausea or vomiting., Disp: 30 tablet, Rfl: 1   UNABLE TO FIND, Cranial Prosthesis, Disp: 1 Units, Rfl: 0   Vitamin D, Ergocalciferol, (DRISDOL) 1.25 MG (50000 UNIT) CAPS capsule, Take 50,000 Units by mouth once a week., Disp: , Rfl:   Allergies:  Allergies  Allergen Reactions   Augmentin [Amoxicillin-Pot Clavulanate] Diarrhea    Yeast Infection    Past Medical History, Surgical history, Social  history, and Family History were reviewed and updated.  Review of Systems: Review of Systems  Constitutional: Negative.   HENT:  Negative.    Eyes: Negative.   Respiratory: Negative.    Cardiovascular: Negative.   Gastrointestinal: Negative.   Endocrine: Negative.   Genitourinary: Negative.    Musculoskeletal: Negative.   Skin: Negative.   Neurological: Negative.   Hematological: Negative.   Psychiatric/Behavioral: Negative.      Physical Exam:  height is 5\' 3"  (1.6 m) and weight is 219 lb (99.3 kg). Her oral temperature is 98.2 F (36.8 C). Her blood pressure is 110/50 (abnormal) and her pulse is 96. Her respiration is 17 and oxygen saturation is 99%.   Wt Readings from Last 3 Encounters:  10/15/22 219 lb (99.3 kg)  09/24/22 219 lb (99.3 kg)  09/03/22 216 lb (98 kg)    Physical Exam Vitals reviewed.  Constitutional:      Comments: Breast exam shows right breast no masses, edema or erythema.  There is no right axillary adenopathy.  Left breast shows the lumpectomy at about the 2 o'clock position.  She has a point of tenderness just inferior to the lumpectomy site.  There is an area of firmness that is quite tender.  There is no erythema or swelling.  There is no nipple discharge on the left side.  She has no left axillary adenopathy.  HENT:     Head: Normocephalic and atraumatic.  Eyes:     Pupils: Pupils are equal, round, and reactive to light.  Cardiovascular:     Rate and Rhythm: Normal rate and regular rhythm.     Heart sounds: Normal heart sounds.  Pulmonary:     Effort: Pulmonary effort is normal.     Breath sounds: Normal breath sounds.  Abdominal:     General: Bowel sounds are normal.     Palpations: Abdomen is soft.  Musculoskeletal:        General: No tenderness or deformity. Normal range of motion.     Cervical back: Normal range of motion.  Lymphadenopathy:     Cervical: No cervical adenopathy.  Skin:    General: Skin is warm and dry.     Findings: No  erythema or rash.  Neurological:     Mental Status: She is alert and oriented to person, place, and time.  Psychiatric:        Behavior: Behavior normal.        Thought Content: Thought content normal.        Judgment: Judgment normal.     Lab Results  Component Value Date   WBC 10.2 10/15/2022   HGB 9.3 (L) 10/15/2022   HCT 28.0 (L) 10/15/2022   MCV 90.3 10/15/2022   PLT 246 10/15/2022     Chemistry  Component Value Date/Time   NA 137 10/15/2022 0945   K 4.6 10/15/2022 0945   CL 106 10/15/2022 0945   CO2 22 10/15/2022 0945   BUN 21 (H) 10/15/2022 0945   CREATININE 0.92 10/15/2022 0945      Component Value Date/Time   CALCIUM 10.4 (H) 10/15/2022 0945   ALKPHOS 75 10/15/2022 0945   AST 30 10/15/2022 0945   ALT 53 (H) 10/15/2022 0945   BILITOT 0.4 10/15/2022 0945       Impression and Plan: Ms. Popiel is a very nice 52 year old perimenopausal white female.  She has a fairly high-grade stage IIa ductal carcinoma of the left breast.  This has a very high Oncotype score.  Has a very high proliferation index.  Will go ahead with her fourth and final cycle of treatment.  I do so happy that she has made it through her chemotherapy.  We will now have her go for radiation therapy.  I think she has seen Dr. Basilio Cairo in the past.  I would think that she should be ready for radiation in about a month or so.  She will need to be on antiestrogen.  I would think that she should do well on letrozole.  We will start that after she has radiation.  We will plan for follow-up in 1 month.  By then, she should be ready for radiation.   Josph Macho, MD 5/16/202410:39 AM

## 2022-10-16 ENCOUNTER — Inpatient Hospital Stay: Payer: BC Managed Care – PPO

## 2022-10-16 VITALS — BP 92/64 | HR 76 | Temp 98.0°F | Resp 17

## 2022-10-16 DIAGNOSIS — C50912 Malignant neoplasm of unspecified site of left female breast: Secondary | ICD-10-CM

## 2022-10-16 MED ORDER — PEGFILGRASTIM-CBQV 6 MG/0.6ML ~~LOC~~ SOSY
6.0000 mg | PREFILLED_SYRINGE | Freq: Once | SUBCUTANEOUS | Status: AC
  Administered 2022-10-16: 6 mg via SUBCUTANEOUS
  Filled 2022-10-16: qty 0.6

## 2022-10-16 NOTE — Patient Instructions (Signed)

## 2022-10-19 ENCOUNTER — Encounter: Payer: Self-pay | Admitting: Hematology & Oncology

## 2022-10-23 ENCOUNTER — Other Ambulatory Visit: Payer: Self-pay

## 2022-10-23 ENCOUNTER — Telehealth: Payer: Self-pay

## 2022-10-23 MED ORDER — MEGESTROL ACETATE 20 MG PO TABS: 20.0000 mg | ORAL_TABLET | Freq: Every day | ORAL | 5 refills | Status: DC

## 2022-10-23 NOTE — Progress Notes (Signed)
Location of Breast Cancer: Stage II breast cancer, left   Histology per Pathology Report:  07-08-22 FINAL MICROSCOPIC DIAGNOSIS:  A. BREAST, LEFT W/SEED, LUMPECTOMY: - Invasive ductal carcinoma, 2.2 cm, grade 3 - Ductal carcinoma in situ, high-grade with focal necrosis - Resection margins are negative for carcinoma; closest is the posterior margin at 0.3 cm - Biopsy site changes - See oncology table  B. SENTINEL LYMPH NODE, LEFT AXILLARY #1, BIOPSY: - Lymph node, negative for carcinoma (0/1)  C. SENTINEL LYMPH NODE, LEFT AXILLARY #2, BIOPSY: - Lymph node, negative for carcinoma (0/1)  D. SENTINEL LYMPH NODE, LEFT AXILLARY #3, BIOPSY: - Lymph node, negative for carcinoma (0/1)  E. BREAST, LEFT ADDITIONAL INFERIOR MARGIN, EXCISION: - Benign breast parenchyma, negative for carcinoma  F. BREAST, LEFT ADDITIONAL MEDIAL MARGIN, EXCISION: - Mild fibrocystic change, negative for carcinoma  ONCOLOGY TABLE:  Procedure: Lumpectomy Specimen Laterality: Left Histologic Type: Invasive ductal carcinoma Histologic Grade:      Glandular (Acinar)/Tubular Differentiation: 3      Nuclear Pleomorphism: 3      Mitotic Rate: 3      Overall Grade: 3 Tumor Size: 2.2 cm Ductal Carcinoma In Situ: Present, high-grade Treatment Effect in the Breast: No known presurgical therapy Margins: All margins negative for invasive carcinoma      Distance from Closest Margin (mm): 3 mm      Specify Closest Margin (required only if <70mm): Posterior margin DCIS Margins: Uninvolved by DCIS      Distance from Closest Margin (mm): 4 mm      Specify Closest Margin (required only if <63mm): Posterior margin Regional Lymph Nodes:      Number of Lymph Nodes Examined: 3      Number of Sentinel Nodes Examined: 3      Number of Lymph Nodes with Macrometastases (>2 mm): 0      Number of Lymph Nodes with Micrometastases: 0      Number of Lymph Nodes with Isolated Tumor Cells (=0.2 mm or =200 cells): 0      Size of  Largest Metastatic Deposit (mm): Not applicable      Extranodal Extension: Not applicable Distant Metastasis:      Distant Site(s) Involved: Not applicable Breast Biomarker Testing Performed on Previous Biopsy:      Testing Performed on Case Number: [id number]            Estrogen Receptor: 40%, positive, weak staining intensity            Progesterone Receptor: 0%, negative            HER2: Negative (0)            Ki-67: 85% Pathologic Stage Classification (pTNM, AJCC 8th Edition): pT2, pN0 Representative Tumor Block: A5 Comment(s): None  (v4.5.0.0)  Receptor Status: ER(40%), PR (0%), Her2-neu (neg), Ki-67(85%)  Did patient present with symptoms (if so, please note symptoms) or was this found on screening mammography?: screening mammogram  Past/Anticipated interventions by surgeon, if any:  07-08-22 Left Breast Radioactive seed localized lumpectomy and sentinel lymph node biopsy   Indications: This patient presents with history of left breast cancer, cT1cN0 grade 2 invasive ductal carcinoma, upper outer quadrant, ER+/PR-/Her2-  Pre-operative Diagnosis: left breast cancer   Post-operative Diagnosis: Same   Surgeon: Almond Lint   07-28-22 PROCEDURE: left subclavian port placement, Bard ClearVue Power Port, MRI safe, 8-French, aspiration of left breast/axillary seroma     SURGEON:  Almond Lint, MD      Past/Anticipated interventions by medical  oncology, if any:  Dr. 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 Senatus is a very nice 52 year old perimenopausal white female.  She has a fairly high-grade stage IIa ductal carcinoma of the left breast.  This has a very high Oncotype score.  Has a very high  proliferation index.   Will go ahead with her fourth and final cycle of treatment.  I do so happy that she has made it through her chemotherapy.   We will now have her go for radiation therapy.  I think she has seen Dr. Basilio Cairo in the past.  I would think that she should be ready for radiation in about a month or so.   She will need to be on antiestrogen.  I would think that she should do well on letrozole.  We will start that after she has radiation.   We will plan for follow-up in 1 month.  By then, she should be ready for radiation.   Lymphedema issues, if any:  Denies   Pain issues, if any:  Denies  SAFETY ISSUES: Prior radiation? No Pacemaker/ICD? No Possible current pregnancy? No--reports menstrual cycle stopped prior to starting systemic treatment and has not resumed (states recent visit to GYN showed she was menopausal) Is the patient on methotrexate? no  Current Complaints / other details:  Had repeat mammogram yesterday per Dr. Gustavo Lah request

## 2022-10-23 NOTE — Telephone Encounter (Signed)
Received phone call from patient stating that she is having hot flashes and unable to sleep at night. Pt stated Dr. Myna Hidalgo told her that there was a medication for the hot flashes since they started after her chemotherapy infusions. Discussed patient request with Dr. Myna Hidalgo who gave order to send prescription of Megace 20 mg daily to her pharmacy. Pt aware and had no further questions.

## 2022-10-30 LAB — LAB REPORT - SCANNED: EGFR (Non-African Amer.): 75.2

## 2022-10-31 DIAGNOSIS — Z923 Personal history of irradiation: Secondary | ICD-10-CM

## 2022-10-31 HISTORY — DX: Personal history of irradiation: Z92.3

## 2022-11-05 ENCOUNTER — Ambulatory Visit
Admission: RE | Admit: 2022-11-05 | Discharge: 2022-11-05 | Disposition: A | Discharge status: Home / Self Care | Payer: BC Managed Care – PPO | Source: Ambulatory Visit | Attending: Hematology & Oncology | Admitting: Hematology & Oncology

## 2022-11-05 DIAGNOSIS — C50912 Malignant neoplasm of unspecified site of left female breast: Secondary | ICD-10-CM

## 2022-11-05 NOTE — Progress Notes (Signed)
Radiation Oncology         [phone] ________________________________  Name: Holly Leach MRN: [id number]  Date: 11/06/2022  DOB: June 08, 1970  Follow-Up Visit Note  Outpatient  CC: Dois Davenport, MD  Josph Macho, MD  Diagnosis:      ICD-10-CM   1. Malignant neoplasm of upper-outer quadrant of left breast in female, estrogen receptor positive (HCC)  C50.412    Z17.0        Left Breast UOQ, Invasive ductal carcinoma with high-grade DCIS, ER+ / PR- / Her2-, Grade 3: s/p left lumpectomy w/ SLN biopsies followed by adjuvant chemotherapy    Cancer Staging  Stage II breast cancer, left (HCC) Staging form: Breast, AJCC 8th Edition - Clinical stage from 07/27/2022: Stage IIB (cT2, cN0, cM0, G3, ER+, PR-, HER2-) - Signed by Josph Macho, MD on 07/27/2022  CHIEF COMPLAINT: Here to discuss management of left breast cancer  Narrative:  The patient returns today for follow-up.     Since her consultation date of 06/30/22, she opted to proceed with a left breast lumpectomy with SLN biopsies on 07/08/22 under the care of Dr. Donell Beers. Pathology from the procedure revealed: tumor size of 2.2 cm; histology of grade 3 invasive ductal carcinoma with high-grade DCIS and focal necrosis; all margins negative for invasive and in situ carcinoma; margin status to invasive disease of 3 mm from the posterior margin; margin status to in situ disease of 4 mm from the posterior margin; nodal status of 3/3 left axillary sentinel lymph node excisions negative for carcinoma;  ER status: 40% positive with weak staining intensity; PR status negative; Proliferation marker Ki67 at 85%; Her2 status 0% negative; Grade 3.  Oncotype DX was obtained on the final surgical sample and the recurrence score of 53 predicts a risk of recurrence outside the breast over the next 9 years of >39%, if the patient's only systemic therapy is an antiestrogen for 5 years.  It also predicted a significant benefit from chemotherapy  (>15%).  Systemic therapy, if applicable, involved (dates and therapy as follows): She has been treated with adjuvant chemotherapy consisting of Taxotere and Cytoxan x 4 cycles from 08/06/22 through 10/15/22 under the care of Dr. Myna Hidalgo. She tolerated systemic treatment relatively well overall with some mild side effects including tachycardia. In the midst of systemic treatment, the patient presented to the ED on 08/16/22 with generalized abdominal pain x 1 day and constipation. Pertinent lab results performed in the ED included: dehydration with hyponatremia at 130 and chloride at 96, elevated leukocytosis at 14.1, lipase elevated at 98, and urinalysis consistent with possible infection with leukocytes and bacteria noted. CT of the abdomen performed showed concern for acute pancreatitis. Given that she wasn't having significant RUQ and epigastric tenderness, she was advised to manage her symptoms at home with over-the-counter pain medications if needed and to ensure that she stays adequately hydrated.   Based on Dr. Gustavo Lah recommendation, she is agreeable to proceed with antiestrogen therapy consisting of letrozole following XRT.  Pertinent imaging performed in the interval since her initial consultation date (dates and results as follows):  -- Ultrasound of the right upper abdomen on 07/03/22 demonstrated nonspecific diffuse increased echotexture of the liver which is sometimes seen in fatty liver infiltration.   -- Bilateral diagnostic mammogram on 11/05/22 demonstrated no evidence of malignancy in either breast with new lumpectomy changes in the left breast.    Symptomatically, the patient reports:  Lymphedema issues, if any:  Denies  Pain issues, if any:  Denies  SAFETY ISSUES: Prior radiation? No Pacemaker/ICD? No Possible current pregnancy? No--reports menstrual cycle stopped prior to starting systemic treatment and has not resumed (states recent visit to GYN showed she was  menopausal) Is the patient on methotrexate? no  Current Complaints / other details:  Had repeat mammogram yesterday per Dr. Gustavo Lah request         ALLERGIES:  is allergic to augmentin [amoxicillin-pot clavulanate].  Meds: Current Outpatient Medications  Medication Sig Dispense Refill   ALPRAZolam (XANAX) 0.25 MG tablet Take 1 tablet (0.25 mg total) by mouth at bedtime as needed for anxiety. (Patient not taking: Reported on 09/24/2022) 30 tablet 0   Bacillus Coagulans-Inulin (PROBIOTIC-PREBIOTIC PO) Take 2.9 g by mouth daily. (Patient not taking: Reported on 09/24/2022)     carvedilol (COREG) 3.125 MG tablet Take 3.125 mg by mouth 2 (two) times daily.     COLLAGEN PO Take 1 tablet by mouth daily. New skin collagen     dexamethasone (DECADRON) 4 MG tablet Take 2 tabs by mouth 2 times daily starting day before chemo. Then take 2 tabs daily for 2 days starting day after chemo. Take with food. 30 tablet 1   famotidine (PEPCID) 40 MG tablet Take 40 mg by mouth daily.     fluconazole (DIFLUCAN) 100 MG tablet Take 1 tablet (100 mg total) by mouth daily. (Patient not taking: Reported on 09/24/2022) 30 tablet 2   fluticasone (FLONASE) 50 MCG/ACT nasal spray Place 2 sprays into both nostrils daily as needed for allergies. (Patient not taking: Reported on 09/24/2022)     Levothyroxine Sodium 25 MCG CAPS Take 25 mcg by mouth daily before breakfast.     lidocaine-prilocaine (EMLA) cream Apply a dime size to port-a-cath 1-2 hours prior to access. Cover with [employer]. 30 g 2   lisinopril-hydrochlorothiazide (ZESTORETIC) 20-25 MG tablet Take 1 tablet by mouth 2 (two) times daily.     loratadine (CLARITIN) 10 MG tablet Take 10 mg by mouth daily.     megestrol (MEGACE) 20 MG tablet Take 1 tablet (20 mg total) by mouth daily. Take 1 tablet (20 mg total) by mouth daily for hot flashes. 30 tablet 5   Melatonin 10 MG TABS Take 10 mg by mouth at bedtime.     montelukast (SINGULAIR) 10 MG tablet Take 10 mg by mouth  daily.     nystatin (MYCOSTATIN) 100000 UNIT/ML suspension Take 5 mLs (500,000 Units total) by mouth 4 (four) times daily. (Patient not taking: Reported on 09/24/2022) 60 mL 3   ondansetron (ZOFRAN) 8 MG tablet Take 1 tablet (8 mg total) by mouth every 8 (eight) hours as needed for nausea or vomiting. Start on the third day after chemotherapy. 30 tablet 1   prochlorperazine (COMPAZINE) 10 MG tablet Take 1 tablet (10 mg total) by mouth every 6 (six) hours as needed for nausea or vomiting. 30 tablet 1   UNABLE TO FIND Cranial Prosthesis 1 Units 0   Vitamin D, Ergocalciferol, (DRISDOL) 1.25 MG (50000 UNIT) CAPS capsule Take 50,000 Units by mouth once a week.     No current facility-administered medications for this encounter.    Physical Findings:  height is 5\' 3"  (1.6 m) and weight is 225 lb 3.2 oz (102.2 kg). Her temperature is 97.6 F (36.4 C). Her blood pressure is 92/64 and her pulse is 105 (abnormal). Her respiration is 20 and oxygen saturation is 100%. .     General: Alert and oriented, in no  acute distress HEENT: Head is normocephalic.   Musculoskeletal: symmetric strength and muscle tone throughout. Neurologic: No obvious focalities. Speech is fluent.  Psychiatric: Judgment and insight are intact. Affect is appropriate. Breast exam reveals satisfactory healing, left breast lumpectomy/axillary sites  Lab Findings: Lab Results  Component Value Date   WBC 10.2 10/15/2022   HGB 9.3 (L) 10/15/2022   HCT 28.0 (L) 10/15/2022   MCV 90.3 10/15/2022   PLT 246 10/15/2022      Radiographic Findings: MM 3D DIAGNOSTIC MAMMOGRAM BILATERAL BREAST  Result Date: 11/05/2022 CLINICAL DATA:  52 year old female with history of left breast cancer post lumpectomy 07/08/2022 followed by chemotherapy. Patient is scheduled to soon start radiation therapy. Request to evaluate mammographic appearance of the bilateral breasts following chemotherapy. EXAM: DIGITAL DIAGNOSTIC BILATERAL MAMMOGRAM WITH  TOMOSYNTHESIS TECHNIQUE: Bilateral digital diagnostic mammography and breast tomosynthesis was performed. COMPARISON:  Previous exam(s). ACR Breast Density Category b: There are scattered areas of fibroglandular density. FINDINGS: No suspicious masses or calcifications seen in either breast. New lumpectomy changes are present in the upper-outer posterior left breast with associated density and distortion. Spot compression magnification view of the left breast lumpectomy site was performed. There is no mammographic evidence of locally recurrent malignancy. IMPRESSION: New lumpectomy changes in the left breast. No mammographic evidence of malignancy in either breast. RECOMMENDATION: Diagnostic mammogram is suggested in 1 year. (Code:[id number]-01Y) I have discussed the findings and recommendations with the patient. If applicable, a reminder letter will be sent to the patient regarding the next appointment. BI-RADS CATEGORY  2: Benign. Electronically Signed   By: Edwin Cap M.D.   On: 11/05/2022 14:36    Impression/Plan: Left breast cancer We discussed adjuvant radiotherapy today.  I recommend radiation for 4 weeks (Standard hypofractionation) to the left breast in order to minimize risk of local recurrence.  I reviewed the logistics, benefits, risks, and potential side effects of this treatment in detail. Risks may include but not necessary be limited to acute and late injury tissue in the radiation fields such as skin irritation (change in color/pigmentation, itching, dryness, pain, peeling). She may experience fatigue. We also discussed possible risk of long term cosmetic changes or scar tissue. There is also a smaller risk for lung toxicity, cardiac toxicity, brachial plexopathy, lymphedema, musculoskeletal changes, rib fragility or induction of a second malignancy, late chronic non-healing soft tissue wound.    The patient asked good questions which I answered to her satisfaction. She is enthusiastic about  proceeding with treatment. A consent form has been previously signed and placed in her chart. Treatment planning to occur today.   Urine preg test ordered to precede RT out of an abundance of caution.  On date of service, in total, I spent 40 minutes on this encounter. Patient was seen in person.  _____________________________________   Lonie Peak, MD  This document serves as a record of services personally performed by Lonie Peak, MD. It was created on her behalf by Neena Rhymes, a trained medical scribe. The creation of this record is based on the scribe's personal observations and the provider's statements to them. This document has been checked and approved by the attending provider.

## 2022-11-06 ENCOUNTER — Ambulatory Visit
Admission: RE | Admit: 2022-11-06 | Discharge: 2022-11-06 | Disposition: A | Discharge status: Home / Self Care | Payer: BC Managed Care – PPO | Source: Ambulatory Visit | Attending: Radiation Oncology | Admitting: Radiation Oncology

## 2022-11-06 ENCOUNTER — Other Ambulatory Visit: Payer: Self-pay

## 2022-11-06 ENCOUNTER — Telehealth: Payer: Self-pay | Admitting: *Deleted

## 2022-11-06 ENCOUNTER — Encounter: Payer: Self-pay | Admitting: Radiation Oncology

## 2022-11-06 VITALS — BP 92/64 | HR 105 | Temp 97.6°F | Resp 20 | Ht 63.0 in | Wt 225.2 lb

## 2022-11-06 DIAGNOSIS — R7989 Other specified abnormal findings of blood chemistry: Secondary | ICD-10-CM | POA: Insufficient documentation

## 2022-11-06 DIAGNOSIS — Z510 Encounter for antineoplastic radiation therapy: Secondary | ICD-10-CM | POA: Insufficient documentation

## 2022-11-06 DIAGNOSIS — Z79890 Hormone replacement therapy (postmenopausal): Secondary | ICD-10-CM | POA: Diagnosis not present

## 2022-11-06 DIAGNOSIS — Z170 Estrogen receptor positive status [ER+]: Secondary | ICD-10-CM

## 2022-11-06 DIAGNOSIS — Z79899 Other long term (current) drug therapy: Secondary | ICD-10-CM | POA: Diagnosis not present

## 2022-11-06 DIAGNOSIS — C50412 Malignant neoplasm of upper-outer quadrant of left female breast: Secondary | ICD-10-CM | POA: Insufficient documentation

## 2022-11-06 DIAGNOSIS — Z923 Personal history of irradiation: Secondary | ICD-10-CM | POA: Diagnosis not present

## 2022-11-06 NOTE — Telephone Encounter (Signed)
CALLED PATIENT TO ASK ABOUT COMING IN FOR A LAB, PATIENT AGREED TO COME ON 11-17-22 @ 3 PM

## 2022-11-17 ENCOUNTER — Ambulatory Visit
Admission: RE | Admit: 2022-11-17 | Discharge: 2022-11-17 | Disposition: A | Discharge status: Home / Self Care | Payer: BC Managed Care – PPO | Source: Ambulatory Visit | Attending: Radiation Oncology | Admitting: Radiation Oncology

## 2022-11-17 ENCOUNTER — Other Ambulatory Visit: Payer: Self-pay

## 2022-11-17 DIAGNOSIS — Z170 Estrogen receptor positive status [ER+]: Secondary | ICD-10-CM

## 2022-11-17 DIAGNOSIS — C50412 Malignant neoplasm of upper-outer quadrant of left female breast: Secondary | ICD-10-CM | POA: Diagnosis not present

## 2022-11-17 LAB — PREGNANCY, URINE: Preg Test, Ur: NEGATIVE

## 2022-11-18 ENCOUNTER — Ambulatory Visit
Admission: RE | Admit: 2022-11-18 | Discharge: 2022-11-18 | Disposition: A | Discharge status: Home / Self Care | Payer: BC Managed Care – PPO | Source: Ambulatory Visit | Attending: Radiation Oncology | Admitting: Radiation Oncology

## 2022-11-18 ENCOUNTER — Other Ambulatory Visit: Payer: Self-pay

## 2022-11-18 DIAGNOSIS — C50412 Malignant neoplasm of upper-outer quadrant of left female breast: Secondary | ICD-10-CM | POA: Diagnosis not present

## 2022-11-18 LAB — RAD ONC ARIA SESSION SUMMARY
Course Elapsed Days: 0
Plan Fractions Treated to Date: 1
Plan Prescribed Dose Per Fraction: 2.67 Gy
Plan Total Fractions Prescribed: 15
Plan Total Prescribed Dose: 40.05 Gy
Reference Point Dosage Given to Date: 2.67 Gy
Reference Point Session Dosage Given: 2.67 Gy
Session Number: 1

## 2022-11-19 ENCOUNTER — Encounter: Payer: Self-pay | Admitting: *Deleted

## 2022-11-19 ENCOUNTER — Other Ambulatory Visit: Payer: Self-pay

## 2022-11-19 ENCOUNTER — Inpatient Hospital Stay: Payer: BC Managed Care – PPO

## 2022-11-19 ENCOUNTER — Inpatient Hospital Stay (HOSPITAL_BASED_OUTPATIENT_CLINIC_OR_DEPARTMENT_OTHER): Payer: BC Managed Care – PPO | Admitting: Hematology & Oncology

## 2022-11-19 ENCOUNTER — Ambulatory Visit
Admission: RE | Admit: 2022-11-19 | Discharge: 2022-11-19 | Disposition: A | Discharge status: Home / Self Care | Payer: BC Managed Care – PPO | Source: Ambulatory Visit | Attending: Radiation Oncology | Admitting: Radiation Oncology

## 2022-11-19 ENCOUNTER — Encounter: Payer: Self-pay | Admitting: Hematology & Oncology

## 2022-11-19 VITALS — BP 95/56 | HR 93 | Temp 97.9°F | Resp 17 | Ht 63.0 in | Wt 224.0 lb

## 2022-11-19 DIAGNOSIS — C50412 Malignant neoplasm of upper-outer quadrant of left female breast: Secondary | ICD-10-CM | POA: Diagnosis not present

## 2022-11-19 DIAGNOSIS — C50912 Malignant neoplasm of unspecified site of left female breast: Secondary | ICD-10-CM | POA: Insufficient documentation

## 2022-11-19 DIAGNOSIS — Z79899 Other long term (current) drug therapy: Secondary | ICD-10-CM | POA: Insufficient documentation

## 2022-11-19 DIAGNOSIS — Z923 Personal history of irradiation: Secondary | ICD-10-CM | POA: Insufficient documentation

## 2022-11-19 DIAGNOSIS — Z79890 Hormone replacement therapy (postmenopausal): Secondary | ICD-10-CM | POA: Insufficient documentation

## 2022-11-19 DIAGNOSIS — Z170 Estrogen receptor positive status [ER+]: Secondary | ICD-10-CM | POA: Insufficient documentation

## 2022-11-19 DIAGNOSIS — R7989 Other specified abnormal findings of blood chemistry: Secondary | ICD-10-CM | POA: Insufficient documentation

## 2022-11-19 LAB — CBC WITH DIFFERENTIAL (CANCER CENTER ONLY)
Abs Immature Granulocytes: 0.03 10*3/uL (ref 0.00–0.07)
Basophils Absolute: 0 10*3/uL (ref 0.0–0.1)
Basophils Relative: 1 %
Eosinophils Absolute: 0.1 10*3/uL (ref 0.0–0.5)
Eosinophils Relative: 1 %
HCT: 29.6 % — ABNORMAL LOW (ref 36.0–46.0)
Hemoglobin: 9.7 g/dL — ABNORMAL LOW (ref 12.0–15.0)
Immature Granulocytes: 1 %
Lymphocytes Relative: 18 %
Lymphs Abs: 1.1 10*3/uL (ref 0.7–4.0)
MCH: 30.1 pg (ref 26.0–34.0)
MCHC: 32.8 g/dL (ref 30.0–36.0)
MCV: 91.9 fL (ref 80.0–100.0)
Monocytes Absolute: 0.4 10*3/uL (ref 0.1–1.0)
Monocytes Relative: 6 %
Neutro Abs: 4.5 10*3/uL (ref 1.7–7.7)
Neutrophils Relative %: 73 %
Platelet Count: 280 10*3/uL (ref 150–400)
RBC: 3.22 MIL/uL — ABNORMAL LOW (ref 3.87–5.11)
RDW: 15.6 % — ABNORMAL HIGH (ref 11.5–15.5)
WBC Count: 6.1 10*3/uL (ref 4.0–10.5)
nRBC: 0 % (ref 0.0–0.2)

## 2022-11-19 LAB — CMP (CANCER CENTER ONLY)
ALT: 16 U/L (ref 0–44)
AST: 15 U/L (ref 15–41)
Albumin: 4.2 g/dL (ref 3.5–5.0)
Alkaline Phosphatase: 59 U/L (ref 38–126)
Anion gap: 10 (ref 5–15)
BUN: 24 mg/dL — ABNORMAL HIGH (ref 6–20)
CO2: 20 mmol/L — ABNORMAL LOW (ref 22–32)
Calcium: 9.6 mg/dL (ref 8.9–10.3)
Chloride: 107 mmol/L (ref 98–111)
Creatinine: 1.1 mg/dL — ABNORMAL HIGH (ref 0.44–1.00)
GFR, Estimated: >60 mL/min (ref >60)
Glucose, Bld: 125 mg/dL — ABNORMAL HIGH (ref 70–99)
Potassium: 4.5 mmol/L (ref 3.5–5.1)
Sodium: 137 mmol/L (ref 135–145)
Total Bilirubin: 0.4 mg/dL (ref 0.3–1.2)
Total Protein: 6.6 g/dL (ref 6.5–8.1)

## 2022-11-19 LAB — RAD ONC ARIA SESSION SUMMARY
Course Elapsed Days: 1
Plan Fractions Treated to Date: 2
Plan Prescribed Dose Per Fraction: 2.67 Gy
Plan Total Fractions Prescribed: 15
Plan Total Prescribed Dose: 40.05 Gy
Reference Point Dosage Given to Date: 5.34 Gy
Reference Point Session Dosage Given: 2.67 Gy
Session Number: 2

## 2022-11-19 LAB — IRON AND IRON BINDING CAPACITY (CC-WL,HP ONLY)
Iron: 73 ug/dL (ref 28–170)
Saturation Ratios: 16 % (ref 10.4–31.8)
TIBC: 448 ug/dL (ref 250–450)
UIBC: 375 ug/dL (ref 148–442)

## 2022-11-19 LAB — FERRITIN: Ferritin: 121 ng/mL (ref 11–307)

## 2022-11-19 NOTE — Progress Notes (Signed)
Patient is doing well post chemo. She is now beginning radiation. She is interested in genetic testing. I explained that just based on diagnosis alone she may not qualify for testing through insurance (she denies significant family history). She wishes to proceed with referral. Order placed.   Oncology Nurse Navigator Documentation     11/19/2022    9:30 AM  Oncology Nurse Navigator Flowsheets  Phase of Treatment Radiation  Radiation Actual Start Date: 11/18/2022  Radiation Expected End Date: 12/16/2022  Navigator Location [facility]  Navigator Encounter Type Follow-up Appt  Patient Visit Type MedOnc  Treatment Phase Active Tx  Barriers/Navigation Needs Coordination of Care;Education  Interventions Psycho-Social Support;Referrals  Acuity Level 2-Minimal Needs (1-2 Barriers Identified)  Referrals Genetics  Support Groups/Services Friends and Family  Time Spent with Patient 15

## 2022-11-19 NOTE — Progress Notes (Signed)
Hematology and Oncology Follow Up Visit  Holly Leach [id number] Aug 16, 1970 52 y.o. 11/19/2022   Principle Diagnosis:  Stage IIA (T2N0M0) infiltrating ductal carcinoma of the left breast- ER+/PR-/HER2-  --Oncotype score equal 53  Current Therapy:   Lumpectomy on 07/08/2022 Taxotere/Cytoxan-adjuvant therapy-s/p cycle 4/4  -- start on 08/06/2022 --completed on 10/15/2022     Interim History:  Holly Leach is back for follow-up.  Holly Leach is doing pretty well.  Holly Leach starts radiation therapy I think today.  I am sure that Holly Leach will do quite well with radiation therapy.  Holly Leach says little bit of stiffness in the left shoulder.  Holly Leach did have some blood work done by Holly Leach family doctor.  They show that there was an elevated ferritin.  I suspect this might be more inflammatory.  We are rechecking Holly Leach iron studies.  Holly Leach has had no change in bowel or bladder habits.  Holly Leach has had no cough or shortness of breath.  Holly Leach has had no bleeding or bruising.  There is been no leg swelling.  Overall, I would say that Holly Leach performance status is probably ECOG 1.      Medications:  Current Outpatient Medications:    ALPRAZolam (XANAX) 0.25 MG tablet, Take 1 tablet (0.25 mg total) by mouth at bedtime as needed for anxiety., Disp: 30 tablet, Rfl: 0   Bacillus Coagulans-Inulin (PROBIOTIC-PREBIOTIC PO), Take 2.9 g by mouth daily., Disp: , Rfl:    carvedilol (COREG) 3.125 MG tablet, Take 3.125 mg by mouth 2 (two) times daily., Disp: , Rfl:    COLLAGEN PO, Take 1 tablet by mouth daily. New skin collagen, Disp: , Rfl:    famotidine (PEPCID) 40 MG tablet, Take 40 mg by mouth daily., Disp: , Rfl:    fluticasone (FLONASE) 50 MCG/ACT nasal spray, Place 2 sprays into both nostrils daily as needed for allergies., Disp: , Rfl:    Levothyroxine Sodium 25 MCG CAPS, Take 25 mcg by mouth daily before breakfast., Disp: , Rfl:    lidocaine-prilocaine (EMLA) cream, Apply a dime size to port-a-cath 1-2 hours prior to access. Cover with [employer] Disp: 30 g, Rfl: 2   lisinopril-hydrochlorothiazide (ZESTORETIC) 20-25 MG tablet, Take 1 tablet by mouth 2 (two) times daily., Disp: , Rfl:    loratadine (CLARITIN) 10 MG tablet, Take 10 mg by mouth daily., Disp: , Rfl:    megestrol (MEGACE) 20 MG tablet, Take 1 tablet (20 mg total) by mouth daily. Take 1 tablet (20 mg total) by mouth daily for hot flashes., Disp: 30 tablet, Rfl: 5   Melatonin 10 MG TABS, Take 10 mg by mouth at bedtime., Disp: , Rfl:    montelukast (SINGULAIR) 10 MG tablet, Take 10 mg by mouth daily., Disp: , Rfl:    ondansetron (ZOFRAN) 8 MG tablet, Take 1 tablet (8 mg total) by mouth every 8 (eight) hours as needed for nausea or vomiting. Start on the third day after chemotherapy. (Patient not taking: Reported on 11/19/2022), Disp: 30 tablet, Rfl: 1   prochlorperazine (COMPAZINE) 10 MG tablet, Take 1 tablet (10 mg total) by mouth every 6 (six) hours as needed for nausea or vomiting. (Patient not taking: Reported on 11/19/2022), Disp: 30 tablet, Rfl: 1   UNABLE TO FIND, Cranial Prosthesis (Patient not taking: Reported on 11/19/2022), Disp: 1 Units, Rfl: 0   Vitamin D, Ergocalciferol, (DRISDOL) 1.25 MG (50000 UNIT) CAPS capsule, Take 50,000 Units by mouth once a week. (Patient not taking: Reported on 11/19/2022), Disp: , Rfl:   Allergies:  Allergies  Allergen Reactions   Augmentin [Amoxicillin-Pot Clavulanate] Diarrhea    Yeast Infection    Past Medical History, Surgical history, Social history, and Family History were reviewed and updated.  Review of Systems: Review of Systems  Constitutional: Negative.   HENT:  Negative.    Eyes: Negative.   Respiratory: Negative.    Cardiovascular: Negative.   Gastrointestinal: Negative.   Endocrine: Negative.   Genitourinary: Negative.    Musculoskeletal: Negative.   Skin: Negative.   Neurological: Negative.   Hematological: Negative.   Psychiatric/Behavioral: Negative.      Physical Exam:  height is 5\' 3"  (1.6 m) and  weight is 224 lb (101.6 kg). Holly Leach oral temperature is 97.9 F (36.6 C). Holly Leach blood pressure is 95/56 (abnormal) and Holly Leach pulse is 93. Holly Leach respiration is 17 and oxygen saturation is 99%.   Wt Readings from Last 3 Encounters:  11/19/22 224 lb (101.6 kg)  11/06/22 225 lb 3.2 oz (102.2 kg)  10/15/22 219 lb (99.3 kg)    Physical Exam Vitals reviewed.  Constitutional:      Comments: Breast exam shows right breast no masses, edema or erythema.  There is no right axillary adenopathy.  Left breast shows the lumpectomy at about the 2 o'clock position.  Holly Leach has a point of tenderness just inferior to the lumpectomy site.  There is an area of firmness that is quite tender.  There is no erythema or swelling.  There is no nipple discharge on the left side.  Holly Leach has no left axillary adenopathy.  HENT:     Head: Normocephalic and atraumatic.  Eyes:     Pupils: Pupils are equal, round, and reactive to light.  Cardiovascular:     Rate and Rhythm: Normal rate and regular rhythm.     Heart sounds: Normal heart sounds.  Pulmonary:     Effort: Pulmonary effort is normal.     Breath sounds: Normal breath sounds.  Abdominal:     General: Bowel sounds are normal.     Palpations: Abdomen is soft.  Musculoskeletal:        General: No tenderness or deformity. Normal range of motion.     Cervical back: Normal range of motion.  Lymphadenopathy:     Cervical: No cervical adenopathy.  Skin:    General: Skin is warm and dry.     Findings: No erythema or rash.  Neurological:     Mental Status: Holly Leach is alert and oriented to person, place, and time.  Psychiatric:        Behavior: Behavior normal.        Thought Content: Thought content normal.        Judgment: Judgment normal.     Lab Results  Component Value Date   WBC 6.1 11/19/2022   HGB 9.7 (L) 11/19/2022   HCT 29.6 (L) 11/19/2022   MCV 91.9 11/19/2022   PLT 280 11/19/2022     Chemistry      Component Value Date/Time   NA 137 11/19/2022 0915   K  4.5 11/19/2022 0915   CL 107 11/19/2022 0915   CO2 20 (L) 11/19/2022 0915   BUN 24 (H) 11/19/2022 0915   CREATININE 1.10 (H) 11/19/2022 0915      Component Value Date/Time   CALCIUM 9.6 11/19/2022 0915   ALKPHOS 59 11/19/2022 0915   AST 15 11/19/2022 0915   ALT 16 11/19/2022 0915   BILITOT 0.4 11/19/2022 0915       Impression and Plan: Holly Leach  is a very nice 52 year old perimenopausal white female.  Holly Leach has a fairly high-grade stage IIa ductal carcinoma of the left breast.  This has a very high Oncotype score.  Has a very high proliferation index.  We will go ahead and with Holly Leach radiation therapy now.  Will start Holly Leach on antiestrogen therapy once radiation is completed.  I will go ahead and get Holly Leach started on Femara once we finish up radiation.  It sounds like Holly Leach will finish radiation therapy in the middle of July.  I will plan to see Holly Leach back in 6 weeks.   Josph Macho, MD 6/20/202410:26 AM

## 2022-11-19 NOTE — Patient Instructions (Signed)

## 2022-11-20 ENCOUNTER — Other Ambulatory Visit: Payer: Self-pay

## 2022-11-20 ENCOUNTER — Ambulatory Visit
Admission: RE | Admit: 2022-11-20 | Discharge: 2022-11-20 | Disposition: A | Discharge status: Home / Self Care | Payer: BC Managed Care – PPO | Source: Ambulatory Visit | Attending: Radiation Oncology | Admitting: Radiation Oncology

## 2022-11-20 DIAGNOSIS — C50412 Malignant neoplasm of upper-outer quadrant of left female breast: Secondary | ICD-10-CM | POA: Diagnosis not present

## 2022-11-20 LAB — RAD ONC ARIA SESSION SUMMARY
Course Elapsed Days: 2
Plan Fractions Treated to Date: 3
Plan Prescribed Dose Per Fraction: 2.67 Gy
Plan Total Fractions Prescribed: 15
Plan Total Prescribed Dose: 40.05 Gy
Reference Point Dosage Given to Date: 8.01 Gy
Reference Point Session Dosage Given: 2.67 Gy
Session Number: 3

## 2022-11-22 LAB — ESTRADIOL, ULTRA SENS: Estradiol, Sensitive: 3.5 pg/mL

## 2022-11-22 LAB — FOLLICLE STIMULATING HORMONE: FSH: 26.8 m[IU]/mL

## 2022-11-22 LAB — LUTEINIZING HORMONE: LH: 31.7 m[IU]/mL

## 2022-11-23 ENCOUNTER — Other Ambulatory Visit: Payer: Self-pay

## 2022-11-23 ENCOUNTER — Ambulatory Visit
Admission: RE | Admit: 2022-11-23 | Discharge: 2022-11-23 | Disposition: A | Discharge status: Home / Self Care | Payer: BC Managed Care – PPO | Source: Ambulatory Visit | Attending: Radiation Oncology | Admitting: Radiation Oncology

## 2022-11-23 DIAGNOSIS — C50912 Malignant neoplasm of unspecified site of left female breast: Secondary | ICD-10-CM

## 2022-11-23 DIAGNOSIS — C50412 Malignant neoplasm of upper-outer quadrant of left female breast: Secondary | ICD-10-CM | POA: Diagnosis not present

## 2022-11-23 LAB — RAD ONC ARIA SESSION SUMMARY
Course Elapsed Days: 5
Plan Fractions Treated to Date: 4
Plan Prescribed Dose Per Fraction: 2.67 Gy
Plan Total Fractions Prescribed: 15
Plan Total Prescribed Dose: 40.05 Gy
Reference Point Dosage Given to Date: 10.68 Gy
Reference Point Session Dosage Given: 2.67 Gy
Session Number: 4

## 2022-11-23 MED ORDER — RADIAPLEXRX EX GEL: Freq: Once | CUTANEOUS | Status: AC

## 2022-11-24 ENCOUNTER — Ambulatory Visit: Payer: BC Managed Care – PPO

## 2022-11-25 ENCOUNTER — Ambulatory Visit
Admission: RE | Admit: 2022-11-25 | Discharge: 2022-11-25 | Disposition: A | Discharge status: Home / Self Care | Payer: BC Managed Care – PPO | Source: Ambulatory Visit | Attending: Radiation Oncology | Admitting: Radiation Oncology

## 2022-11-25 ENCOUNTER — Other Ambulatory Visit: Payer: Self-pay

## 2022-11-25 DIAGNOSIS — C50412 Malignant neoplasm of upper-outer quadrant of left female breast: Secondary | ICD-10-CM | POA: Diagnosis not present

## 2022-11-25 LAB — RAD ONC ARIA SESSION SUMMARY
Course Elapsed Days: 7
Plan Fractions Treated to Date: 5
Plan Prescribed Dose Per Fraction: 2.67 Gy
Plan Total Fractions Prescribed: 15
Plan Total Prescribed Dose: 40.05 Gy
Reference Point Dosage Given to Date: 13.35 Gy
Reference Point Session Dosage Given: 2.67 Gy
Session Number: 5

## 2022-11-26 ENCOUNTER — Other Ambulatory Visit: Payer: Self-pay

## 2022-11-26 ENCOUNTER — Ambulatory Visit
Admission: RE | Admit: 2022-11-26 | Discharge: 2022-11-26 | Disposition: A | Discharge status: Home / Self Care | Payer: BC Managed Care – PPO | Source: Ambulatory Visit | Attending: Radiation Oncology | Admitting: Radiation Oncology

## 2022-11-26 ENCOUNTER — Ambulatory Visit: Payer: BC Managed Care – PPO | Attending: Internal Medicine | Admitting: Internal Medicine

## 2022-11-26 ENCOUNTER — Encounter: Payer: Self-pay | Admitting: Internal Medicine

## 2022-11-26 VITALS — BP 112/72 | HR 97 | Ht 63.0 in | Wt 225.6 lb

## 2022-11-26 DIAGNOSIS — C50412 Malignant neoplasm of upper-outer quadrant of left female breast: Secondary | ICD-10-CM | POA: Diagnosis not present

## 2022-11-26 DIAGNOSIS — Z9221 Personal history of antineoplastic chemotherapy: Secondary | ICD-10-CM | POA: Diagnosis not present

## 2022-11-26 DIAGNOSIS — R0602 Shortness of breath: Secondary | ICD-10-CM | POA: Diagnosis not present

## 2022-11-26 LAB — RAD ONC ARIA SESSION SUMMARY
Course Elapsed Days: 8
Plan Fractions Treated to Date: 6
Plan Prescribed Dose Per Fraction: 2.67 Gy
Plan Total Fractions Prescribed: 15
Plan Total Prescribed Dose: 40.05 Gy
Reference Point Dosage Given to Date: 16.02 Gy
Reference Point Session Dosage Given: 2.67 Gy
Session Number: 6

## 2022-11-26 NOTE — Progress Notes (Signed)
Cardiology Office Note:    Date:  11/27/2022   ID:  Holly Leach, DOB [birth year], MRN [id number]  PCP:  Dois Davenport, MD   [facility] Providers Cardiologist:  Maisie Fus, MD     Referring MD: Dois Davenport, MD   No chief complaint on file. SOB  History of Present Illness:    Holly Leach is a 52 y.o. female with a hx of Stage IIA L breast CA ER+/PR-Her2- s/p Taxotere/cytoxan-adjuvant therapy-s/p cycle 4/4 , will undergo left sided radiation,  managed on antiestrogen therapy, obesity who was referred to cardio oncology clinic for SOB Since chemo she has been short of breath with activity. She notes she was at [facility] in the parking lot and felt winded. She denies chest pain. She has no hx of CAD. No cardiac w/u prior. Her EKG in late January was normal  She is eating Chick filet for breakfast. She wants to change to oatmeal and interested in [employer]    Cardio-Onc Hx - CVD Risk/ABCDE: DM2, A1c 5.6%. TC 267, LDL 185. Non Smoker. Obesity - Anthracycline therapy - No - Herceptin- No - Radiation Planned for L breast - ICI- no - TKI- no    The 10-year ASCVD risk score (Arnett DK, et al., 2019) is: 3.7%   Values used to calculate the score:     Age: 82 years     Sex: Female     Is Non-Hispanic African American: No     Diabetic: Yes     Tobacco smoker: No     Systolic Blood Pressure: 112 mmHg     Is BP treated: Yes     HDL Cholesterol: 54 mg/dL     Total Cholesterol: 253 mg/dL    Past Medical History:  Diagnosis Date   ACL tear    right knee   Cancer (HCC)    Chronic headaches    Diabetes mellitus without complication (HCC)    GERD (gastroesophageal reflux disease)    d/t trulicity   Hyperlipidemia    Hypertension    Hypothyroidism    OSA on CPAP    PE (pulmonary embolism) 2006   Pre-diabetes    Stage II breast cancer, left (HCC) 07/27/2022   Thyroid disease    Tuberculosis    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    Past Surgical History:  Procedure Laterality Date   BREAST BIOPSY Left 06/19/2022   [place] LT BREAST BX W LOC DEV 1ST LESION IMG BX SPEC US GUIDE 06/19/2022 [facility]   BREAST BIOPSY  07/07/2022   MM LT RADIOACTIVE SEED LOC MAMMO GUIDE 07/07/2022 [facility]   BREAST LUMPECTOMY WITH RADIOACTIVE SEED AND SENTINEL LYMPH NODE BIOPSY Left 07/08/2022   Procedure: LEFT BREAST LUMPECTOMY WITH RADIOACTIVE SEED AND SENTINEL LYMPH NODE BIOPSY;  Surgeon: Almond Lint, MD;  Location: [facility];  Service: General;  Laterality: Left;   COLONOSCOPY     COLPOSCOPY  2006   FINE NEEDLE ASPIRATION Left 07/28/2022   Procedure: ASPIRATION OF LEFT BREAST SEROMA;  Surgeon: Almond Lint, MD;  Location: [facility] OR;  Service: General;  Laterality: Left;   FRACTURE SURGERY Left 2020   foot - pt denies having surgery for this   HERNIA REPAIR     PORTACATH PLACEMENT Left 07/28/2022   Procedure: INSERTION PORT-A-CATH WITH ULTRASOUND GUIDANCE;  Surgeon: Almond Lint, MD;  Location: [facility] OR;  Service: General;  Laterality: Left;    Current  Medications: Current Meds  Medication Sig   ALPRAZolam (XANAX) 0.25 MG tablet Take 1 tablet (0.25 mg total) by mouth at bedtime as needed for anxiety.   Bacillus Coagulans-Inulin (PROBIOTIC-PREBIOTIC PO) Take 2.9 g by mouth daily.   carvedilol (COREG) 3.125 MG tablet Take 3.125 mg by mouth 2 (two) times daily.   COLLAGEN PO Take 1 tablet by mouth daily. New skin collagen   fluticasone (FLONASE) 50 MCG/ACT nasal spray Place 2 sprays into both nostrils daily as needed for allergies.   Levothyroxine Sodium 25 MCG CAPS Take 25 mcg by mouth daily before breakfast.   lidocaine-prilocaine (EMLA) cream Apply a dime size to port-a-cath 1-2 hours prior to access. Cover with Warren Lacy.   lisinopril-hydrochlorothiazide (ZESTORETIC) 20-25 MG tablet Take 1 tablet by mouth 2 (two) times daily.   loratadine (CLARITIN) 10 MG tablet Take 10 mg by mouth daily.   megestrol (MEGACE) 20  MG tablet Take 1 tablet (20 mg total) by mouth daily. Take 1 tablet (20 mg total) by mouth daily for hot flashes.   Melatonin 10 MG TABS Take 10 mg by mouth at bedtime.   montelukast (SINGULAIR) 10 MG tablet Take 10 mg by mouth daily.   ondansetron (ZOFRAN) 8 MG tablet Take 1 tablet (8 mg total) by mouth every 8 (eight) hours as needed for nausea or vomiting. Start on the third day after chemotherapy.   prochlorperazine (COMPAZINE) 10 MG tablet Take 1 tablet (10 mg total) by mouth every 6 (six) hours as needed for nausea or vomiting.   UNABLE TO FIND Cranial Prosthesis   Vitamin D, Ergocalciferol, (DRISDOL) 1.25 MG (50000 UNIT) CAPS capsule Take 50,000 Units by mouth once a week.     Allergies:   Augmentin [amoxicillin-pot clavulanate]   Social History   Socioeconomic History   Marital status: Married    Spouse name: Not on file   Number of children: 1   Years of education: Not on file   Highest education level: Not on file  Occupational History   Occupation: Environmental health practitioner  Tobacco Use   Smoking status: Never   Smokeless tobacco: Never  Vaping Use   Vaping Use: Never used  Substance and Sexual Activity   Alcohol use: No    Alcohol/week: 0.0 standard drinks of alcohol   Drug use: No   Sexual activity: Yes  Other Topics Concern   Not on file  Social History Narrative   Not on file   Social Determinants of Health   Financial Resource Strain: Medium Risk (07/23/2022)   Overall Financial Resource Strain (CARDIA)    Difficulty of Paying Living Expenses: Somewhat hard  Food Insecurity: No Food Insecurity (06/30/2022)   Hunger Vital Sign    Worried About Running Out of Food in the Last Year: Never true    Ran Out of Food in the Last Year: Never true  Transportation Needs: No Transportation Needs (06/30/2022)   PRAPARE - Administrator, Civil Service (Medical): No    Lack of Transportation (Non-Medical): No  Physical Activity: Not on file  Stress: Not on  file  Social Connections: Socially Integrated (07/23/2022)   Social Connection and Isolation Panel [NHANES]    Frequency of Communication with Friends and Family: More than three times a week    Frequency of Social Gatherings with Friends and Family: More than three times a week    Attends Religious Services: More than 4 times per year    Active Member of [employer] or Organizations: Yes  Attends Engineer, structural: More than 4 times per year    Marital Status: Married     Family History: The patient's family history includes Breast cancer in an other family member; Diabetes in her paternal grandmother; Heart disease in her father; Hyperlipidemia in her brother; Hypertension in her brother, father, and mother; Irritable bowel syndrome in her maternal aunt and sister; Stomach cancer in her maternal grandfather; Stroke in her mother. There is no history of Colon cancer, Colon polyps, Esophageal cancer, or Rectal cancer. Mother had a stroke  ROS:   Please see the history of present illness.     All other systems reviewed and are negative.  EKGs/Labs/Other Studies Reviewed:    The following studies were reviewed today:   Recent Labs: 10/15/2022: Magnesium 1.9 11/19/2022: ALT 16; BUN 24; Creatinine 1.10; Hemoglobin 9.7; Platelet Count 280; Potassium 4.5; Sodium 137  Recent Lipid Panel    Component Value Date/Time   CHOL 253 (H) 09/24/2022 0935   TRIG 298 (H) 09/24/2022 0935   HDL 54 09/24/2022 0935   CHOLHDL 4.7 09/24/2022 0935   VLDL 60 (H) 09/24/2022 0935   LDLCALC 139 (H) 09/24/2022 0935     Risk Assessment/Calculations:     Physical Exam:    VS:  BP 112/72 (BP Location: Right Arm, Patient Position: Sitting, Cuff Size: Large)   Pulse 97   Ht 5\' 3"  (1.6 m)   Wt 225 lb 9.6 oz (102.3 kg)   SpO2 98%   BMI 39.96 kg/m     Wt Readings from Last 3 Encounters:  11/26/22 225 lb 9.6 oz (102.3 kg)  11/19/22 224 lb (101.6 kg)  11/06/22 225 lb 3.2 oz (102.2 kg)     GEN:   Well nourished, well developed in no acute distress HEENT: Normal CARDIAC: RRR, no murmurs, rubs, gallops RESPIRATORY:  Clear to auscultation without rales, wheezing or rhonchi  ABDOMEN: Soft, non-tender, non-distended MUSCULOSKELETAL:  No edema; No deformity  SKIN: Warm and dry NEUROLOGIC:  Alert and oriented x 3 PSYCHIATRIC:  Normal affect   ASSESSMENT:    SOB - Ddx includes HFpEF vs. deconditioning v. CAD. She has no signs of CHF on exam. She has low risk factors for CAD and no signs of ischemia on her EKG. Can be deconditioning. We discussed healthy diet and importance of exercise. Otherwise, will get a TTE to assess for LV function   Cardio-Oncology - low risk for CTRCD PLAN:    In order of problems listed above:  TTE Follow up 3 months     Medication Adjustments/Labs and Tests Ordered: Current medicines are reviewed at length with the patient today.  Concerns regarding medicines are outlined above.  Orders Placed This Encounter  Procedures   ECHOCARDIOGRAM COMPLETE   No orders of the defined types were placed in this encounter.   Patient Instructions  Medication Instructions:  No changes  *If you need a refill on your cardiac medications before your next appointment, please call your pharmacy*   Lab Work: None    Testing/Procedures: An echocardiogram has been ordered for you.   Follow-Up: At [facility], you and your health needs are our priority.  As part of our continuing mission to provide you with exceptional heart care, we have created designated Provider Care Teams.  These Care Teams include your primary Cardiologist (physician) and Advanced Practice Providers (APPs -  Physician Assistants and Nurse Practitioners) who all work together to provide you with the care you need, when you need  it.  We recommend signing up for the patient portal called "MyChart".  Sign up information is provided on this After Visit Summary.  MyChart is used to  connect with patients for Virtual Visits (Telemedicine).  Patients are able to view lab/test results, encounter notes, upcoming appointments, etc.  Non-urgent messages can be sent to your provider as well.   To learn more about what you can do with MyChart, go to [email or web address].    Your next appointment:   3 month(s)  Provider:   Maisie Fus, MD      Signed, Maisie Fus, MD  11/27/2022 7:04 PM    [facility]

## 2022-11-26 NOTE — Patient Instructions (Signed)
Medication Instructions:  No changes  *If you need a refill on your cardiac medications before your next appointment, please call your pharmacy*   Lab Work: None    Testing/Procedures: An echocardiogram has been ordered for you.   Follow-Up: At [facility], you and your health needs are our priority.  As part of our continuing mission to provide you with exceptional heart care, we have created designated Provider Care Teams.  These Care Teams include your primary Cardiologist (physician) and Advanced Practice Providers (APPs -  Physician Assistants and Nurse Practitioners) who all work together to provide you with the care you need, when you need it.  We recommend signing up for the patient portal called "MyChart".  Sign up information is provided on this After Visit Summary.  MyChart is used to connect with patients for Virtual Visits (Telemedicine).  Patients are able to view lab/test results, encounter notes, upcoming appointments, etc.  Non-urgent messages can be sent to your provider as well.   To learn more about what you can do with MyChart, go to [email or web address].    Your next appointment:   3 month(s)  Provider:   Maisie Fus, MD

## 2022-11-27 ENCOUNTER — Ambulatory Visit
Admission: RE | Admit: 2022-11-27 | Discharge: 2022-11-27 | Disposition: A | Discharge status: Home / Self Care | Payer: BC Managed Care – PPO | Source: Ambulatory Visit | Attending: Radiation Oncology | Admitting: Radiation Oncology

## 2022-11-27 ENCOUNTER — Other Ambulatory Visit: Payer: Self-pay

## 2022-11-27 ENCOUNTER — Telehealth: Payer: Self-pay | Admitting: *Deleted

## 2022-11-27 DIAGNOSIS — C50412 Malignant neoplasm of upper-outer quadrant of left female breast: Secondary | ICD-10-CM | POA: Diagnosis not present

## 2022-11-27 LAB — RAD ONC ARIA SESSION SUMMARY
Course Elapsed Days: 9
Plan Fractions Treated to Date: 7
Plan Prescribed Dose Per Fraction: 2.67 Gy
Plan Total Fractions Prescribed: 15
Plan Total Prescribed Dose: 40.05 Gy
Reference Point Dosage Given to Date: 18.69 Gy
Reference Point Session Dosage Given: 2.67 Gy
Session Number: 7

## 2022-11-27 NOTE — Telephone Encounter (Signed)
Spoke with the patient regarding the referral to GYN oncology. Patient scheduled as new patient with Dr Alvester Morin 7/22 @ 3 pm. Patient given an arrival time of 2:45 pm.  Explained to the patient the the doctor will perform a pelvic exam at this visit. Patient given the policy that only one visitor allowed and that visitor must be over 16 yrs are allowed in the Cancer Center. Patient given the address/phone number for the clinic and that the center offers free valet service. Patient aware that masks are option.

## 2022-11-28 ENCOUNTER — Other Ambulatory Visit: Payer: Self-pay

## 2022-11-30 ENCOUNTER — Ambulatory Visit
Admission: RE | Admit: 2022-11-30 | Discharge: 2022-11-30 | Disposition: A | Discharge status: Home / Self Care | Payer: BC Managed Care – PPO | Source: Ambulatory Visit | Attending: Radiation Oncology | Admitting: Radiation Oncology

## 2022-11-30 ENCOUNTER — Other Ambulatory Visit: Payer: Self-pay

## 2022-11-30 DIAGNOSIS — E010 Iodine-deficiency related diffuse (endemic) goiter: Secondary | ICD-10-CM | POA: Insufficient documentation

## 2022-11-30 DIAGNOSIS — C50412 Malignant neoplasm of upper-outer quadrant of left female breast: Secondary | ICD-10-CM | POA: Insufficient documentation

## 2022-11-30 DIAGNOSIS — Z510 Encounter for antineoplastic radiation therapy: Secondary | ICD-10-CM | POA: Insufficient documentation

## 2022-11-30 LAB — RAD ONC ARIA SESSION SUMMARY
Course Elapsed Days: 12
Plan Fractions Treated to Date: 8
Plan Prescribed Dose Per Fraction: 2.67 Gy
Plan Total Fractions Prescribed: 15
Plan Total Prescribed Dose: 40.05 Gy
Reference Point Dosage Given to Date: 21.36 Gy
Reference Point Session Dosage Given: 2.67 Gy
Session Number: 8

## 2022-12-01 ENCOUNTER — Ambulatory Visit
Admission: RE | Admit: 2022-12-01 | Discharge: 2022-12-01 | Disposition: A | Discharge status: Home / Self Care | Payer: BC Managed Care – PPO | Source: Ambulatory Visit | Attending: Radiation Oncology | Admitting: Radiation Oncology

## 2022-12-01 ENCOUNTER — Other Ambulatory Visit: Payer: Self-pay

## 2022-12-01 DIAGNOSIS — C50912 Malignant neoplasm of unspecified site of left female breast: Secondary | ICD-10-CM

## 2022-12-01 DIAGNOSIS — C50412 Malignant neoplasm of upper-outer quadrant of left female breast: Secondary | ICD-10-CM | POA: Diagnosis not present

## 2022-12-01 LAB — RAD ONC ARIA SESSION SUMMARY
Course Elapsed Days: 13
Plan Fractions Treated to Date: 9
Plan Prescribed Dose Per Fraction: 2.67 Gy
Plan Total Fractions Prescribed: 15
Plan Total Prescribed Dose: 40.05 Gy
Reference Point Dosage Given to Date: 24.03 Gy
Reference Point Session Dosage Given: 2.67 Gy
Session Number: 9

## 2022-12-01 MED ORDER — ALRA NON-METALLIC DEODORANT (RAD-ONC)
1.0000 | Freq: Once | TOPICAL | Status: AC
  Administered 2022-12-01: 1 via TOPICAL

## 2022-12-02 ENCOUNTER — Other Ambulatory Visit: Payer: Self-pay

## 2022-12-02 ENCOUNTER — Encounter: Payer: Self-pay | Admitting: Genetic Counselor

## 2022-12-02 ENCOUNTER — Ambulatory Visit
Admission: RE | Admit: 2022-12-02 | Discharge: 2022-12-02 | Disposition: A | Discharge status: Home / Self Care | Payer: BC Managed Care – PPO | Source: Ambulatory Visit | Attending: Radiation Oncology | Admitting: Radiation Oncology

## 2022-12-02 DIAGNOSIS — E010 Iodine-deficiency related diffuse (endemic) goiter: Secondary | ICD-10-CM

## 2022-12-02 DIAGNOSIS — C50412 Malignant neoplasm of upper-outer quadrant of left female breast: Secondary | ICD-10-CM | POA: Diagnosis not present

## 2022-12-02 DIAGNOSIS — C50912 Malignant neoplasm of unspecified site of left female breast: Secondary | ICD-10-CM

## 2022-12-02 LAB — RAD ONC ARIA SESSION SUMMARY
Course Elapsed Days: 14
Plan Fractions Treated to Date: 10
Plan Prescribed Dose Per Fraction: 2.67 Gy
Plan Total Fractions Prescribed: 15
Plan Total Prescribed Dose: 40.05 Gy
Reference Point Dosage Given to Date: 26.7 Gy
Reference Point Session Dosage Given: 2.67 Gy
Session Number: 10

## 2022-12-04 ENCOUNTER — Ambulatory Visit
Admission: RE | Admit: 2022-12-04 | Discharge: 2022-12-04 | Disposition: A | Discharge status: Home / Self Care | Payer: BC Managed Care – PPO | Source: Ambulatory Visit | Attending: Radiation Oncology | Admitting: Radiation Oncology

## 2022-12-04 ENCOUNTER — Other Ambulatory Visit: Payer: Self-pay

## 2022-12-04 DIAGNOSIS — C50412 Malignant neoplasm of upper-outer quadrant of left female breast: Secondary | ICD-10-CM | POA: Diagnosis not present

## 2022-12-04 DIAGNOSIS — C50912 Malignant neoplasm of unspecified site of left female breast: Secondary | ICD-10-CM

## 2022-12-04 LAB — RAD ONC ARIA SESSION SUMMARY
Course Elapsed Days: 16
Plan Fractions Treated to Date: 11
Plan Prescribed Dose Per Fraction: 2.67 Gy
Plan Total Fractions Prescribed: 15
Plan Total Prescribed Dose: 40.05 Gy
Reference Point Dosage Given to Date: 29.37 Gy
Reference Point Session Dosage Given: 2.67 Gy
Session Number: 11

## 2022-12-04 MED ORDER — RADIAPLEXRX EX GEL: Freq: Once | CUTANEOUS | Status: AC

## 2022-12-07 ENCOUNTER — Other Ambulatory Visit: Payer: Self-pay

## 2022-12-07 ENCOUNTER — Ambulatory Visit
Admission: RE | Admit: 2022-12-07 | Discharge: 2022-12-07 | Disposition: A | Discharge status: Home / Self Care | Payer: BC Managed Care – PPO | Source: Ambulatory Visit | Attending: Radiation Oncology | Admitting: Radiation Oncology

## 2022-12-07 DIAGNOSIS — C50412 Malignant neoplasm of upper-outer quadrant of left female breast: Secondary | ICD-10-CM | POA: Diagnosis not present

## 2022-12-07 LAB — RAD ONC ARIA SESSION SUMMARY
Course Elapsed Days: 19
Plan Fractions Treated to Date: 12
Plan Prescribed Dose Per Fraction: 2.67 Gy
Plan Total Fractions Prescribed: 15
Plan Total Prescribed Dose: 40.05 Gy
Reference Point Dosage Given to Date: 32.04 Gy
Reference Point Session Dosage Given: 2.67 Gy
Session Number: 12

## 2022-12-08 ENCOUNTER — Ambulatory Visit: Payer: BC Managed Care – PPO

## 2022-12-08 ENCOUNTER — Ambulatory Visit: Admission: RE | Admit: 2022-12-08 | Payer: BC Managed Care – PPO | Source: Ambulatory Visit

## 2022-12-08 ENCOUNTER — Other Ambulatory Visit: Payer: Self-pay

## 2022-12-08 DIAGNOSIS — C50412 Malignant neoplasm of upper-outer quadrant of left female breast: Secondary | ICD-10-CM | POA: Diagnosis not present

## 2022-12-08 LAB — RAD ONC ARIA SESSION SUMMARY
Course Elapsed Days: 20
Plan Fractions Treated to Date: 13
Plan Prescribed Dose Per Fraction: 2.67 Gy
Plan Total Fractions Prescribed: 15
Plan Total Prescribed Dose: 40.05 Gy
Reference Point Dosage Given to Date: 34.71 Gy
Reference Point Session Dosage Given: 2.67 Gy
Session Number: 13

## 2022-12-08 NOTE — Therapy (Signed)
OUTPATIENT PHYSICAL THERAPY  UPPER EXTREMITY ONCOLOGY EVALUATION  Patient Name: Holly Leach MRN: [id number] DOB:1970/12/25, 52 y.o., female Today's Date: 12/08/2022  END OF SESSION:   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   Past Surgical History:  Procedure Laterality Date   BREAST BIOPSY Left 06/19/2022   [place] LT BREAST BX W LOC DEV 1ST LESION IMG BX SPEC US GUIDE 06/19/2022 [facility]   BREAST BIOPSY  07/07/2022   MM LT RADIOACTIVE SEED LOC MAMMO GUIDE 07/07/2022 [facility]   BREAST LUMPECTOMY WITH RADIOACTIVE SEED AND SENTINEL LYMPH NODE BIOPSY Left 07/08/2022   Procedure: LEFT BREAST LUMPECTOMY WITH RADIOACTIVE SEED AND SENTINEL LYMPH NODE BIOPSY;  Surgeon: Almond Lint, MD;  Location: [facility];  Service: General;  Laterality: Left;   COLONOSCOPY     COLPOSCOPY  2006   FINE NEEDLE ASPIRATION Left 07/28/2022   Procedure: ASPIRATION OF LEFT BREAST SEROMA;  Surgeon: Almond Lint, MD;  Location: [facility];  Service: General;  Laterality: Left;   FRACTURE SURGERY Left 2020   foot - pt denies having surgery for this   HERNIA REPAIR     PORTACATH PLACEMENT Left 07/28/2022   Procedure: INSERTION PORT-A-CATH WITH ULTRASOUND GUIDANCE;  Surgeon: Almond Lint, MD;  Location: [facility];  Service: General;  Laterality: Left;   Patient Active Problem List   Diagnosis Date Noted   Stage II breast cancer, left (HCC) 07/27/2022   Neck mass 11/03/2013   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/03/2013    PCP: Nadyne Coombes, MD  REFERRING PROVIDER: Dr. Lonie Peak  REFERRING  DIAG: s/p Left Breast Cancer with cording  THERAPY DIAG:  No diagnosis found.  ONSET DATE: November 02, 2022  Rationale for Evaluation and Treatment: Rehabilitation  SUBJECTIVE:                                                                                                                                                                                           SUBJECTIVE STATEMENT:  When I try to position for radiation it is tight in my axilla and a little tender.  I noticed it about a week ago. If I reach I feel tight under the arm. Hurting from radiation and her skin is red with little blisters.  PERTINENT HISTORY:  Patient was diagnosed on 06/19/22 with left grade 2. It measures 1.4 x1.3 x 1.1 cm and is located in the upper outer quadrant. It is ER+, PR-, HER2-  with a Ki67 of 85%. Pt underwent a  L breast lumpectomy and SLNB on 07/08/22 with 0+/3 LN, followed by 4 cycles of chemo and is presently having radiation.. She has 7 more XRT left.  PAIN:  Are you having pain? Yes NPRS scale: 0-5/10 Pain location: left Axilla Pain orientation: Left  PAIN TYPE: tight Pain description: intermittent  Aggravating factors: reaching, washing under arm Relieving factors: not reaching  PRECAUTIONS: Left UE lymphedema risk, right ACL tear  WEIGHT BEARING RESTRICTIONS: No  FALLS:  Has patient fallen in last 6 months? No  LIVING ENVIRONMENT: Lives with: lives with their spouse Lives in: House/apartment Has following equipment at home: None  OCCUPATION: Marine scientist at [employer]  LEISURE: Support Group  HAND DOMINANCE: right   PRIOR LEVEL OF FUNCTION: Independent  PATIENT GOALS: Decrease pain in axilla, and be able to reach better   OBJECTIVE:  COGNITION: Overall cognitive status: Within functional limits for tasks assessed   PALPATION: Fibrosis noted inferior to incision  OBSERVATIONS / OTHER ASSESSMENTS: no visible or palpable cording presently, significant redness from radiation   especially in left axillary/lateral trunk region, and with lighter redness throughout entire breast and beginnings of small blisters under the arm at axillary incision. Fibrosis noted around incision area of left breast  SENSATION: Light touch: Deficits    POSTURE: forward head, rounded shoulders  UPPER EXTREMITY AROM/PROM:  A/PROM RIGHT   eval   Shoulder extension 48  Shoulder flexion 159  Shoulder abduction 160  Shoulder internal rotation 55+  Shoulder external rotation 98    (Blank rows = not tested)  A/PROM LEFT   eval  Shoulder extension 40  Shoulder flexion 126/152 after stretching  Shoulder abduction 105/ 125 after stretching  Shoulder internal rotation 54  Shoulder external rotation 77    (Blank rows = not tested)  CERVICAL AROM: All within functional limits:     UPPER EXTREMITY STRENGTH:   LYMPHEDEMA ASSESSMENTS:   SURGERY TYPE/DATE: 07/08/22 Left Lumpectomy with SLNB  NUMBER OF LYMPH NODES REMOVED: 0/3  CHEMOTHERAPY: YES 08/07/22 4 treatments every 3 weeks  RADIATION:Yes, 7 treatments left  HORMONE TREATMENT: Pending  INFECTIONS: NO   LYMPHEDEMA ASSESSMENTS:   LANDMARK RIGHT  eval  At axilla    15 cm proximal to olecranon process   10 cm proximal to olecranon process 39.4  Olecranon process 30.9  15 cm proximal to ulnar styloid process   10 cm proximal to ulnar styloid process 27.5  Just proximal to ulnar styloid process 18.7  Across hand at thumb web space 19.3  At base of 2nd digit 6.5  (Blank rows = not tested)  LANDMARK LEFT  eval  At axilla    15 cm proximal to olecranon process   10 cm proximal to olecranon process 39  Olecranon process 30.4  15 cm proximal to ulnar styloid process   10 cm proximal to ulnar styloid process 26.4  Just proximal to ulnar styloid process 16.8  Across hand at thumb web space 19.3  At base of 2nd digit 6.5  (Blank rows = not tested)     GAIT: Distance walked: 40 Assistive device  utilized:  None Level of assistance: Complete Independence Comments: WNL  L-DEX LYMPHEDEMA SCREENING: The patient was assessed using the L-Dex machine today to produce a lymphedema index baseline score. The patient will be reassessed on a regular basis (typically every 3 months) to obtain new L-Dex scores. If the score is > 6.5 points away from his/her baseline score indicating onset of subclinical lymphedema, it will be recommended to wear a compression garment for 4 weeks, 12 hours per day and then be reassessed. If the score continues to be > 6.5 points from baseline at reassessment, we will initiate lymphedema treatment. Assessing in this manner has a 95% rate of preventing clinically significant lymphedema.  QUICK DASH SURVEY: 32%   TODAY'S TREATMENT:                                                                                                                                          DATE:  12/09/22   PATIENT EDUCATION:  Education details: reviewed 4 post op exercises, initiated wand flexion and scaption, reviewed star gazer and wall slides all x 5 Person educated: Patient Education method: Explanation and Handouts Education comprehension: verbalized understanding and returned demonstration  HOME EXERCISE PROGRAM: Supine wand flex and scaption, stargazer and scap. Retraction. Discussed compression bra when radiation is over and skin is less tender  ASSESSMENT:  CLINICAL IMPRESSION: Patient is a 52 y.o. female who was seen today for physical therapy evaluation and treatment for complaints of tightness and tenderness in the left axillary region. Pt was assessed for cording and no cords visible or palpable presently, however, pt does have significant limitations in left shoulder ROM likely due to radiation. Axillary region is with significant redness, with mild redness throughout the left breast from radiation. She has 7 radiation treatments left.. She.  was instructed in AA shoulder ROM  exercises and her ROM improved a great deal. She was scheduled for 1 visit next week which she may cancel if her ROM is back to normal. We will follow up with her again after radiation to assess for breast swelling and shoulder ROM at a time when she will be more tolerant of treatment as she is very burned presently. She will benefit from skilled PT to addres deficits and return to PLOF.   OBJECTIVE IMPAIRMENTS: decreased activity tolerance, decreased knowledge of condition, decreased ROM, decreased strength, impaired UE functional use, and postural dysfunction, pain   ACTIVITY LIMITATIONS: sleeping, reach over head, and hygiene/grooming  PARTICIPATION LIMITATIONS: cleaning, laundry, and shopping  PERSONAL FACTORS: 3+ comorbidities: Left Breast Cancer s/p chemo and radiation  are also affecting patient's functional outcome.   REHAB POTENTIAL: Good  CLINICAL DECISION MAKING: Stable/uncomplicated  EVALUATION COMPLEXITY: Low  GOALS: Goals reviewed with patient? Yes  SHORT TERM GOALS=LONG TEM GOALS: Target date: 01/20/2023  Pt will be independent and compliant with HEP to improve shoulder ROM and strength Baseline: Goal status:  INITIAL  2.  Pts quick dash will be improved to no greater than 10% Baseline:  Goal status: INITIAL  3.  Pt will have left shoulder ROM improved to within 5-10 degrees of right for improved function Baseline:  Goal status: INITIAL  4.  Pt will have decreased pain and tightness by 50% for improved activity tolerance Baseline:  Goal status: INITIAL    PLAN:  PT FREQUENCY: 1-2x/week  PT DURATION: 6 weeks  PLANNED INTERVENTIONS: Therapeutic exercises, Therapeutic activity, Patient/Family education, Self Care, Joint mobilization, scar mobilization, Manual therapy, and Re-evaluation  PLAN FOR NEXT SESSION: Pt scheduled x 1 next week but may cancel if ROM is improved. Will follow up again approx 2 weeks after radiation for tissue to heal and for pt to  tolerate further therapy as needed for ROM or if any breast swelling present. Due for SOZO screen early August., check ROM, check for breast edema, check goals and schedule further visits prn.  Waynette Buttery, PT 12/08/2022, 7:43 AM

## 2022-12-09 ENCOUNTER — Ambulatory Visit: Payer: BC Managed Care – PPO

## 2022-12-09 ENCOUNTER — Other Ambulatory Visit: Payer: Self-pay

## 2022-12-09 ENCOUNTER — Ambulatory Visit: Payer: BC Managed Care – PPO | Attending: Radiation Oncology

## 2022-12-09 DIAGNOSIS — R600 Localized edema: Secondary | ICD-10-CM | POA: Diagnosis present

## 2022-12-09 DIAGNOSIS — C50412 Malignant neoplasm of upper-outer quadrant of left female breast: Secondary | ICD-10-CM | POA: Diagnosis present

## 2022-12-09 DIAGNOSIS — Z170 Estrogen receptor positive status [ER+]: Secondary | ICD-10-CM | POA: Diagnosis present

## 2022-12-09 DIAGNOSIS — M25612 Stiffness of left shoulder, not elsewhere classified: Secondary | ICD-10-CM | POA: Diagnosis present

## 2022-12-09 DIAGNOSIS — R293 Abnormal posture: Secondary | ICD-10-CM | POA: Diagnosis present

## 2022-12-09 DIAGNOSIS — Z483 Aftercare following surgery for neoplasm: Secondary | ICD-10-CM | POA: Insufficient documentation

## 2022-12-09 LAB — RAD ONC ARIA SESSION SUMMARY
Course Elapsed Days: 21
Plan Fractions Treated to Date: 14
Plan Prescribed Dose Per Fraction: 2.67 Gy
Plan Total Fractions Prescribed: 15
Plan Total Prescribed Dose: 40.05 Gy
Reference Point Dosage Given to Date: 37.38 Gy
Reference Point Session Dosage Given: 2.67 Gy
Session Number: 14

## 2022-12-10 ENCOUNTER — Other Ambulatory Visit: Payer: Self-pay

## 2022-12-10 ENCOUNTER — Ambulatory Visit: Payer: BC Managed Care – PPO

## 2022-12-10 ENCOUNTER — Ambulatory Visit: Payer: BC Managed Care – PPO | Admitting: Dietician

## 2022-12-10 DIAGNOSIS — C50412 Malignant neoplasm of upper-outer quadrant of left female breast: Secondary | ICD-10-CM | POA: Diagnosis not present

## 2022-12-10 LAB — RAD ONC ARIA SESSION SUMMARY
Course Elapsed Days: 22
Plan Fractions Treated to Date: 15
Plan Prescribed Dose Per Fraction: 2.67 Gy
Plan Total Fractions Prescribed: 15
Plan Total Prescribed Dose: 40.05 Gy
Reference Point Dosage Given to Date: 40.05 Gy
Reference Point Session Dosage Given: 2.67 Gy
Session Number: 15

## 2022-12-10 NOTE — Progress Notes (Signed)
Nutrition Follow Up:   Called patient as requested at mobile telephone# for self referral for diet education.   Patient is interested in learning more about Mediterranean Diet as her cholesterol levels are elevated and she is now under care of cardiologist.  She is no longer able to use Trulicity and has been struggling with weight control.  She also reports her spouse is very fond of red meat, an she has little experience with cooking seafood.  She has tried salmon recently and has enjoyed it a few time now at restaurants.  Fluid intake still only 32oz of water and coffee in morning (with creamers ) .  Patient has registered for the Cooking with [employer] Class next week, and has attended previous classes.  She has not attended Nutrition 101.  Medications: reviewed,    Labs:  11/19/22  BUN 24, Creat 1.10,   09/24/22  Chol 253   Height: 5'3" Weight: 11/26/22  225.6# 10/15/22  219# 09/18/22  219# 07/28/22  220#  UBW: 210 # in 2023. BMI: 39.96   Nutrition Diagnosis: Food and Nutrition related Knowledge Deficit related to Mediterranean Diet as evidenced by no prior need for nutrition related information. Improved answered questions.   Intervention:  Encouraged increase of zero carb fluids to goal 64-80oz per day. Reviewed guidelines of Mediterranean Diet and how it aligns with AICR guidelines with caution for concerns with calcium intake post treatment when she continues with antiestrogen therapy. Discussed preparation methods for fish. Emailed tips sheet for Breast Cancer Survivors, with links to Mediterranean Diet areas on the Academy of Nutrition and Dietetics, [employer], and [employer]. Will send AND resource next week.  Will put reminder in to send flyers for Nutrition Classes that are resuming in September.  Monitoring, Evaluation:   Goals: Patient will follow a healthy balanced Mediterranean diet for weight management and cholesterol control.     Gennaro Africa, RDN, LDN Registered Dietitian, [facility] Part Time Remote (Usual office hours: Tuesday-Thursday) Mobile: [phone]

## 2022-12-11 ENCOUNTER — Ambulatory Visit
Admission: RE | Admit: 2022-12-11 | Discharge: 2022-12-11 | Disposition: A | Discharge status: Home / Self Care | Payer: BC Managed Care – PPO | Source: Ambulatory Visit | Attending: Radiation Oncology | Admitting: Radiation Oncology

## 2022-12-11 ENCOUNTER — Other Ambulatory Visit: Payer: Self-pay

## 2022-12-11 DIAGNOSIS — C50412 Malignant neoplasm of upper-outer quadrant of left female breast: Secondary | ICD-10-CM | POA: Diagnosis not present

## 2022-12-11 LAB — RAD ONC ARIA SESSION SUMMARY
Course Elapsed Days: 23
Plan Fractions Treated to Date: 1
Plan Prescribed Dose Per Fraction: 2 Gy
Plan Total Fractions Prescribed: 5
Plan Total Prescribed Dose: 10 Gy
Reference Point Dosage Given to Date: 2 Gy
Reference Point Session Dosage Given: 2 Gy
Session Number: 16

## 2022-12-14 ENCOUNTER — Ambulatory Visit
Admission: RE | Admit: 2022-12-14 | Discharge: 2022-12-14 | Disposition: A | Discharge status: Home / Self Care | Payer: BC Managed Care – PPO | Source: Ambulatory Visit | Attending: Radiation Oncology | Admitting: Radiation Oncology

## 2022-12-14 ENCOUNTER — Other Ambulatory Visit: Payer: Self-pay | Admitting: Radiation Oncology

## 2022-12-14 ENCOUNTER — Other Ambulatory Visit: Payer: Self-pay

## 2022-12-14 DIAGNOSIS — C50412 Malignant neoplasm of upper-outer quadrant of left female breast: Secondary | ICD-10-CM

## 2022-12-14 LAB — RAD ONC ARIA SESSION SUMMARY
Course Elapsed Days: 26
Plan Fractions Treated to Date: 2
Plan Prescribed Dose Per Fraction: 2 Gy
Plan Total Fractions Prescribed: 5
Plan Total Prescribed Dose: 10 Gy
Reference Point Dosage Given to Date: 4 Gy
Reference Point Session Dosage Given: 2 Gy
Session Number: 17

## 2022-12-14 MED ORDER — FAMOTIDINE 40 MG PO TABS
40.0000 mg | ORAL_TABLET | Freq: Every day | ORAL | 0 refills | Status: DC
Start: 2022-12-14 — End: 2023-05-28

## 2022-12-15 ENCOUNTER — Ambulatory Visit: Payer: BC Managed Care – PPO

## 2022-12-15 ENCOUNTER — Ambulatory Visit
Admission: RE | Admit: 2022-12-15 | Discharge: 2022-12-15 | Disposition: A | Discharge status: Home / Self Care | Payer: BC Managed Care – PPO | Source: Ambulatory Visit | Attending: Radiation Oncology | Admitting: Radiation Oncology

## 2022-12-15 ENCOUNTER — Other Ambulatory Visit: Payer: Self-pay

## 2022-12-15 DIAGNOSIS — Z483 Aftercare following surgery for neoplasm: Secondary | ICD-10-CM

## 2022-12-15 DIAGNOSIS — Z170 Estrogen receptor positive status [ER+]: Secondary | ICD-10-CM

## 2022-12-15 DIAGNOSIS — R600 Localized edema: Secondary | ICD-10-CM

## 2022-12-15 DIAGNOSIS — M25612 Stiffness of left shoulder, not elsewhere classified: Secondary | ICD-10-CM

## 2022-12-15 DIAGNOSIS — C50412 Malignant neoplasm of upper-outer quadrant of left female breast: Secondary | ICD-10-CM | POA: Diagnosis not present

## 2022-12-15 DIAGNOSIS — R293 Abnormal posture: Secondary | ICD-10-CM

## 2022-12-15 LAB — RAD ONC ARIA SESSION SUMMARY
Course Elapsed Days: 27
Plan Fractions Treated to Date: 3
Plan Prescribed Dose Per Fraction: 2 Gy
Plan Total Fractions Prescribed: 5
Plan Total Prescribed Dose: 10 Gy
Reference Point Dosage Given to Date: 6 Gy
Reference Point Session Dosage Given: 2 Gy
Session Number: 18

## 2022-12-15 NOTE — Therapy (Signed)
OUTPATIENT PHYSICAL THERAPY  UPPER EXTREMITY ONCOLOGY EVALUATION  Patient Name: Holly Leach MRN: [id number] DOB:1971/02/05, 52 y.o., female Today's Date: 12/15/2022  END OF SESSION:  PT End of Session - 12/15/22 1404     Visit Number 2    Number of Visits 12    PT Start Time 1405    PT Stop Time 1453    PT Time Calculation (min) 48 min    Activity Tolerance Patient tolerated treatment well    Behavior During Therapy WFL for tasks assessed/performed             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   Past Surgical History:  Procedure Laterality Date   BREAST BIOPSY Left 06/19/2022   [place] LT BREAST BX W LOC DEV 1ST LESION IMG BX SPEC US GUIDE 06/19/2022 [facility]   BREAST BIOPSY  07/07/2022   MM LT RADIOACTIVE SEED LOC MAMMO GUIDE 07/07/2022 [facility]   BREAST LUMPECTOMY WITH RADIOACTIVE SEED AND SENTINEL LYMPH NODE BIOPSY Left 07/08/2022   Procedure: LEFT BREAST LUMPECTOMY WITH RADIOACTIVE SEED AND SENTINEL LYMPH NODE BIOPSY;  Surgeon: Almond Lint, MD;  Location: [facility];  Service: General;  Laterality: Left;   COLONOSCOPY     COLPOSCOPY  2006   FINE NEEDLE ASPIRATION Left 07/28/2022   Procedure: ASPIRATION OF LEFT BREAST SEROMA;  Surgeon: Almond Lint, MD;  Location: [facility];  Service: General;  Laterality: Left;   FRACTURE SURGERY Left 2020   foot - pt denies having surgery for this   HERNIA REPAIR     PORTACATH PLACEMENT Left 07/28/2022   Procedure: INSERTION PORT-A-CATH WITH ULTRASOUND GUIDANCE;  Surgeon: Almond Lint,  MD;  Location: [facility];  Service: General;  Laterality: Left;   Patient Active Problem List   Diagnosis Date Noted   Stage II breast cancer, left (HCC) 07/27/2022   Neck mass 11/03/2013   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/03/2013    PCP: Nadyne Coombes, MD  REFERRING PROVIDER: Dr. Lonie Peak  REFERRING DIAG: s/p Left Breast Cancer with cording  THERAPY DIAG:  Malignant neoplasm of upper-outer quadrant of left breast in female, estrogen receptor positive (HCC)  Localized edema  Aftercare following surgery for neoplasm  Stiffness of left shoulder, not elsewhere classified  Abnormal posture  ONSET DATE: November 02, 2022  Rationale for Evaluation and Treatment: Rehabilitation  SUBJECTIVE:  SUBJECTIVE STATEMENT:  I haven't done any exercises because my skin feels like its ripping. Thursday is my last day of radiation. The pain is worse than when I had surgery.Marland Kitchen  PERTINENT HISTORY:  Patient was diagnosed on 06/19/22 with left grade 2. It measures 1.4 x1.3 x 1.1 cm and is located in the upper outer quadrant. It is ER+, PR-, HER2-  with a Ki67 of 85%. Pt underwent a  L breast lumpectomy and SLNB on 07/08/22 with 0+/3 LN, followed by 4 cycles of chemo and is presently having radiation.. She has 7 more XRT left.  PAIN:  Are you having pain? Yes NPRS scale: 8/10 Pain location: left Axilla Pain orientation: Left  PAIN TYPE: tight Pain is constant Pain description: Aggravating factors: reaching, washing under arm Relieving factors: not reaching, nothing, medications  PRECAUTIONS: Left UE lymphedema risk, right ACL tear  WEIGHT BEARING RESTRICTIONS: No  FALLS:  Has patient fallen in last 6 months? No  LIVING ENVIRONMENT: Lives with: lives with their spouse Lives in: House/apartment Has following equipment at home:  None  OCCUPATION: Marine scientist at [employer]  LEISURE: Support Group  HAND DOMINANCE: right   PRIOR LEVEL OF FUNCTION: Independent  PATIENT GOALS: Decrease pain in axilla, and be able to reach better   OBJECTIVE:  COGNITION: Overall cognitive status: Within functional limits for tasks assessed   PALPATION: Fibrosis noted inferior to incision  OBSERVATIONS / OTHER ASSESSMENTS: no visible or palpable cording presently, significant redness from radiation  especially in left axillary/lateral trunk region, and with lighter redness throughout entire breast and beginnings of small blisters under the arm at axillary incision. Fibrosis noted around incision area of left breast  SENSATION: Light touch: Deficits    POSTURE: forward head, rounded shoulders  UPPER EXTREMITY AROM/PROM:  A/PROM RIGHT   eval   Shoulder extension 48  Shoulder flexion 159  Shoulder abduction 160  Shoulder internal rotation 55+  Shoulder external rotation 98    (Blank rows = not tested)  A/PROM LEFT   eval  Shoulder extension 40  Shoulder flexion 126/152 after stretching  Shoulder abduction 105/ 125 after stretching  Shoulder internal rotation 54  Shoulder external rotation 77    (Blank rows = not tested)  CERVICAL AROM: All within functional limits:     UPPER EXTREMITY STRENGTH:   LYMPHEDEMA ASSESSMENTS:   SURGERY TYPE/DATE: 07/08/22 Left Lumpectomy with SLNB  NUMBER OF LYMPH NODES REMOVED: 0/3  CHEMOTHERAPY: YES 08/07/22 4 treatments every 3 weeks  RADIATION:Yes, 7 treatments left  HORMONE TREATMENT: Pending  INFECTIONS: NO   LYMPHEDEMA ASSESSMENTS:   LANDMARK RIGHT  eval  At axilla    15 cm proximal to olecranon process   10 cm proximal to olecranon process 39.4  Olecranon process 30.9  15 cm proximal to ulnar styloid process   10 cm proximal to ulnar styloid process 27.5  Just proximal to ulnar styloid process 18.7  Across hand at thumb web space 19.3  At base of 2nd digit  6.5  (Blank rows = not tested)  LANDMARK LEFT  eval  At axilla    15 cm proximal to olecranon process   10 cm proximal to olecranon process 39  Olecranon process 30.4  15 cm proximal to ulnar styloid process   10 cm proximal to ulnar styloid process 26.4  Just proximal to ulnar styloid process 16.8  Across hand at thumb web space 19.3  At base of 2nd digit 6.5  (Blank rows = not tested)  GAIT: Distance walked: 40 Assistive device utilized: None Level of assistance: Complete Independence Comments: WNL  L-DEX LYMPHEDEMA SCREENING: The patient was assessed using the L-Dex machine today to produce a lymphedema index baseline score. The patient will be reassessed on a regular basis (typically every 3 months) to obtain new L-Dex scores. If the score is > 6.5 points away from his/her baseline score indicating onset of subclinical lymphedema, it will be recommended to wear a compression garment for 4 weeks, 12 hours per day and then be reassessed. If the score continues to be > 6.5 points from baseline at reassessment, we will initiate lymphedema treatment. Assessing in this manner has a 95% rate of preventing clinically significant lymphedema.  QUICK DASH SURVEY: 32%   TODAY'S TREATMENT:                                                                                                                                          DATE:   12/15/2022 Checked pts skin; Very red under arm but is not cracking or pulling excessively with ROM Pulleys x 2 min flex, scaption Ball rolls on wall x 10 Supine AROM 3 D bilaterally x5 flexion, scaption, horizontal abd in pain free ROM 1/4 inch foam pad in TG soft made for under pts arm to prevent rubbing, and pt fgiven several Telfa pads to use on skin Gentle PROM left shoulder flexion, scaption, abd, IR and ER with VC's to relax  12/09/22 PATIENT EDUCATION:  Education details: reviewed 4 post op exercises, initiated wand flexion and scaption,  reviewed star gazer and wall slides all x 5 Person educated: Patient Education method: Explanation and Handouts Education comprehension: verbalized understanding and returned demonstration  HOME EXERCISE PROGRAM: Supine wand flex and scaption, stargazer and scap. Retraction. Discussed compression bra when radiation is over and skin is less tender  ASSESSMENT:  CLINICAL IMPRESSION: Pt has 3 more radiation treatments left.  Her skin continues to be very red and she is tight in the axilla. She did well with the pulleys, but continues with ROM restrictions largely due to skin sensitivity. She will try some table slides at home, and gentle AAROM as given for HEP. OBJECTIVE IMPAIRMENTS: decreased activity tolerance, decreased knowledge of condition, decreased ROM, decreased strength, impaired UE functional use, and postural dysfunction, pain   ACTIVITY LIMITATIONS: sleeping, reach over head, and hygiene/grooming  PARTICIPATION LIMITATIONS: cleaning, laundry, and shopping  PERSONAL FACTORS: 3+ comorbidities: Left Breast Cancer s/p chemo and radiation  are also affecting patient's functional outcome.   REHAB POTENTIAL: Good  CLINICAL DECISION MAKING: Stable/uncomplicated  EVALUATION COMPLEXITY: Low  GOALS: Goals reviewed with patient? Yes  SHORT TERM GOALS=LONG TEM GOALS: Target date: 01/20/2023  Pt will be independent and compliant with HEP to improve shoulder ROM and strength Baseline: Goal status: INITIAL  2.  Pts quick dash will be improved to no greater than 10% Baseline:  Goal  status: INITIAL  3.  Pt will have left shoulder ROM improved to within 5-10 degrees of right for improved function Baseline:  Goal status: INITIAL  4.  Pt will have decreased pain and tightness by 50% for improved activity tolerance Baseline:  Goal status: INITIAL    PLAN:  PT FREQUENCY: 1-2x/week  PT DURATION: 6 weeks  PLANNED INTERVENTIONS: Therapeutic exercises, Therapeutic activity,  Patient/Family education, Self Care, Joint mobilization, scar mobilization, Manual therapy, and Re-evaluation  PLAN FOR NEXT SESSION:2x/week as possible but may have to cancel if skin too sensitive, Will follow up again 1-2 weeks after radiation for tissue to heal and for pt to tolerate further therapy as needed for ROM or if any breast swelling present. Due for [facility] screen early August., check ROM, check for breast edema, check goals and schedule further visits prn.  Waynette Buttery, PT 12/15/2022, 2:54 PM

## 2022-12-16 ENCOUNTER — Other Ambulatory Visit: Payer: Self-pay

## 2022-12-16 ENCOUNTER — Ambulatory Visit
Admission: RE | Admit: 2022-12-16 | Discharge: 2022-12-16 | Disposition: A | Discharge status: Home / Self Care | Payer: BC Managed Care – PPO | Source: Ambulatory Visit | Attending: Radiation Oncology | Admitting: Radiation Oncology

## 2022-12-16 ENCOUNTER — Ambulatory Visit: Payer: BC Managed Care – PPO

## 2022-12-16 DIAGNOSIS — C50412 Malignant neoplasm of upper-outer quadrant of left female breast: Secondary | ICD-10-CM | POA: Diagnosis not present

## 2022-12-16 LAB — RAD ONC ARIA SESSION SUMMARY
Course Elapsed Days: 28
Plan Fractions Treated to Date: 4
Plan Prescribed Dose Per Fraction: 2 Gy
Plan Total Fractions Prescribed: 5
Plan Total Prescribed Dose: 10 Gy
Reference Point Dosage Given to Date: 8 Gy
Reference Point Session Dosage Given: 2 Gy
Session Number: 19

## 2022-12-17 ENCOUNTER — Ambulatory Visit
Admission: RE | Admit: 2022-12-17 | Discharge: 2022-12-17 | Disposition: A | Discharge status: Home / Self Care | Payer: BC Managed Care – PPO | Source: Ambulatory Visit | Attending: Radiation Oncology | Admitting: Radiation Oncology

## 2022-12-17 ENCOUNTER — Other Ambulatory Visit: Payer: Self-pay

## 2022-12-17 DIAGNOSIS — C50412 Malignant neoplasm of upper-outer quadrant of left female breast: Secondary | ICD-10-CM | POA: Diagnosis not present

## 2022-12-17 DIAGNOSIS — C50912 Malignant neoplasm of unspecified site of left female breast: Secondary | ICD-10-CM

## 2022-12-17 LAB — RAD ONC ARIA SESSION SUMMARY
Course Elapsed Days: 29
Plan Fractions Treated to Date: 5
Plan Prescribed Dose Per Fraction: 2 Gy
Plan Total Fractions Prescribed: 5
Plan Total Prescribed Dose: 10 Gy
Reference Point Dosage Given to Date: 10 Gy
Reference Point Session Dosage Given: 2 Gy
Session Number: 20

## 2022-12-17 MED ORDER — SILVER SULFADIAZINE 1 % EX CREA: TOPICAL_CREAM | Freq: Two times a day (BID) | CUTANEOUS | Status: DC

## 2022-12-19 NOTE — Radiation Completion Notes (Signed)
Patient Name: Holly Leach, WEILL MRN: [id number] Date of Birth: 03-01-1971 Referring Physician: Nadyne Coombes, M.D. Date of Service: 2022-12-19 Radiation Oncologist: Lonie Peak, M.D. [facility]                             RADIATION ONCOLOGY END OF TREATMENT NOTE     Diagnosis: C50.412 Malignant neoplasm of upper-outer quadrant of left female breast Staging on 2022-06-30: Malignant neoplasm of upper-outer quadrant of left breast in female, estrogen receptor positive (HCC) T=cT1c, N=cN0, M=cM0 Staging on 2022-07-27: Stage II breast cancer, left (HCC) T=cT2, N=cN0, M=cM0 Intent: Curative     ==========DELIVERED PLANS==========  First Treatment Date: 2022-11-18 - Last Treatment Date: 2022-12-17   Plan Name: Breast_L_BH Site: Breast, Left Technique: 3D Mode: Photon Dose Per Fraction: 2.67 Gy Prescribed Dose (Delivered / Prescribed): 40.05 Gy / 40.05 Gy Prescribed Fxs (Delivered / Prescribed): 15 / 15   Plan Name: Brst_L_BH_Bst Site: Breast, Left Technique: 3D Mode: Photon Dose Per Fraction: 2 Gy Prescribed Dose (Delivered / Prescribed): 10 Gy / 10 Gy Prescribed Fxs (Delivered / Prescribed): 5 / 5     ==========ON TREATMENT VISIT DATES========== 2022-11-23, 2022-11-30, 2022-12-04, 2022-12-08, 2022-12-14     ==========UPCOMING VISITS========== 2022-12-31 No Location Listed Wait List No Provider Listed  2022-12-24 No Location Listed Wait List No Provider Listed        ==========APPENDIX - ON TREATMENT VISIT NOTES==========   See weekly On Treatment Notes in Epic for details.

## 2022-12-21 ENCOUNTER — Encounter: Payer: Self-pay | Admitting: Hematology & Oncology

## 2022-12-21 ENCOUNTER — Inpatient Hospital Stay: Payer: BC Managed Care – PPO | Attending: Hematology & Oncology | Admitting: Psychiatry

## 2022-12-21 ENCOUNTER — Encounter: Payer: Self-pay | Admitting: Psychiatry

## 2022-12-21 ENCOUNTER — Other Ambulatory Visit: Payer: Self-pay

## 2022-12-21 ENCOUNTER — Encounter: Payer: Self-pay | Admitting: *Deleted

## 2022-12-21 VITALS — BP 120/90 | HR 86 | Temp 98.5°F | Resp 16 | Ht 63.0 in | Wt 222.0 lb

## 2022-12-21 DIAGNOSIS — Z79899 Other long term (current) drug therapy: Secondary | ICD-10-CM | POA: Diagnosis not present

## 2022-12-21 DIAGNOSIS — Z923 Personal history of irradiation: Secondary | ICD-10-CM | POA: Diagnosis not present

## 2022-12-21 DIAGNOSIS — I10 Essential (primary) hypertension: Secondary | ICD-10-CM | POA: Insufficient documentation

## 2022-12-21 DIAGNOSIS — Z79890 Hormone replacement therapy (postmenopausal): Secondary | ICD-10-CM | POA: Diagnosis not present

## 2022-12-21 DIAGNOSIS — E119 Type 2 diabetes mellitus without complications: Secondary | ICD-10-CM | POA: Insufficient documentation

## 2022-12-21 DIAGNOSIS — Z79818 Long term (current) use of other agents affecting estrogen receptors and estrogen levels: Secondary | ICD-10-CM | POA: Insufficient documentation

## 2022-12-21 DIAGNOSIS — Z800 Family history of malignant neoplasm of digestive organs: Secondary | ICD-10-CM | POA: Diagnosis not present

## 2022-12-21 DIAGNOSIS — Z170 Estrogen receptor positive status [ER+]: Secondary | ICD-10-CM | POA: Diagnosis not present

## 2022-12-21 DIAGNOSIS — C50412 Malignant neoplasm of upper-outer quadrant of left female breast: Secondary | ICD-10-CM | POA: Insufficient documentation

## 2022-12-21 DIAGNOSIS — E039 Hypothyroidism, unspecified: Secondary | ICD-10-CM | POA: Insufficient documentation

## 2022-12-21 DIAGNOSIS — Z86711 Personal history of pulmonary embolism: Secondary | ICD-10-CM | POA: Diagnosis not present

## 2022-12-21 DIAGNOSIS — K219 Gastro-esophageal reflux disease without esophagitis: Secondary | ICD-10-CM | POA: Diagnosis not present

## 2022-12-21 DIAGNOSIS — Z9221 Personal history of antineoplastic chemotherapy: Secondary | ICD-10-CM | POA: Diagnosis not present

## 2022-12-21 DIAGNOSIS — E785 Hyperlipidemia, unspecified: Secondary | ICD-10-CM | POA: Diagnosis not present

## 2022-12-21 DIAGNOSIS — C50912 Malignant neoplasm of unspecified site of left female breast: Secondary | ICD-10-CM

## 2022-12-21 NOTE — Patient Instructions (Signed)
It was a pleasure to see you in clinic today. - we discussed that based on your [facility] level it appears you are postmenopausal. Given this, an oophorectomy may not add benefit at this time to your breast cancer treatment. - However, if your genetic testing returns with a certain gene that increases your risk of ovarian cancer, than a oophorectomy may be indicated to reduce ovarian cancer risk. - Please let our office know when you have your genetic testing results and I will review.  Thank you very much for allowing me to provide care for you today.  I appreciate your confidence in choosing our Gynecologic Oncology team at [facility].  If you have any questions about your visit today please call our office or send [place] a MyChart message and we will get back to you as soon as possible.

## 2022-12-21 NOTE — Progress Notes (Signed)
GYNECOLOGIC ONCOLOGY NEW PATIENT CONSULTATION  Date of Service: 12/21/2022 Referring Provider: Almond Lint, MD   ASSESSMENT AND PLAN: Holly Leach is a 52 y.o. woman with ER+/PR-/HER2- breast cancer.  Reviewed that based on the available information, it appears that patient was postmenopausal at the time of her diagnosis of breast cancer as suggested by her elevated FSH.  In the setting of postmenopausal status, oophorectomy does not appear to add benefit to her breast cancer treatment.  So at this time, do not feel that oophorectomy is indicated and she should continue with hormonal treatment as prescribed for her medical oncologist.  However, did discuss that there are some genetic conditions that increase risk for both breast and ovarian cancer.  Patient has appointment with genetic counselor on Thursday.  If genetic testing suggest a germline condition that could increase her risk of ovarian cancer, then we would discuss risk reducing surgery in this setting.  Otherwise, patient concerned today about possible infection from her radiation treatment.  Abnormal drainage noted from site.  We contacted her radiation oncologist office, and some members of her team and radiation oncology was able to visit with her in our clinic today.  Patient instructed to notify our clinic when her genetic testing results have come through and I will review to see if any change in my recommendations.   A copy of this note was sent to the patient's referring provider.  Clide Cliff, MD Gynecologic Oncology   Medical Decision Making I personally spent  TOTAL 45 minutes face-to-face and non-face-to-face in the care of this patient, which includes all pre, intra, and post visit time on the date of service.   ------------  CC: Hormone receptor positive breast cancer  HISTORY OF PRESENT ILLNESS:  Holly Leach is a 52 y.o. woman who is seen in consultation at the request of Almond Lint, MD for  evaluation of hormone receptor positive breast cancer.  Patient was diagnosed with left breast cancer in January 2024.  She underwent a left breast seed localized lumpectomy and sentinel lymph node biopsy on 07/08/2022 with negative nodes and margins, final stage pT2N0, ER+/PR-/HER2-.  Postoperatively she was treated with chemotherapy and radiation therapy.  She has now started on Femara for hormonal therapy. FSH has been elevated (30.5, 26.8).  Today, patient reports feeling under the weather due to radiation changes.  She notes pain and drainage from her left breast that she attributes to radiation treatment.  She otherwise reports that she was on continuous OCPs for some time for treatment of heavy vaginal bleeding, headaches.  She was discontinued these around December 2023 to see if she was postmenopausal.  She had no resumption of bleeding since that time.  Her FSH was checked in January after being off of OCPs for at least 4 weeks and her [facility] was noted to be elevated.  Otherwise, patient reports a history of umbilical hernia repair with mesh in February 2022 with Dr. Andrey Campanile at [facility].  Patient has an appoint with genetics on 12/24/2022.    TREATMENT HISTORY: Oncology History  Malignant neoplasm of upper-outer quadrant of left breast in female, estrogen receptor positive (HCC) (Resolved)  06/24/2022 Initial Diagnosis   Malignant neoplasm of upper-outer quadrant of left breast in female, estrogen receptor positive (HCC)   06/30/2022 Cancer Staging   Staging form: Breast, AJCC 8th Edition - Clinical: Stage IA (cT1c, cN0, cM0, G2, ER+, PR-, HER2-) - Signed by Erven Colla, PA-C on 06/30/2022 Stage prefix: Initial diagnosis Method  of lymph node assessment: Clinical Histologic grading system: 3 grade system   Stage II breast cancer, left (HCC)  07/27/2022 Initial Diagnosis   Stage II breast cancer, left (HCC)   07/27/2022 Cancer Staging   Staging form: Breast, AJCC 8th Edition -  Clinical stage from 07/27/2022: Stage IIB (cT2, cN0, cM0, G3, ER+, PR-, HER2-) - Signed by Josph Macho, MD on 07/27/2022 Nuclear grade: G3 Histologic grading system: 3 grade system   08/13/2022 -  Chemotherapy   Patient is on Treatment Plan : BREAST TC q21d       PAST MEDICAL HISTORY: 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    PAST SURGICAL HISTORY: Past Surgical History:  Procedure Laterality Date   BREAST BIOPSY Left 06/19/2022   [place] LT BREAST BX W LOC DEV 1ST LESION IMG BX SPEC US GUIDE 06/19/2022 [facility]   BREAST BIOPSY  07/07/2022   MM LT RADIOACTIVE SEED LOC MAMMO GUIDE 07/07/2022 [facility]   BREAST LUMPECTOMY WITH RADIOACTIVE SEED AND SENTINEL LYMPH NODE BIOPSY Left 07/08/2022   Procedure: LEFT BREAST LUMPECTOMY WITH RADIOACTIVE SEED AND SENTINEL LYMPH NODE BIOPSY;  Surgeon: Almond Lint, MD;  Location: [facility] OR;  Service: General;  Laterality: Left;   COLONOSCOPY     COLPOSCOPY  2006   FINE NEEDLE ASPIRATION Left 07/28/2022   Procedure: ASPIRATION OF LEFT BREAST SEROMA;  Surgeon: Almond Lint, MD;  Location: [facility] OR;  Service: General;  Laterality: Left;   FRACTURE SURGERY Left 2020   foot - pt denies having surgery for this   HERNIA REPAIR     umbilical   PORTACATH PLACEMENT Left 07/28/2022   Procedure: INSERTION PORT-A-CATH WITH ULTRASOUND GUIDANCE;  Surgeon: Almond Lint, MD;  Location: [facility] OR;  Service: General;  Laterality: Left;    OB/GYN HISTORY: OB History  Gravida Para Term Preterm AB Living  2 2 1 1   1    SAB IAB Ectopic Multiple Live Births        1 3    # Outcome Date GA Lbr Len/2nd Weight Sex Type Anes PTL Lv  2 Term      Vag-Spont   LIV  1A Preterm      Vag-Spont   ND  1B Preterm      Vag-Spont   ND      Age at menarche: 60 Age at menopause: was on continuous birth control pill, so unknown Hx of HRT: continuous OCPs for heavy bleeding, headaches Hx of STI: no Last pap: 2021, nml per pt History of abnormal pap smears: CIN3, 2009 per pt (colposcopy), possible freezing at some point  SCREENING STUDIES:  Last mammogram: 2024 Last colonoscopy: 2022  MEDICATIONS:  Current Outpatient Medications:    ALPRAZolam (XANAX) 0.25 MG tablet, Take 1 tablet (0.25 mg total) by mouth at bedtime as needed for anxiety., Disp: 30 tablet, Rfl: 0   Bacillus Coagulans-Inulin (PROBIOTIC-PREBIOTIC PO), Take 2.9 g by mouth daily.,  Disp: , Rfl:    carvedilol (COREG) 3.125 MG tablet, Take 3.125 mg by mouth 2 (two) times daily., Disp: , Rfl:    COLLAGEN PO, Take 1 tablet by mouth daily. New skin collagen, Disp: , Rfl:    famotidine (PEPCID) 40 MG tablet, Take 1 tablet (40 mg total) by mouth daily., Disp: 30 tablet, Rfl: 0   fluticasone (FLONASE) 50 MCG/ACT nasal spray, Place 2 sprays into both nostrils daily as needed for allergies., Disp: , Rfl:    Levothyroxine Sodium 25 MCG CAPS, Take 25 mcg by mouth daily before breakfast., Disp: , Rfl:    lidocaine-prilocaine (EMLA) cream, Apply a dime size to port-a-cath 1-2 hours prior to access. Cover with [employer]., Disp: 30 g, Rfl: 2   lisinopril-hydrochlorothiazide (ZESTORETIC) 20-25 MG tablet, Take 1 tablet by mouth 2 (two) times daily., Disp: , Rfl:    loratadine (CLARITIN) 10 MG tablet, Take 10 mg by mouth daily., Disp: , Rfl:    megestrol (MEGACE) 20 MG tablet, Take 1 tablet (20 mg total) by mouth daily. Take 1 tablet (20 mg total) by mouth daily for hot flashes., Disp: 30 tablet, Rfl: 5   Melatonin 10 MG TABS, Take 10 mg by mouth at bedtime., Disp: , Rfl:     montelukast (SINGULAIR) 10 MG tablet, Take 10 mg by mouth daily., Disp: , Rfl:    ondansetron (ZOFRAN) 8 MG tablet, Take 1 tablet (8 mg total) by mouth every 8 (eight) hours as needed for nausea or vomiting. Start on the third day after chemotherapy., Disp: 30 tablet, Rfl: 1   prochlorperazine (COMPAZINE) 10 MG tablet, Take 1 tablet (10 mg total) by mouth every 6 (six) hours as needed for nausea or vomiting., Disp: 30 tablet, Rfl: 1   UNABLE TO FIND, Cranial Prosthesis, Disp: 1 Units, Rfl: 0   Vitamin D, Ergocalciferol, (DRISDOL) 1.25 MG (50000 UNIT) CAPS capsule, Take 50,000 Units by mouth once a week., Disp: , Rfl:   ALLERGIES: Allergies  Allergen Reactions   Augmentin [Amoxicillin-Pot Clavulanate] Diarrhea    Yeast Infection    FAMILY HISTORY: Family History  Problem Relation Age of Onset   Hypertension Mother    Stroke Mother    Hypertension Father    Heart disease Father    Irritable bowel syndrome Sister    Hypertension Brother    Hyperlipidemia Brother    Stomach cancer Maternal Grandfather    Diabetes Paternal Grandmother    Irritable bowel syndrome Maternal Aunt    Breast cancer Other        Maternal Great Aunt   Colon cancer Neg Hx    Colon polyps Neg Hx    Esophageal cancer Neg Hx    Rectal cancer Neg Hx    Ovarian cancer Neg Hx    Endometrial cancer Neg Hx     SOCIAL HISTORY: Social History   Socioeconomic History   Marital status: Married    Spouse name: Not on file   Number of children: 1   Years of education: Not on file   Highest education level: Not on file  Occupational History   Occupation: Environmental health practitioner  Tobacco Use   Smoking status: Never   Smokeless tobacco: Never  Vaping Use   Vaping status: Never Used  Substance and Sexual Activity   Alcohol use: No    Alcohol/week: 0.0 standard drinks of alcohol   Drug use: No   Sexual activity: Yes  Other Topics Concern   Not on file  Social History  Narrative   Not on file   Social  Determinants of Health   Financial Resource Strain: Medium Risk (07/23/2022)   Overall Financial Resource Strain (CARDIA)    Difficulty of Paying Living Expenses: Somewhat hard  Food Insecurity: No Food Insecurity (06/30/2022)   Hunger Vital Sign    Worried About Running Out of Food in the Last Year: Never true    Ran Out of Food in the Last Year: Never true  Transportation Needs: No Transportation Needs (06/30/2022)   PRAPARE - Administrator, Civil Service (Medical): No    Lack of Transportation (Non-Medical): No  Physical Activity: Not on file  Stress: Not on file  Social Connections: Socially Integrated (07/23/2022)   Social Connection and Isolation Panel [NHANES]    Frequency of Communication with Friends and Family: More than three times a week    Frequency of Social Gatherings with Friends and Family: More than three times a week    Attends Religious Services: More than 4 times per year    Active Member of [employer] or Organizations: Yes    Attends Engineer, structural: More than 4 times per year    Marital Status: Married  Catering manager Violence: Not At Risk (06/30/2022)   Humiliation, Afraid, Rape, and Kick questionnaire    Fear of Current or Ex-Partner: No    Emotionally Abused: No    Physically Abused: No    Sexually Abused: No    REVIEW OF SYSTEMS: New patient intake form was reviewed.  Complete 10-system review is negative except for the following: hotflashes  PHYSICAL EXAM: BP (!) 120/90 Comment: taken manual MD and CMA notified  Pulse 86   Temp 98.5 F (36.9 C) (Oral)   Resp 16   Ht 5\' 3"  (1.6 m)   Wt 222 lb (100.7 kg)   SpO2 100% Comment: RA  BMI 39.33 kg/m  Constitutional: No acute distress. Neuro/Psych: Alert, oriented.  Head and Neck: Normocephalic, atraumatic. Neck symmetric without masses. Sclera anicteric.  Respiratory: Normal work of breathing.  Breast: Left breast with radiation skin changes, erythema and induration, telfa pad  removed and purulent appearing drainage on skin and pad Extremities: Grossly normal range of motion. Warm, well perfused. No edema bilaterally. GU: deferred  LABORATORY AND RADIOLOGIC DATA: Outside medical records were reviewed to synthesize the above history, along with the history and physical obtained during the visit.  Outside laboratory, pathology, and imaging reports were reviewed, with pertinent results below.    WBC  Date Value Ref Range Status  08/16/2022 14.1 (H) 4.0 - 10.5 K/uL Final   WBC Count  Date Value Ref Range Status  11/19/2022 6.1 4.0 - 10.5 K/uL Final   Hemoglobin  Date Value Ref Range Status  11/19/2022 9.7 (L) 12.0 - 15.0 g/dL Final   HGB  Date Value Ref Range Status  06/25/2015 12.6 11.6 - 15.9 g/dL Final   HCT  Date Value Ref Range Status  11/19/2022 29.6 (L) 36.0 - 46.0 % Final  06/25/2015 37.1 34.8 - 46.6 % Final   Platelets  Date Value Ref Range Status  06/25/2015 268 145 - 400 10e3/uL Final   Platelet Count  Date Value Ref Range Status  11/19/2022 280 150 - 400 K/uL Final   LDH  Date Value Ref Range Status  10/15/2022 210 (H) 98 - 192 U/L Final    Comment:    Performed at [facility], [address] [place],  [place] [address]   Magnesium  Date Value Ref Range Status  10/15/2022 1.9 1.7 - 2.4 mg/dL Final    Comment:    Performed at [facility], [address] [place], [place] [address]   Creatinine  Date Value Ref Range Status  11/19/2022 1.10 (H) 0.44 - 1.00 mg/dL Final   AST  Date Value Ref Range Status  11/19/2022 15 15 - 41 U/L Final   ALT  Date Value Ref Range Status  11/19/2022 16 0 - 44 U/L Final   FSH: 30.5 (06/2022), 26.8 (11/19/22)    FINAL MICROSCOPIC DIAGNOSIS:  A. BREAST, LEFT W/SEED, LUMPECTOMY: - Invasive ductal carcinoma, 2.2 cm, grade 3 - Ductal carcinoma in situ, high-grade with focal necrosis - Resection margins are negative for carcinoma; closest  is the posterior margin at 0.3 cm - Biopsy site changes - See oncology table  B. SENTINEL LYMPH NODE, LEFT AXILLARY #1, BIOPSY: - Lymph node, negative for carcinoma (0/1)  C. SENTINEL LYMPH NODE, LEFT AXILLARY #2, BIOPSY: - Lymph node, negative for carcinoma (0/1)  D. SENTINEL LYMPH NODE, LEFT AXILLARY #3, BIOPSY: - Lymph node, negative for carcinoma (0/1)  E. BREAST, LEFT ADDITIONAL INFERIOR MARGIN, EXCISION: - Benign breast parenchyma, negative for carcinoma  F. BREAST, LEFT ADDITIONAL MEDIAL MARGIN, EXCISION: - Mild fibrocystic change, negative for carcinoma      ONCOLOGY TABLE:  Procedure: Lumpectomy Specimen Laterality: Left Histologic Type: Invasive ductal carcinoma Histologic Grade:      Glandular (Acinar)/Tubular Differentiation: 3      Nuclear Pleomorphism: 3      Mitotic Rate: 3      Overall Grade: 3 Tumor Size: 2.2 cm Ductal Carcinoma In Situ: Present, high-grade Treatment Effect in the Breast: No known presurgical therapy Margins: All margins negative for invasive carcinoma      Distance from Closest Margin (mm): 3 mm      Specify Closest Margin (required only if <32mm): Posterior margin DCIS Margins: Uninvolved by DCIS      Distance from Closest Margin (mm): 4 mm      Specify Closest Margin (required only if <71mm): Posterior margin Regional Lymph Nodes:      Number of Lymph Nodes Examined: 3      Number of Sentinel Nodes Examined: 3      Number of Lymph Nodes with Macrometastases (>2 mm): 0      Number of Lymph Nodes with Micrometastases: 0      Number of Lymph Nodes with Isolated Tumor Cells (=0.2 mm or =200 cells): 0      Size of Largest Metastatic Deposit (mm): Not applicable      Extranodal Extension: Not applicable Distant Metastasis:      Distant Site(s) Involved: Not applicable Breast Biomarker Testing Performed on Previous Biopsy:      Testing Performed on Case Number: [id number]            Estrogen Receptor: 40%, positive, weak staining  intensity            Progesterone Receptor: 0%, negative            HER2: Negative (0)            Ki-67: 85% Pathologic Stage Classification (pTNM, AJCC 8th Edition): pT2, pN0 Representative Tumor Block: A5 Comment(s): None

## 2022-12-22 ENCOUNTER — Encounter: Payer: Self-pay | Admitting: Hematology & Oncology

## 2022-12-23 ENCOUNTER — Other Ambulatory Visit: Payer: Self-pay | Admitting: Radiation Oncology

## 2022-12-23 ENCOUNTER — Other Ambulatory Visit: Payer: Self-pay | Admitting: *Deleted

## 2022-12-23 MED ORDER — MEGESTROL ACETATE 20 MG PO TABS: 40.0000 mg | ORAL_TABLET | Freq: Every day | ORAL | 5 refills | Status: DC

## 2022-12-23 MED ORDER — SILVER SULFADIAZINE 1 % EX CREA: 1.0000 | TOPICAL_CREAM | Freq: Two times a day (BID) | CUTANEOUS | 1 refills | Status: DC

## 2022-12-24 ENCOUNTER — Inpatient Hospital Stay: Payer: BC Managed Care – PPO

## 2022-12-24 ENCOUNTER — Other Ambulatory Visit: Payer: Self-pay | Admitting: Genetic Counselor

## 2022-12-24 ENCOUNTER — Inpatient Hospital Stay (HOSPITAL_BASED_OUTPATIENT_CLINIC_OR_DEPARTMENT_OTHER): Payer: BC Managed Care – PPO | Admitting: Genetic Counselor

## 2022-12-24 ENCOUNTER — Ambulatory Visit (HOSPITAL_COMMUNITY): Admission: RE | Admit: 2022-12-24 | Payer: BC Managed Care – PPO | Source: Ambulatory Visit

## 2022-12-24 DIAGNOSIS — Z803 Family history of malignant neoplasm of breast: Secondary | ICD-10-CM

## 2022-12-24 DIAGNOSIS — Z1379 Encounter for other screening for genetic and chromosomal anomalies: Secondary | ICD-10-CM

## 2022-12-24 DIAGNOSIS — Z8042 Family history of malignant neoplasm of prostate: Secondary | ICD-10-CM

## 2022-12-24 DIAGNOSIS — C50912 Malignant neoplasm of unspecified site of left female breast: Secondary | ICD-10-CM

## 2022-12-24 LAB — GENETIC SCREENING ORDER

## 2022-12-28 ENCOUNTER — Encounter: Payer: Self-pay | Admitting: Hematology & Oncology

## 2022-12-28 NOTE — Progress Notes (Signed)
REFERRING PROVIDER: Arlan Organ, MD  PRIMARY PROVIDER:  Dois Davenport, MD  PRIMARY REASON FOR VISIT:  Encounter Diagnoses  Name Primary?   Stage II breast cancer, left (HCC) Yes   Family history of breast cancer    Family history of prostate cancer    HISTORY OF PRESENT ILLNESS:   Holly Leach, a 52 y.o. female, was seen for a [facility] cancer genetics consultation at the request of Dr. Myna Hidalgo due to a personal and family history of cancer.  Holly Leach presents to clinic today to discuss the possibility of a hereditary predisposition to cancer, to discuss genetic testing, and to further clarify her future cancer risks, as well as potential cancer risks for family members.   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  CANCER HISTORY:  Oncology History  Malignant neoplasm of upper-outer quadrant of left breast in female, estrogen receptor positive (HCC) (Resolved)  06/24/2022 Initial Diagnosis   Malignant neoplasm of upper-outer quadrant of left breast in female, estrogen receptor positive (HCC)   06/30/2022 Cancer Staging   Staging form: Breast, AJCC 8th Edition - Clinical: Stage IA (cT1c, cN0, cM0, G2, ER+, PR-, HER2-) - Signed by Erven Colla, PA-C on 06/30/2022 Stage prefix: Initial diagnosis Method of lymph node assessment: Clinical Histologic grading system: 3 grade system   Stage II breast cancer, left (HCC)  07/27/2022 Initial Diagnosis   Stage II breast cancer, left (HCC)   07/27/2022 Cancer Staging   Staging form: Breast, AJCC 8th Edition - Clinical stage from 07/27/2022: Stage IIB (cT2, cN0, cM0, G3, ER+, PR-, HER2-) - Signed by Josph Macho, MD on 07/27/2022 Nuclear grade: G3 Histologic grading system: 3 grade system   08/13/2022 -  Chemotherapy   Patient is on Treatment Plan : BREAST TC q21d       Past Medical History:  Diagnosis Date   ACL tear     right knee   Cancer (HCC)    Chronic headaches    Diabetes mellitus without complication (HCC)    GERD (gastroesophageal reflux disease)    d/t trulicity   Hyperlipidemia    Hypertension    Hypothyroidism    OSA on CPAP    PE (pulmonary embolism) 2006   Pre-diabetes    Stage II breast cancer, left (HCC) 07/27/2022   Thyroid disease    Tuberculosis    as a child 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    Past Surgical History:  Procedure Laterality Date   BREAST BIOPSY Left 06/19/2022   [place] LT BREAST BX W LOC DEV 1ST LESION IMG BX SPEC US GUIDE 06/19/2022 [facility]   BREAST BIOPSY  07/07/2022   MM LT RADIOACTIVE SEED LOC MAMMO GUIDE 07/07/2022 [facility]   BREAST LUMPECTOMY WITH RADIOACTIVE SEED AND SENTINEL LYMPH NODE BIOPSY Left 07/08/2022   Procedure: LEFT BREAST LUMPECTOMY WITH RADIOACTIVE SEED AND SENTINEL LYMPH NODE BIOPSY;  Surgeon: Almond Lint, MD;  Location: [facility] OR;  Service: General;  Laterality: Left;   COLONOSCOPY     COLPOSCOPY  2006   FINE NEEDLE ASPIRATION Left 07/28/2022   Procedure: ASPIRATION OF LEFT BREAST SEROMA;  Surgeon: Almond Lint, MD;  Location: [facility] OR;  Service: General;  Laterality: Left;   FRACTURE SURGERY Left 2020   foot - pt denies having surgery for this   HERNIA REPAIR     umbilical  PORTACATH PLACEMENT Left 07/28/2022   Procedure: INSERTION PORT-A-CATH WITH ULTRASOUND GUIDANCE;  Surgeon: Almond Lint, MD;  Location: [facility] OR;  Service: General;  Laterality: Left;    Social History   Socioeconomic History   Marital status: Married    Spouse name: Not on file   Number of children: 1   Years of education: Not on file   Highest education level: Not on file  Occupational History   Occupation: Environmental health practitioner  Tobacco Use   Smoking status: Never   Smokeless tobacco: Never  Vaping Use   Vaping status: Never Used  Substance and Sexual Activity   Alcohol use: No    Alcohol/week: 0.0 standard drinks  of alcohol   Drug use: No   Sexual activity: Yes  Other Topics Concern   Not on file  Social History Narrative   Not on file   Social Determinants of Health   Financial Resource Strain: Medium Risk (07/23/2022)   Overall Financial Resource Strain (CARDIA)    Difficulty of Paying Living Expenses: Somewhat hard  Food Insecurity: No Food Insecurity (06/30/2022)   Hunger Vital Sign    Worried About Running Out of Food in the Last Year: Never true    Ran Out of Food in the Last Year: Never true  Transportation Needs: No Transportation Needs (06/30/2022)   PRAPARE - Administrator, Civil Service (Medical): No    Lack of Transportation (Non-Medical): No  Physical Activity: Not on file  Stress: Not on file  Social Connections: Socially Integrated (07/23/2022)   Social Connection and Isolation Panel [NHANES]    Frequency of Communication with Friends and Family: More than three times a week    Frequency of Social Gatherings with Friends and Family: More than three times a week    Attends Religious Services: More than 4 times per year    Active Member of [employer] or Organizations: Yes    Attends Engineer, structural: More than 4 times per year    Marital Status: Married     FAMILY HISTORY:  We obtained a detailed, 4-generation family history.  Significant diagnoses are listed below: Family History  Problem Relation Age of Onset   Hypertension Mother    Stroke Mother    Hypertension Father    Heart disease Father    Irritable bowel syndrome Sister    Hypertension Brother    Hyperlipidemia Brother    Stomach cancer Maternal Grandfather    Diabetes Paternal Grandmother    Irritable bowel syndrome Maternal Aunt    Breast cancer Other        Maternal Great Aunt   Colon cancer Neg Hx    Colon polyps Neg Hx    Esophageal cancer Neg Hx    Rectal cancer Neg Hx    Ovarian cancer Neg Hx    Endometrial cancer Neg Hx      Holly Leach's maternal uncle was diagnosed with  prostate cancer at an unknown age, he died at age 26. A second maternal uncle was diagnosed with liver cancer at age 43, he died at age 78. Her maternal great aunt was diagnosed with breast cancer at an unknown age. This great aunt's daughter (her maternal first cousin once removed) was diagnosed with breast cancer at an unknown age, she reportedly had negative genetic testing. Her maternal grandfather was diagnosed with kidney cancer at an unknown age, he is deceased. There is no reported Ashkenazi Jewish ancestry.   GENETIC COUNSELING ASSESSMENT: Holly Leach is  a 52 y.o. female with a personal and family history of cancer which is somewhat suggestive of a hereditary predisposition to cancer. We, therefore, discussed and recommended the following at today's visit.   DISCUSSION: We discussed that 5 - 10% of cancer is hereditary, with most cases of breast cancer associated with BRCA1/2.  There are other genes that can be associated with hereditary breast cancer syndromes.  We discussed that testing is beneficial for several reasons including knowing how to follow individuals after completing their treatment, identifying whether potential treatment options would be beneficial, and understanding if other family members could be at risk for cancer and allowing them to undergo genetic testing.   We reviewed the characteristics, features and inheritance patterns of hereditary cancer syndromes. We also discussed genetic testing, including the appropriate family members to test, the process of testing, insurance coverage and turn-around-time for results. We discussed the implications of a negative, positive, carrier and/or variant of uncertain significant result. We recommended Holly Leach pursue genetic testing for a panel that includes genes associated with breast, prostate, and kidney cancer.   Holly Leach was offered a common hereditary cancer panel (48 genes+ FLCN and MET) and an expanded pan-cancer panel (70  genes). Holly Leach was informed of the benefits and limitations of each panel, including that expanded pan-cancer panels contain genes that do not have clear management guidelines at this point in time.  We also discussed that as the number of genes included on a panel increases, the chances of variants of uncertain significance increases. After considering the benefits and limitations of each gene panel, Holly Leach elected to have [facility] Custom Panel.  The Custom Hereditary Cancers Panel offered by [facility] includes sequencing and/or deletion duplication testing of the following 50 genes: APC, ATM, AXIN2, BAP1, BARD1, BMPR1A, BRCA1, BRCA2, BRIP1, CDH1, CDK4, CDKN2A (p14ARF and p16INK4a only), CHEK2, CTNNA1, DICER1, EPCAM (Deletion/duplication testing only), FH, FLCN, GREM1 (promoter region duplication testing only), HOXB13, KIT, MBD4, MEN1, MET, MLH1, MSH2, MSH3, MSH6, MUTYH, NF1, NHTL1, PALB2, PDGFRA, PMS2, POLD1, POLE, PTEN, RAD51C, RAD51D, SDHA (sequencing analysis only except exon 14), SDHB, SDHC, SDHD, SMAD4, SMARCA4. STK11, TP53, TSC1, TSC2, and VHL.  Based on Holly Leach's personal and family history of cancer, she meets medical criteria for genetic testing. Despite that she meets criteria, she may still have an out of pocket cost. We discussed that if her out of pocket cost for testing is over $100, the laboratory will call and confirm whether she wants to proceed with testing.  If the out of pocket cost of testing is less than $100 she will be billed by the genetic testing laboratory.   PLAN: After considering the risks, benefits, and limitations, Holly Leach provided informed consent to pursue genetic testing and the blood sample was sent to [facility] for analysis of the Custom Panel. Results should be available within approximately 2-3 weeks' time, at which point they will be disclosed by telephone to Holly Leach, as will any additional recommendations warranted by these results. Holly Leach  will receive a summary of her genetic counseling visit and a copy of her results once available. This information will also be available in Epic.    Holly Leach's questions were answered to her satisfaction today. Our contact information was provided should additional questions or concerns arise. Thank you for the referral and allowing [place] to share in the care of your patient.   Lalla Brothers, MS, [facility] Genetic Counselor [email or web address] (P) [phone]  The patient was seen for  a total of 35 minutes in face-to-face genetic counseling. The patient was seen alone.  Drs. Pamelia Hoit and/or Mosetta Putt were available to discuss this case as needed.   _______________________________________________________________________ For Office Staff:  Number of people involved in session: 1 Was an Intern/ student involved with case: no

## 2022-12-29 ENCOUNTER — Inpatient Hospital Stay: Payer: BC Managed Care – PPO

## 2022-12-29 ENCOUNTER — Other Ambulatory Visit: Payer: Self-pay

## 2022-12-29 ENCOUNTER — Inpatient Hospital Stay (HOSPITAL_BASED_OUTPATIENT_CLINIC_OR_DEPARTMENT_OTHER): Payer: BC Managed Care – PPO | Admitting: Hematology & Oncology

## 2022-12-29 ENCOUNTER — Encounter: Payer: Self-pay | Admitting: Hematology & Oncology

## 2022-12-29 VITALS — BP 98/68 | HR 80 | Temp 98.0°F | Resp 19 | Ht 63.0 in | Wt 223.0 lb

## 2022-12-29 DIAGNOSIS — Z170 Estrogen receptor positive status [ER+]: Secondary | ICD-10-CM

## 2022-12-29 DIAGNOSIS — C50912 Malignant neoplasm of unspecified site of left female breast: Secondary | ICD-10-CM | POA: Diagnosis not present

## 2022-12-29 DIAGNOSIS — C50412 Malignant neoplasm of upper-outer quadrant of left female breast: Secondary | ICD-10-CM | POA: Diagnosis not present

## 2022-12-29 LAB — CBC WITH DIFFERENTIAL (CANCER CENTER ONLY)
Abs Immature Granulocytes: 0.09 10*3/uL — ABNORMAL HIGH (ref 0.00–0.07)
Basophils Absolute: 0.1 10*3/uL (ref 0.0–0.1)
Basophils Relative: 1 %
Eosinophils Absolute: 0.1 10*3/uL (ref 0.0–0.5)
Eosinophils Relative: 2 %
HCT: 31.8 % — ABNORMAL LOW (ref 36.0–46.0)
Hemoglobin: 10.6 g/dL — ABNORMAL LOW (ref 12.0–15.0)
Immature Granulocytes: 2 %
Lymphocytes Relative: 15 %
Lymphs Abs: 0.8 10*3/uL (ref 0.7–4.0)
MCH: 28.7 pg (ref 26.0–34.0)
MCHC: 33.3 g/dL (ref 30.0–36.0)
MCV: 86.2 fL (ref 80.0–100.0)
Monocytes Absolute: 0.5 10*3/uL (ref 0.1–1.0)
Monocytes Relative: 9 %
Neutro Abs: 3.6 10*3/uL (ref 1.7–7.7)
Neutrophils Relative %: 71 %
Platelet Count: 270 10*3/uL (ref 150–400)
RBC: 3.69 MIL/uL — ABNORMAL LOW (ref 3.87–5.11)
RDW: 13.6 % (ref 11.5–15.5)
WBC Count: 5.1 10*3/uL (ref 4.0–10.5)
nRBC: 0 % (ref 0.0–0.2)

## 2022-12-29 LAB — CMP (CANCER CENTER ONLY)
ALT: 23 U/L (ref 0–44)
AST: 16 U/L (ref 15–41)
Albumin: 4.3 g/dL (ref 3.5–5.0)
Alkaline Phosphatase: 61 U/L (ref 38–126)
Anion gap: 9 (ref 5–15)
BUN: 24 mg/dL — ABNORMAL HIGH (ref 6–20)
CO2: 22 mmol/L (ref 22–32)
Calcium: 9.5 mg/dL (ref 8.9–10.3)
Chloride: 106 mmol/L (ref 98–111)
Creatinine: 1.01 mg/dL — ABNORMAL HIGH (ref 0.44–1.00)
GFR, Estimated: >60 mL/min (ref >60)
Glucose, Bld: 105 mg/dL — ABNORMAL HIGH (ref 70–99)
Potassium: 4.3 mmol/L (ref 3.5–5.1)
Sodium: 137 mmol/L (ref 135–145)
Total Bilirubin: 0.4 mg/dL (ref 0.3–1.2)
Total Protein: 7.4 g/dL (ref 6.5–8.1)

## 2022-12-29 LAB — LIPID PANEL
Cholesterol: 193 mg/dL (ref 0–200)
HDL: 41 mg/dL (ref >40)
LDL Cholesterol: 128 mg/dL — ABNORMAL HIGH (ref 0–99)
Total CHOL/HDL Ratio: 4.7 RATIO
Triglycerides: 121 mg/dL (ref <150)
VLDL: 24 mg/dL (ref 0–40)

## 2022-12-29 LAB — LACTATE DEHYDROGENASE: LDH: 133 U/L (ref 98–192)

## 2022-12-29 MED ORDER — SODIUM CHLORIDE 0.9% FLUSH
10.0000 mL | INTRAVENOUS | Status: DC | PRN
  Administered 2022-12-29: 10 mL via INTRAVENOUS

## 2022-12-29 MED ORDER — HEPARIN SOD (PORK) LOCK FLUSH 100 UNIT/ML IV SOLN
500.0000 [IU] | Freq: Once | INTRAVENOUS | Status: AC
  Administered 2022-12-29: 500 [IU] via INTRAVENOUS

## 2022-12-29 MED ORDER — LETROZOLE 2.5 MG PO TABS: 2.5000 mg | ORAL_TABLET | Freq: Every day | ORAL | 12 refills | Status: DC

## 2022-12-29 NOTE — Progress Notes (Signed)
Hematology and Oncology Follow Up Visit  Holly Leach [id number] 1970/10/23 52 y.o. 12/29/2022   Principle Diagnosis:  Stage IIA (T2N0M0) infiltrating ductal carcinoma of the left breast- ER+/PR-/HER2-  --Oncotype score equal 53  Current Therapy:   Lumpectomy on 07/08/2022 Taxotere/Cytoxan-adjuvant therapy-s/p cycle 4/4  -- start on 08/06/2022 --completed on 10/15/2022 Radiation therapy-completed on 12/17/2022 Femara 2.5 mg p.o. daily-start on 12/30/2022     Interim History:  Holly Leach is back for follow-up.  She has not completed radiation therapy.  We will go ahead and get her on Femara.  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  She has some skin peeling.  She was put on some Silvadene cream.  Everything seems to be healing up now.  She did have genetic testing done.  Hopefully, the results will come back in a couple weeks.  Hopefully, she will not be BRCA positive.  She has had no problems with bowels or bladder.  She has had no bleeding.  Is been no cough or shortness of breath.  She has had no dysphagia or odynophagia.  She has had no leg swelling.  There is been no rashes.  Overall, I would say that her performance status is probably ECOG 1.     Medications:  Current Outpatient Medications:    ALPRAZolam (XANAX) 0.25 MG tablet, Take 1 tablet (0.25 mg total) by mouth at bedtime as needed for anxiety., Disp: 30 tablet, Rfl: 0   Bacillus Coagulans-Inulin (PROBIOTIC-PREBIOTIC PO), Take 2.9 g by mouth daily., Disp: , Rfl:    carvedilol (COREG) 3.125 MG tablet, Take 3.125 mg by mouth 2 (two) times daily., Disp: , Rfl:    COLLAGEN PO, Take 1 tablet by mouth daily. New skin collagen, Disp: , Rfl:    famotidine (PEPCID) 40 MG tablet, Take 1 tablet (40 mg total) by mouth daily., Disp: 30 tablet, Rfl: 0   fluticasone (FLONASE) 50 MCG/ACT nasal spray, Place 2 sprays into both nostrils daily as needed for allergies., Disp: , Rfl:    Levothyroxine Sodium 25 MCG CAPS, Take 25 mcg by mouth daily  before breakfast., Disp: , Rfl:    lidocaine-prilocaine (EMLA) cream, Apply a dime size to port-a-cath 1-2 hours prior to access. Cover with [employer]., Disp: 30 g, Rfl: 2   lisinopril-hydrochlorothiazide (ZESTORETIC) 20-25 MG tablet, Take 1 tablet by mouth 2 (two) times daily., Disp: , Rfl:    loratadine (CLARITIN) 10 MG tablet, Take 10 mg by mouth daily., Disp: , Rfl:    megestrol (MEGACE) 20 MG tablet, Take 2 tablets (40 mg total) by mouth daily. Take 1 tablet (20 mg total) by mouth daily for hot flashes., Disp: 60 tablet, Rfl: 5   Melatonin 10 MG TABS, Take 10 mg by mouth at bedtime., Disp: , Rfl:    montelukast (SINGULAIR) 10 MG tablet, Take 10 mg by mouth daily., Disp: , Rfl:    ondansetron (ZOFRAN) 8 MG tablet, Take 1 tablet (8 mg total) by mouth every 8 (eight) hours as needed for nausea or vomiting. Start on the third day after chemotherapy., Disp: 30 tablet, Rfl: 1   prochlorperazine (COMPAZINE) 10 MG tablet, Take 1 tablet (10 mg total) by mouth every 6 (six) hours as needed for nausea or vomiting., Disp: 30 tablet, Rfl: 1   silver sulfADIAZINE (SILVADENE) 1 % cream, Apply 1 Application topically 2 (two) times daily., Disp: 50 g, Rfl: 1   UNABLE TO FIND, Cranial Prosthesis, Disp: 1 Units, Rfl: 0   Vitamin D, Ergocalciferol, (DRISDOL) 1.25  MG (50000 UNIT) CAPS capsule, Take 50,000 Units by mouth once a week., Disp: , Rfl:  No current facility-administered medications for this visit.  Facility-Administered Medications Ordered in Other Visits:    sodium chloride flush (NS) 0.9 % injection 10 mL, 10 mL, Intravenous, PRN, Josph Macho, MD, 10 mL at 12/29/22 0919  Allergies:  Allergies  Allergen Reactions   Augmentin [Amoxicillin-Pot Clavulanate] Diarrhea    Yeast Infection    Past Medical History, Surgical history, Social history, and Family History were reviewed and updated.  Review of Systems: Review of Systems  Constitutional: Negative.   HENT:  Negative.    Eyes: Negative.    Respiratory: Negative.    Cardiovascular: Negative.   Gastrointestinal: Negative.   Endocrine: Negative.   Genitourinary: Negative.    Musculoskeletal: Negative.   Skin: Negative.   Neurological: Negative.   Hematological: Negative.   Psychiatric/Behavioral: Negative.      Physical Exam:  height is 5\' 3"  (1.6 m) and weight is 223 lb (101.2 kg). Her oral temperature is 98 F (36.7 C). Her blood pressure is 98/68 and her pulse is 80. Her respiration is 19 and oxygen saturation is 100%.   Wt Readings from Last 3 Encounters:  12/29/22 223 lb (101.2 kg)  12/21/22 222 lb (100.7 kg)  11/26/22 225 lb 9.6 oz (102.3 kg)    Physical Exam Vitals reviewed.  Constitutional:      Comments: Breast exam shows right breast no masses, edema or erythema.  There is no right axillary adenopathy.  Left breast shows the lumpectomy at about the 2 o'clock position.  She has some hyperpigmentation and some skin peeling on the left breast.  There is some erythema at the lumpectomy site.  There may be a little bit of swelling.  I see no exudates. She has no left axillary adenopathy.  HENT:     Head: Normocephalic and atraumatic.  Eyes:     Pupils: Pupils are equal, round, and reactive to light.  Cardiovascular:     Rate and Rhythm: Normal rate and regular rhythm.     Heart sounds: Normal heart sounds.  Pulmonary:     Effort: Pulmonary effort is normal.     Breath sounds: Normal breath sounds.  Abdominal:     General: Bowel sounds are normal.     Palpations: Abdomen is soft.  Musculoskeletal:        General: No tenderness or deformity. Normal range of motion.     Cervical back: Normal range of motion.  Lymphadenopathy:     Cervical: No cervical adenopathy.  Skin:    General: Skin is warm and dry.     Findings: No erythema or rash.  Neurological:     Mental Status: She is alert and oriented to person, place, and time.  Psychiatric:        Behavior: Behavior normal.        Thought Content:  Thought content normal.        Judgment: Judgment normal.     Lab Results  Component Value Date   WBC 5.1 12/29/2022   HGB 10.6 (L) 12/29/2022   HCT 31.8 (L) 12/29/2022   MCV 86.2 12/29/2022   PLT 270 12/29/2022     Chemistry      Component Value Date/Time   NA 137 11/19/2022 0915   K 4.5 11/19/2022 0915   CL 107 11/19/2022 0915   CO2 20 (L) 11/19/2022 0915   BUN 24 (H) 11/19/2022 0915   CREATININE 1.10 (  H) 11/19/2022 0915      Component Value Date/Time   CALCIUM 9.6 11/19/2022 0915   ALKPHOS 59 11/19/2022 0915   AST 15 11/19/2022 0915   ALT 16 11/19/2022 0915   BILITOT 0.4 11/19/2022 0915       Impression and Plan: Ms. Iredale is a very nice 52 year old perimenopausal white female.  She has a fairly high-grade stage IIa ductal carcinoma of the left breast.  This has a very high Oncotype score.  Has a very high proliferation index.  We now are in the final "phase" for her.  We will not get her on Femara.  I think she should do well on the Femara.  I do not see that we have to put her on a CDK4/CDK6  inhibitor.  I would like to go ahead and get her back in 6 weeks.  If she still has her Port-A-Cath in.  We may think about getting this depending on what we find with her molecular studies.  Josph Macho, MD 7/30/20249:30 AM

## 2022-12-29 NOTE — Patient Instructions (Signed)

## 2023-01-04 ENCOUNTER — Encounter: Payer: Self-pay | Admitting: *Deleted

## 2023-01-04 ENCOUNTER — Ambulatory Visit: Payer: BC Managed Care – PPO | Attending: Radiation Oncology

## 2023-01-04 ENCOUNTER — Encounter: Payer: Self-pay | Admitting: Genetic Counselor

## 2023-01-04 ENCOUNTER — Telehealth: Payer: Self-pay | Admitting: Genetic Counselor

## 2023-01-04 DIAGNOSIS — M25612 Stiffness of left shoulder, not elsewhere classified: Secondary | ICD-10-CM | POA: Insufficient documentation

## 2023-01-04 DIAGNOSIS — Z170 Estrogen receptor positive status [ER+]: Secondary | ICD-10-CM | POA: Insufficient documentation

## 2023-01-04 DIAGNOSIS — R293 Abnormal posture: Secondary | ICD-10-CM | POA: Insufficient documentation

## 2023-01-04 DIAGNOSIS — Z483 Aftercare following surgery for neoplasm: Secondary | ICD-10-CM | POA: Diagnosis present

## 2023-01-04 DIAGNOSIS — C50412 Malignant neoplasm of upper-outer quadrant of left female breast: Secondary | ICD-10-CM | POA: Insufficient documentation

## 2023-01-04 DIAGNOSIS — R600 Localized edema: Secondary | ICD-10-CM | POA: Diagnosis present

## 2023-01-04 DIAGNOSIS — Z1379 Encounter for other screening for genetic and chromosomal anomalies: Secondary | ICD-10-CM | POA: Insufficient documentation

## 2023-01-04 NOTE — Therapy (Signed)
OUTPATIENT PHYSICAL THERAPY  UPPER EXTREMITY ONCOLOGY EVALUATION  Patient Name: Holly Leach MRN: [id number] DOB:09/25/1970, 52 y.o., female Today's Date: 01/04/2023  END OF SESSION:  PT End of Session - 01/04/23 1457     Visit Number 3    Number of Visits 12    Date for PT Re-Evaluation 01/20/23    PT Start Time 1500    PT Stop Time 1530    PT Time Calculation (min) 30 min    Activity Tolerance Patient tolerated treatment well    Behavior During Therapy WFL for tasks assessed/performed             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   Past Surgical History:  Procedure Laterality Date   BREAST BIOPSY Left 06/19/2022   [place] LT BREAST BX W LOC DEV 1ST LESION IMG BX SPEC US GUIDE 06/19/2022 [facility]   BREAST BIOPSY  07/07/2022   MM LT RADIOACTIVE SEED LOC MAMMO GUIDE 07/07/2022 [facility]   BREAST LUMPECTOMY WITH RADIOACTIVE SEED AND SENTINEL LYMPH NODE BIOPSY Left 07/08/2022   Procedure: LEFT BREAST LUMPECTOMY WITH RADIOACTIVE SEED AND SENTINEL LYMPH NODE BIOPSY;  Surgeon: Almond Lint, MD;  Location: [facility];  Service: General;  Laterality: Left;   COLONOSCOPY     COLPOSCOPY  2006   FINE NEEDLE ASPIRATION Left 07/28/2022   Procedure: ASPIRATION OF LEFT BREAST SEROMA;  Surgeon: Almond Lint, MD;  Location: [facility] OR;  Service: General;  Laterality: Left;   FRACTURE SURGERY Left 2020   foot - pt denies having surgery for this   HERNIA REPAIR     umbilical   PORTACATH PLACEMENT Left 07/28/2022   Procedure: INSERTION  PORT-A-CATH WITH ULTRASOUND GUIDANCE;  Surgeon: Almond Lint, MD;  Location: [facility] OR;  Service: General;  Laterality: Left;   Patient Active Problem List   Diagnosis Date Noted   Stage II breast cancer, left (HCC) 07/27/2022   Neck mass 11/03/2013   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/03/2013    PCP: Nadyne Coombes, MD  REFERRING PROVIDER: Dr. Lonie Peak  REFERRING DIAG: s/p Left Breast Cancer with cording  THERAPY DIAG:  Malignant neoplasm of upper-outer quadrant of left breast in female, estrogen receptor positive (HCC)  Localized edema  Aftercare following surgery for neoplasm  Stiffness of left shoulder, not elsewhere classified  Abnormal posture  ONSET DATE: November 02, 2022  Rationale for Evaluation and Treatment: Rehabilitation  SUBJECTIVE:  SUBJECTIVE STATEMENT:  That pad you made was a Advertising copywriter.  I used the Silvadene cream until yesterday. Saw Dr. Alvester Morin and she didn't like the way my skin looked so 2 nurses came up and dressed it for me .Range of motion is getting better. I think I still have a little breast swelling. I have a compression bra but I can't do it myself. I had a massage on Friday and I am still sore, but hurt a lot on Saturday. I still get intermittent sharp pain in the left breast but the MD said that is normal. I feel like I can make today my last day. I have to leave at 3:30 because I have another appt.  PERTINENT HISTORY:  Patient was diagnosed on 06/19/22 with left grade 2. It measures 1.4 x1.3 x 1.1 cm and is located in the upper outer quadrant. It is ER+, PR-, HER2-  with a Ki67 of 85%. Pt underwent a  L breast lumpectomy and SLNB on 07/08/22 with 0+/3 LN, followed by 4 cycles of chemo and is presently having radiation.. Radiation completed July 18 PAIN:  Are you having pain? No, it is much  better   PRECAUTIONS: Left UE lymphedema risk, right ACL tear  WEIGHT BEARING RESTRICTIONS: No  FALLS:  Has patient fallen in last 6 months? No  LIVING ENVIRONMENT: Lives with: lives with their spouse Lives in: House/apartment Has following equipment at home: None  OCCUPATION: Marine scientist at [employer]  LEISURE: Support Group  HAND DOMINANCE: right   PRIOR LEVEL OF FUNCTION: Independent  PATIENT GOALS: Decrease pain in axilla, and be able to reach better   OBJECTIVE:  COGNITION: Overall cognitive status: Within functional limits for tasks assessed   PALPATION: Fibrosis noted inferior to incision  OBSERVATIONS / OTHER ASSESSMENTS: no visible or palpable cording presently, significant redness from radiation  especially in left axillary/lateral trunk region, and with lighter redness throughout entire breast and beginnings of small blisters under the arm at axillary incision. Fibrosis noted around incision area of left breast  SENSATION: Light touch: Deficits    POSTURE: forward head, rounded shoulders  UPPER EXTREMITY AROM/PROM:  A/PROM RIGHT   eval   Shoulder extension 48  Shoulder flexion 159  Shoulder abduction 160  Shoulder internal rotation 55+  Shoulder external rotation 98    (Blank rows = not tested)  A/PROM LEFT   eval LEFT 01/04/2023  Shoulder extension 40 42  Shoulder flexion 126/152 after stretching 160  Shoulder abduction 105/ 125 after stretching 152  Shoulder internal rotation 54 60  Shoulder external rotation 77 109    (Blank rows = not tested)  CERVICAL AROM: All within functional limits:     UPPER EXTREMITY STRENGTH:   LYMPHEDEMA ASSESSMENTS:   SURGERY TYPE/DATE: 07/08/22 Left Lumpectomy with SLNB  NUMBER OF LYMPH NODES REMOVED: 0/3  CHEMOTHERAPY: YES 08/07/22 4 treatments every 3 weeks  RADIATION:Yes, Completed 12/17/2022 HORMONE TREATMENT: Pending  INFECTIONS: NO   LYMPHEDEMA ASSESSMENTS:   LANDMARK RIGHT  eval  At axilla     15 cm proximal to olecranon process   10 cm proximal to olecranon process 39.4  Olecranon process 30.9  15 cm proximal to ulnar styloid process   10 cm proximal to ulnar styloid process 27.5  Just proximal to ulnar styloid process 18.7  Across hand at thumb web space 19.3  At base of 2nd digit 6.5  (Blank rows = not tested)  LANDMARK LEFT  eval  At axilla    15 cm  proximal to olecranon process   10 cm proximal to olecranon process 39  Olecranon process 30.4  15 cm proximal to ulnar styloid process   10 cm proximal to ulnar styloid process 26.4  Just proximal to ulnar styloid process 16.8  Across hand at thumb web space 19.3  At base of 2nd digit 6.5  (Blank rows = not tested)     GAIT: Distance walked: 40 Assistive device utilized: None Level of assistance: Complete Independence Comments: WNL    QUICK DASH SURVEY: 32% Eval,  0% 01/04/2023   TODAY'S TREATMENT:                                                                                                                                          DATE:  01/04/2023 Skin greatly improved and no pain today Supine wand flexion and scaption x 5 PROM left shoulder flexion, abd, IR and ER Measured AROM Made several more foam pads for pt to use at left axillary region of bra and gave some TG soft to cover Check left breast; no enlarged pores or fibrosis except what feels like scar tissue inferior to incision;MD had also recently checked and indicated it was scar tissue Assessed goals SOZO screens taking place today and pt did not have time to wait. Will call tomorrow to arrange Discussed Compression bra but pt requires help to get it on. She does not appear to be very swollen today, but she will continue to watch for swelling. 12/15/2022 Checked pts skin; Very red under arm but is not cracking or pulling excessively with ROM Pulleys x 2 min flex, scaption Ball rolls on wall x 10 Supine AROM 3 D bilaterally x5 flexion,  scaption, horizontal abd in pain free ROM 1/4 inch foam pad in TG soft made for under pts arm to prevent rubbing, and pt fgiven several Telfa pads to use on skin Gentle PROM left shoulder flexion, scaption, abd, IR and ER with VC's to relax  12/09/22 PATIENT EDUCATION:  Education details: reviewed 4 post op exercises, initiated wand flexion and scaption, reviewed star gazer and wall slides all x 5 Person educated: Patient Education method: Explanation and Handouts Education comprehension: verbalized understanding and returned demonstration  HOME EXERCISE PROGRAM: Supine wand flex and scaption, stargazer and scap. Retraction. Discussed compression bra when radiation is over and skin is less tender  ASSESSMENT:  CLINICAL IMPRESSION: Pts skin has recovered nicely from her radiation as has her pain. She wanted to make today her last day. Goals were assessed and she has achieved all established goals. ROM has improved significantly for all, but abd continues with mild limitation. She is compliant with her HEP, and her Neldon Mc is now 0.  Her pain/tightness has improved greater than 50% overall. She will call to set up a date for SOZO screen as she had to leave her appt early today to get to  an appt at [facility]. Pt knows to contact us with any questions or concerns. She is discharged.  OBJECTIVE IMPAIRMENTS: decreased activity tolerance, decreased knowledge of condition, decreased ROM, decreased strength, impaired UE functional use, and postural dysfunction, pain   ACTIVITY LIMITATIONS: sleeping, reach over head, and hygiene/grooming  PARTICIPATION LIMITATIONS: cleaning, laundry, and shopping  PERSONAL FACTORS: 3+ comorbidities: Left Breast Cancer s/p chemo and radiation  are also affecting patient's functional outcome.   REHAB POTENTIAL: Good  CLINICAL DECISION MAKING: Stable/uncomplicated  EVALUATION COMPLEXITY: Low  GOALS: Goals reviewed with patient? Yes  SHORT TERM  GOALS=LONG TEM GOALS: Target date: 01/20/2023  Pt will be independent and compliant with HEP to improve shoulder ROM and strength Baseline: Goal status: MET, 01/04/2023 2.  Pts quick dash will be improved to no greater than 10% Baseline:  Goal status: MET 01/04/2023 3.  Pt will have left shoulder ROM improved to within 5-10 degrees of right for improved function Baseline:  Goal status: MET  01/04/2023 4.  Pt will have decreased pain and tightness by 50% for improved activity tolerance Baseline:  Goal status: MET 01/04/2023   PLAN:  PT FREQUENCY: 1-2x/week  PT DURATION: 6 weeks  PLANNED INTERVENTIONS: Therapeutic exercises, Therapeutic activity, Patient/Family education, Self Care, Joint mobilization, scar mobilization, Manual therapy, and Re-evaluation  PLAN FOR NEXT SESSION: DC to independent with HEP. Needs SOZO screen. Pt will call tomorrow.  PHYSICAL THERAPY DISCHARGE SUMMARY  Visits from Start of Care: 3  Current functional level related to goals / functional outcomes: Achieved all goals   Remaining deficits: Very mild limitation in shoulder abd   Education / Equipment: HEP   Patient agrees to discharge. Patient goals were met. Patient is being discharged due to meeting the stated rehab goals.and being pleased with her progress.  Waynette Buttery, PT 01/04/2023, 3:38 PM

## 2023-01-04 NOTE — Telephone Encounter (Addendum)
I attempted to contact Ms. Holly Leach to discuss her genetic testing results (50 genes). I left a voicemail requesting she call me back at [phone].  Lalla Brothers, MS, [facility] Genetic Counselor [email or web address] (P) [phone]

## 2023-01-04 NOTE — Progress Notes (Signed)
Patient completed radiation and started on AI.   Oncology Nurse Navigator Documentation     01/04/2023    8:00 AM  Oncology Nurse Navigator Flowsheets  Phase of Treatment AI  Aromatase Inhibitor Actual Start Date: 12/29/2022  Radiation Actual End Date: 12/17/2022  Navigator Follow Up Date: 02/09/2023  Navigator Follow Up Reason: Follow-up Appointment  Navigator Location [facility]  Navigator Encounter Type Appt/Treatment Plan Review  Patient Visit Type MedOnc  Treatment Phase Active Tx  Barriers/Navigation Needs No Barriers At This Time  Interventions None Required  Acuity Level 1-No Barriers  Support Groups/Services Friends and Family  Time Spent with Patient 15

## 2023-01-05 ENCOUNTER — Encounter: Payer: Self-pay | Admitting: Genetic Counselor

## 2023-01-05 ENCOUNTER — Telehealth: Payer: Self-pay | Admitting: Genetic Counselor

## 2023-01-05 NOTE — Telephone Encounter (Signed)
I contacted Ms. Slimp to discuss her genetic testing results. No pathogenic variants were identified in the 50 genes analyzed. 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 Detailed clinic note to follow.  The test report has been scanned into EPIC and is located under the Molecular Pathology section of the Results Review tab.  A portion of the result report is included below for reference.   Lalla Brothers, MS, [facility] Genetic Counselor [email or web address] (P) [phone]

## 2023-01-06 ENCOUNTER — Ambulatory Visit: Payer: BC Managed Care – PPO | Admitting: Physical Therapy

## 2023-01-07 ENCOUNTER — Ambulatory Visit: Payer: BC Managed Care – PPO

## 2023-01-07 ENCOUNTER — Encounter: Payer: Self-pay | Admitting: Psychiatry

## 2023-01-11 ENCOUNTER — Ambulatory Visit: Payer: Self-pay | Admitting: Genetic Counselor

## 2023-01-11 DIAGNOSIS — Z1379 Encounter for other screening for genetic and chromosomal anomalies: Secondary | ICD-10-CM

## 2023-01-11 NOTE — Progress Notes (Signed)
HPI:   Holly Leach was previously seen in the [facility] due to a personal and family history of cancer and concerns regarding a hereditary predisposition to cancer. Please refer to our prior cancer genetics clinic note for more information regarding our discussion, assessment and recommendations, at the time. Holly Leach's recent genetic test results were disclosed to her, as were recommendations warranted by these results. These results and recommendations are discussed in more detail below.  CANCER HISTORY:  Oncology History  Malignant neoplasm of upper-outer quadrant of left breast in female, estrogen receptor positive (HCC) (Resolved)  06/24/2022 Initial Diagnosis   Malignant neoplasm of upper-outer quadrant of left breast in female, estrogen receptor positive (HCC)   06/30/2022 Cancer Staging   Staging form: Breast, AJCC 8th Edition - Clinical: Stage IA (cT1c, cN0, cM0, G2, ER+, PR-, HER2-) - Signed by Erven Colla, PA-C on 06/30/2022 Stage prefix: Initial diagnosis Method of lymph node assessment: Clinical Histologic grading system: 3 grade system   Stage II breast cancer, left (HCC)  07/27/2022 Initial Diagnosis   Stage II breast cancer, left (HCC)   07/27/2022 Cancer Staging   Staging form: Breast, AJCC 8th Edition - Clinical stage from 07/27/2022: Stage IIB (cT2, cN0, cM0, G3, ER+, PR-, HER2-) - Signed by Josph Macho, MD on 07/27/2022 Nuclear grade: G3 Histologic grading system: 3 grade system   08/13/2022 -  Chemotherapy   Patient is on Treatment Plan : BREAST TC q21d      Genetic Testing   [facility] Custom Panel+RNA was Negative. 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 Report date is 01/02/2023.   The Custom Hereditary Cancers Panel offered by [facility] includes sequencing and/or deletion duplication testing of the following 50 genes: APC, ATM, AXIN2, BAP1, BARD1, BMPR1A, BRCA1, BRCA2, BRIP1, CDH1, CDK4, CDKN2A  (p14ARF and p16INK4a only), CHEK2, CTNNA1, DICER1, EPCAM (Deletion/duplication testing only), FH, FLCN, GREM1 (promoter region duplication testing only), HOXB13, KIT, MBD4, MEN1, MET, MLH1, MSH2, MSH3, MSH6, MUTYH, NF1, NHTL1, PALB2, PDGFRA, PMS2, POLD1, POLE, PTEN, RAD51C, RAD51D, SDHA (sequencing analysis only except exon 14), SDHB, SDHC, SDHD, SMAD4, SMARCA4. STK11, TP53, TSC1, TSC2, and VHL.     FAMILY HISTORY:  We obtained a detailed, 4-generation family history.  Significant diagnoses are listed below:      Family History  Problem Relation Age of Onset   Hypertension Mother     Stroke Mother     Hypertension Father     Heart disease Father     Irritable bowel syndrome Sister     Hypertension Brother     Hyperlipidemia Brother     Stomach cancer Maternal Grandfather     Diabetes Paternal Grandmother     Irritable bowel syndrome Maternal Aunt     Breast cancer Other          Maternal Great Aunt   Colon cancer Neg Hx     Colon polyps Neg Hx     Esophageal cancer Neg Hx     Rectal cancer Neg Hx     Ovarian cancer Neg Hx     Endometrial cancer Neg Hx             Holly Leach's maternal uncle was diagnosed with prostate cancer at an unknown age, he died at age 31. A second maternal uncle was diagnosed with liver cancer at age 76, he died at age 35. Her maternal great aunt was diagnosed with breast cancer at an unknown  age. This great aunt's daughter (her maternal first cousin once removed) was diagnosed with breast cancer at an unknown age, she reportedly had negative genetic testing. Her maternal grandfather was diagnosed with kidney cancer at an unknown age, he is deceased. There is no reported Ashkenazi Jewish ancestry.    GENETIC TEST RESULTS:  The [facility] Custom Panel found no pathogenic mutations.   The Custom Hereditary Cancers Panel offered by [facility] includes sequencing and/or deletion duplication testing of the following 50 genes: APC, ATM, AXIN2, BAP1, BARD1, BMPR1A,  BRCA1, BRCA2, BRIP1, CDH1, CDK4, CDKN2A (p14ARF and p16INK4a only), CHEK2, CTNNA1, DICER1, EPCAM (Deletion/duplication testing only), FLCN, FH, GREM1 (promoter region duplication testing only), HOXB13, KIT, MBD4, MEN1, MET, MLH1, MSH2, MSH3, MSH6, MUTYH, NF1, NHTL1, PALB2, PDGFRA, PMS2, POLD1, POLE, PTEN, RAD51C, RAD51D, SDHA (sequencing analysis only except exon 14), SDHB, SDHC, SDHD, SMAD4, SMARCA4. STK11, TP53, TSC1, TSC2, and VHL.   The test report has been scanned into EPIC and is located under the Molecular Pathology section of the Results Review tab.  A portion of the result report is included below for reference. Genetic testing reported out on 01/02/2023.    Genetic testing identified a variant of uncertain significance (VUS) in the APC gene called c.317G>A.  At this time, it is unknown if this variant is associated with an increased risk for cancer or if it is benign, but most uncertain variants are reclassified to benign. It should not be used to make medical management decisions. With time, we suspect the laboratory will determine the significance of this variant, if any. If the laboratory reclassifies this variant, we will attempt to contact Holly Leach to discuss it further.   Even though a pathogenic variant was not identified, possible explanations for the cancer in the family may include: There may be no hereditary risk for cancer in the family. The cancers in Holly Leach and/or her family may be due to other genetic or environmental factors. There may be a gene mutation in one of these genes that current testing methods cannot detect, but that chance is small. There could be another gene that has not yet been discovered, or that we have not yet tested, that is responsible for the cancer diagnoses in the family.   Therefore, it is important to remain in touch with cancer genetics in the future so that we can continue to offer Holly Leach the most up to date genetic testing.   ADDITIONAL  GENETIC TESTING:  We discussed with Holly Leach that her genetic testing was fairly extensive.  If there are genes identified to increase cancer risk that can be analyzed in the future, we would be happy to discuss and coordinate this testing at that time.    CANCER SCREENING RECOMMENDATIONS:  Holly Leach's test result is considered negative (normal).  This means that we have not identified a hereditary cause for her personal and family history of cancer at this time.  An individual's cancer risk and medical management are not determined by genetic test results alone. Overall cancer risk assessment incorporates additional factors, including personal medical history, family history, and any available genetic information that may result in a personalized plan for cancer prevention and surveillance. Therefore, it is recommended she continue to follow the cancer management and screening guidelines provided by her oncology and primary healthcare provider.  RECOMMENDATIONS FOR FAMILY MEMBERS:   Since she did not inherit a mutation in a cancer predisposition gene included on this panel, her son could not have inherited a  mutation from her in one of these genes. Individuals in this family might be at some increased risk of developing cancer, over the general population risk, due to the family history of cancer. We recommend women in this family have a yearly mammogram beginning at age 37, or 17 years younger than the earliest onset of cancer, an annual clinical breast exam, and perform monthly breast self-exams.  We do not recommend familial testing for the APC variant of uncertain significance (VUS).  FOLLOW-UP:  Cancer genetics is a rapidly advancing field and it is possible that new genetic tests will be appropriate for her and/or her family members in the future. We encouraged her to remain in contact with cancer genetics on an annual basis so we can update her personal and family histories and let her know  of advances in cancer genetics that may benefit this family.   Our contact number was provided. Holly Leach's questions were answered to her satisfaction, and she knows she is welcome to call us at anytime with additional questions or concerns.   Lalla Brothers, MS, [facility] Genetic Counselor [email or web address] (P) [phone]

## 2023-01-12 ENCOUNTER — Ambulatory Visit (HOSPITAL_COMMUNITY)
Admission: RE | Admit: 2023-01-12 | Discharge: 2023-01-12 | Disposition: A | Discharge status: Home / Self Care | Payer: BC Managed Care – PPO | Source: Ambulatory Visit | Attending: Internal Medicine | Admitting: Internal Medicine

## 2023-01-12 DIAGNOSIS — R0602 Shortness of breath: Secondary | ICD-10-CM | POA: Diagnosis not present

## 2023-01-12 DIAGNOSIS — Z9221 Personal history of antineoplastic chemotherapy: Secondary | ICD-10-CM | POA: Insufficient documentation

## 2023-01-12 LAB — ECHOCARDIOGRAM COMPLETE
AR max vel: 2.3 cm2
AV Area VTI: 2.31 cm2
AV Area mean vel: 2.28 cm2
AV Mean grad: 4 mmHg
AV Peak grad: 6.7 mmHg
Ao pk vel: 1.3 m/s
Area-P 1/2: 3.39 cm2
Calc EF: 61 %
MV VTI: 2.64 cm2
S' Lateral: 3 cm
Single Plane A2C EF: 61.1 %
Single Plane A4C EF: 61.1 %

## 2023-01-12 NOTE — Progress Notes (Signed)
[name] presents today for follow-up after completing radiation to her left breast on 12-17-22.   Pain: continues to have sharp shooting pains intermittently though this has improved Skin: healed well, encouraged use of vitamin E cream/lotion for two months Fatigue: remains but is improving ROM: normally, her rom improved well after radiation, she was released from physical therapy for this reason Lymphedema: none MedOnc F/U: Dr. Myna Hidalgo on 02-09-23 Other issues of note: No major concerns at this time. She did have a question about how to get the survivorship certificate. A fellow breast cancer pt had one and she would like information on how to get one. Rn will look into this.   Pt reports Yes No Comments  Tamoxifen []  [x]    Letrozole [x]  []  2.5mg   Anastrazole []  [x]    Mammogram []  Date:  [] 

## 2023-01-18 ENCOUNTER — Telehealth: Payer: Self-pay | Admitting: Internal Medicine

## 2023-01-18 NOTE — Telephone Encounter (Signed)
Returned pt's call- she said that she had received a call about her Echocardiogram that everything looked good, but then someone else called back about the report??  Read over the results of the report with this patient. Told her that the Echo was normal. She verbalized understanding.

## 2023-01-18 NOTE — Telephone Encounter (Signed)
Patient was returning call. Please advise ?

## 2023-01-21 ENCOUNTER — Ambulatory Visit
Admission: RE | Admit: 2023-01-21 | Discharge: 2023-01-21 | Disposition: A | Discharge status: Home / Self Care | Payer: BC Managed Care – PPO | Source: Ambulatory Visit | Attending: Radiation Oncology | Admitting: Radiation Oncology

## 2023-01-21 ENCOUNTER — Telehealth: Payer: Self-pay

## 2023-01-21 ENCOUNTER — Encounter: Payer: Self-pay | Admitting: Radiation Oncology

## 2023-01-21 NOTE — Telephone Encounter (Signed)
Pt called for telephone follow up. Note completed and routed to Dr. Squire for review.  

## 2023-01-22 ENCOUNTER — Encounter: Payer: Self-pay | Admitting: *Deleted

## 2023-01-25 ENCOUNTER — Ambulatory Visit: Payer: BC Managed Care – PPO

## 2023-02-02 ENCOUNTER — Encounter: Payer: Self-pay | Admitting: Physical Therapy

## 2023-02-02 ENCOUNTER — Other Ambulatory Visit: Payer: Self-pay | Admitting: General Surgery

## 2023-02-02 ENCOUNTER — Ambulatory Visit: Payer: BC Managed Care – PPO | Attending: General Surgery | Admitting: Physical Therapy

## 2023-02-02 DIAGNOSIS — Z170 Estrogen receptor positive status [ER+]: Secondary | ICD-10-CM | POA: Insufficient documentation

## 2023-02-02 DIAGNOSIS — C50412 Malignant neoplasm of upper-outer quadrant of left female breast: Secondary | ICD-10-CM | POA: Insufficient documentation

## 2023-02-02 NOTE — Therapy (Signed)
OUTPATIENT PHYSICAL THERAPY SOZO SCREENING NOTE   Patient Name: Holly Leach MRN: [id number] DOB:Sep 15, 1970, 52 y.o., female Today's Date: 02/02/2023  PCP: Dois Davenport, MD REFERRING PROVIDER: Lonie Peak, MD   PT End of Session - 02/02/23 1602     Visit Number 3   unchanged due to screen only   PT Start Time 1601    PT Stop Time 1609    PT Time Calculation (min) 8 min    Activity Tolerance Patient tolerated treatment well    Behavior During Therapy WFL for tasks assessed/performed             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   Past Surgical History:  Procedure Laterality Date   BREAST BIOPSY Left 06/19/2022   [place] LT BREAST BX W LOC DEV 1ST LESION IMG BX SPEC US GUIDE 06/19/2022 [facility]   BREAST BIOPSY  07/07/2022   MM LT RADIOACTIVE SEED LOC MAMMO GUIDE 07/07/2022 [facility]   BREAST LUMPECTOMY WITH RADIOACTIVE SEED AND SENTINEL LYMPH NODE BIOPSY Left 07/08/2022   Procedure: LEFT BREAST LUMPECTOMY WITH RADIOACTIVE SEED AND SENTINEL LYMPH NODE BIOPSY;  Surgeon: Almond Lint, MD;  Location: [facility];  Service: General;  Laterality: Left;   COLONOSCOPY     COLPOSCOPY  2006   FINE NEEDLE ASPIRATION Left 07/28/2022   Procedure: ASPIRATION OF LEFT BREAST SEROMA;  Surgeon: Almond Lint, MD;  Location: [facility] OR;  Service: General;  Laterality: Left;   FRACTURE SURGERY Left 2020   foot - pt denies having surgery for this   HERNIA REPAIR     umbilical   PORTACATH PLACEMENT Left 07/28/2022   Procedure: INSERTION  PORT-A-CATH WITH ULTRASOUND GUIDANCE;  Surgeon: Almond Lint, MD;  Location: [facility];  Service: General;  Laterality: Left;   Patient Active Problem List   Diagnosis Date Noted   Genetic testing 01/04/2023   Stage II breast cancer, left (HCC) 07/27/2022   Neck mass 11/03/2013   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 It measures 1.4 x1.3 x 1.1 cm and is located in the upper outer quadrant. It is ER+, PR-, HER2-  with a Ki67 of 85%. Pt underwent a  L breast lumpectomy and SLNB on 07/08/22 with 0+/3 LN, followed by 4 cycles of chemo and is presently having radiation.. Radiation completed July 18   PRECAUTIONS: left UE Lymphedema risk,   SUBJECTIVE: I am not having any issues.  PAIN:  Are you having pain? No  SOZO SCREENING: Patient was assessed today using the SOZO machine to determine the lymphedema index score. This was compared to her baseline score. It was determined that she is within the recommended range when compared to her baseline and no  further action is needed at this time. She will continue SOZO screenings. These are done every 3 months for 2 years post operatively followed by every 6 months for 2 years, and then annually.    [facility] [place], PT 02/02/2023, 4:09 PM

## 2023-02-03 ENCOUNTER — Other Ambulatory Visit: Payer: Self-pay

## 2023-02-09 ENCOUNTER — Encounter: Payer: Self-pay | Admitting: Hematology & Oncology

## 2023-02-09 ENCOUNTER — Encounter: Payer: Self-pay | Admitting: *Deleted

## 2023-02-09 ENCOUNTER — Inpatient Hospital Stay (HOSPITAL_BASED_OUTPATIENT_CLINIC_OR_DEPARTMENT_OTHER): Payer: BC Managed Care – PPO | Admitting: Hematology & Oncology

## 2023-02-09 ENCOUNTER — Inpatient Hospital Stay: Payer: BC Managed Care – PPO | Attending: Hematology & Oncology

## 2023-02-09 ENCOUNTER — Inpatient Hospital Stay: Payer: BC Managed Care – PPO

## 2023-02-09 VITALS — BP 112/63 | HR 87 | Temp 98.0°F | Resp 18 | Wt 227.0 lb

## 2023-02-09 DIAGNOSIS — Z79890 Hormone replacement therapy (postmenopausal): Secondary | ICD-10-CM | POA: Insufficient documentation

## 2023-02-09 DIAGNOSIS — Z79899 Other long term (current) drug therapy: Secondary | ICD-10-CM | POA: Insufficient documentation

## 2023-02-09 DIAGNOSIS — C50912 Malignant neoplasm of unspecified site of left female breast: Secondary | ICD-10-CM

## 2023-02-09 DIAGNOSIS — Z79811 Long term (current) use of aromatase inhibitors: Secondary | ICD-10-CM | POA: Diagnosis not present

## 2023-02-09 DIAGNOSIS — Z923 Personal history of irradiation: Secondary | ICD-10-CM | POA: Diagnosis not present

## 2023-02-09 DIAGNOSIS — C50412 Malignant neoplasm of upper-outer quadrant of left female breast: Secondary | ICD-10-CM | POA: Insufficient documentation

## 2023-02-09 DIAGNOSIS — Z170 Estrogen receptor positive status [ER+]: Secondary | ICD-10-CM | POA: Diagnosis not present

## 2023-02-09 DIAGNOSIS — Z9221 Personal history of antineoplastic chemotherapy: Secondary | ICD-10-CM | POA: Diagnosis not present

## 2023-02-09 LAB — CMP (CANCER CENTER ONLY)
ALT: 21 U/L (ref 0–44)
AST: 17 U/L (ref 15–41)
Albumin: 4.5 g/dL (ref 3.5–5.0)
Alkaline Phosphatase: 66 U/L (ref 38–126)
Anion gap: 8 (ref 5–15)
BUN: 19 mg/dL (ref 6–20)
CO2: 25 mmol/L (ref 22–32)
Calcium: 10.5 mg/dL — ABNORMAL HIGH (ref 8.9–10.3)
Chloride: 104 mmol/L (ref 98–111)
Creatinine: 1.06 mg/dL — ABNORMAL HIGH (ref 0.44–1.00)
GFR, Estimated: >60 mL/min
Glucose, Bld: 118 mg/dL — ABNORMAL HIGH (ref 70–99)
Potassium: 4.5 mmol/L (ref 3.5–5.1)
Sodium: 137 mmol/L (ref 135–145)
Total Bilirubin: 0.4 mg/dL (ref 0.3–1.2)
Total Protein: 7.3 g/dL (ref 6.5–8.1)

## 2023-02-09 LAB — CBC WITH DIFFERENTIAL (CANCER CENTER ONLY)
Abs Immature Granulocytes: 0.04 10*3/uL (ref 0.00–0.07)
Basophils Absolute: 0.1 10*3/uL (ref 0.0–0.1)
Basophils Relative: 1 %
Eosinophils Absolute: 0.1 10*3/uL (ref 0.0–0.5)
Eosinophils Relative: 2 %
HCT: 33.2 % — ABNORMAL LOW (ref 36.0–46.0)
Hemoglobin: 10.8 g/dL — ABNORMAL LOW (ref 12.0–15.0)
Immature Granulocytes: 1 %
Lymphocytes Relative: 16 %
Lymphs Abs: 1 10*3/uL (ref 0.7–4.0)
MCH: 27.3 pg (ref 26.0–34.0)
MCHC: 32.5 g/dL (ref 30.0–36.0)
MCV: 83.8 fL (ref 80.0–100.0)
Monocytes Absolute: 0.5 10*3/uL (ref 0.1–1.0)
Monocytes Relative: 7 %
Neutro Abs: 4.7 10*3/uL (ref 1.7–7.7)
Neutrophils Relative %: 73 %
Platelet Count: 298 10*3/uL (ref 150–400)
RBC: 3.96 MIL/uL (ref 3.87–5.11)
RDW: 14.6 % (ref 11.5–15.5)
WBC Count: 6.4 10*3/uL (ref 4.0–10.5)
nRBC: 0 % (ref 0.0–0.2)

## 2023-02-09 LAB — LACTATE DEHYDROGENASE: LDH: 152 U/L (ref 98–192)

## 2023-02-09 LAB — VITAMIN D 25 HYDROXY (VIT D DEFICIENCY, FRACTURES): Vit D, 25-Hydroxy: 73.33 ng/mL (ref 30–100)

## 2023-02-09 MED ORDER — HEPARIN SOD (PORK) LOCK FLUSH 100 UNIT/ML IV SOLN
500.0000 [IU] | Freq: Once | INTRAVENOUS | Status: AC
  Administered 2023-02-09: 500 [IU] via INTRAVENOUS

## 2023-02-09 MED ORDER — ANASTROZOLE 1 MG PO TABS: 1.0000 mg | ORAL_TABLET | Freq: Every day | ORAL | 12 refills | Status: DC

## 2023-02-09 MED ORDER — SODIUM CHLORIDE 0.9% FLUSH
10.0000 mL | INTRAVENOUS | Status: DC | PRN
  Administered 2023-02-09: 10 mL via INTRAVENOUS

## 2023-02-09 NOTE — Patient Instructions (Signed)

## 2023-02-09 NOTE — Progress Notes (Signed)
Hematology and Oncology Follow Up Visit  Holly Leach [id number] 1970-09-29 52 y.o. 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 daily-start on 12/30/2022 -DC on 02/09/2023 Arimidex 1 mg p.o. daily-start on 02/15/2023     Interim History:  Holly Leach is back for follow-up.  She is having a tough time with the Femara.  She is having some dizziness.  She does some achiness with this.  She is not sleeping.  As such, we will go ahead and stop the Femara and try her on Arimidex.  I told her to stop the Femara and then a week later start the Arimidex.  Otherwise, she seems to be doing fairly well.  She is working.  She has a more energy.  She has more stamina.  She has had no problems with nausea or vomiting.  No change in bowel or bladder habits.  She has had no diarrhea.  She has had no rashes.  There is been no leg swelling.  She is taking a supplement.  I do not see a problem with her taking this supplement.  Currently, I would have said that her performance status is probably ECOG 0.    Medications:  Current Outpatient Medications:    Bacillus Coagulans-Inulin (PROBIOTIC-PREBIOTIC PO), Take 2.9 g by mouth daily., Disp: , Rfl:    carvedilol (COREG) 3.125 MG tablet, Take 3.125 mg by mouth 2 (two) times daily., Disp: , Rfl:    COLLAGEN PO, Take 1 tablet by mouth daily. New skin collagen, Disp: , Rfl:    famotidine (PEPCID) 40 MG tablet, Take 1 tablet (40 mg total) by mouth daily., Disp: 30 tablet, Rfl: 0   letrozole (FEMARA) 2.5 MG tablet, Take 1 tablet (2.5 mg total) by mouth daily., Disp: 30 tablet, Rfl: 12   Levothyroxine Sodium 25 MCG CAPS, Take 25 mcg by mouth daily before breakfast.,  Disp: , Rfl:    lidocaine-prilocaine (EMLA) cream, Apply a dime size to port-a-cath 1-2 hours prior to access. Cover with [employer]., Disp: 30 g, Rfl: 2   lisinopril-hydrochlorothiazide (ZESTORETIC) 20-25 MG tablet, Take 1 tablet by mouth 2 (two) times daily., Disp: , Rfl:    loratadine (CLARITIN) 10 MG tablet, Take 10 mg by mouth daily., Disp: , Rfl:    megestrol (MEGACE) 20 MG tablet, Take 2 tablets (40 mg total) by mouth daily. Take 1 tablet (20 mg total) by mouth daily for hot flashes., Disp: 60 tablet, Rfl: 5   Melatonin 10 MG TABS, Take 10 mg by mouth at bedtime., Disp: , Rfl:    montelukast (SINGULAIR) 10 MG tablet, Take 10 mg by mouth daily., Disp: , Rfl:    Vitamin D, Ergocalciferol, (DRISDOL) 1.25 MG (50000 UNIT) CAPS capsule, Take 50,000 Units by mouth once a week., Disp: , Rfl:    ALPRAZolam (XANAX) 0.25 MG tablet, Take 1 tablet (0.25 mg total) by mouth at bedtime as needed for anxiety. (Patient not taking: Reported on 02/09/2023), Disp: 30 tablet, Rfl: 0   fluticasone (FLONASE) 50 MCG/ACT nasal spray, Place 2 sprays into both nostrils daily as needed for allergies. (Patient not taking: Reported on 02/09/2023), Disp: , Rfl:    ondansetron (ZOFRAN) 8 MG tablet, Take 1 tablet (8 mg total) by mouth every 8 (eight) hours  as needed for nausea or vomiting. Start on the third day after chemotherapy. (Patient not taking: Reported on 02/09/2023), Disp: 30 tablet, Rfl: 1   prochlorperazine (COMPAZINE) 10 MG tablet, Take 1 tablet (10 mg total) by mouth every 6 (six) hours as needed for nausea or vomiting. (Patient not taking: Reported on 02/09/2023), Disp: 30 tablet, Rfl: 1   silver sulfADIAZINE (SILVADENE) 1 % cream, Apply 1 Application topically 2 (two) times daily. (Patient not taking: Reported on 02/09/2023), Disp: 50 g, Rfl: 1   UNABLE TO FIND, Cranial Prosthesis, Disp: 1 Units, Rfl: 0 No current facility-administered medications for this visit.  Facility-Administered Medications Ordered in Other  Visits:    sodium chloride flush (NS) 0.9 % injection 10 mL, 10 mL, Intravenous, PRN, Josph Macho, MD, 10 mL at 02/09/23 1044  Allergies:  Allergies  Allergen Reactions   Augmentin [Amoxicillin-Pot Clavulanate] Diarrhea    Yeast Infection    Past Medical History, Surgical history, Social history, and Family History were reviewed and updated.  Review of Systems: Review of Systems  Constitutional: Negative.   HENT:  Negative.    Eyes: Negative.   Respiratory: Negative.    Cardiovascular: Negative.   Gastrointestinal: Negative.   Endocrine: Negative.   Genitourinary: Negative.    Musculoskeletal: Negative.   Skin: Negative.   Neurological: Negative.   Hematological: Negative.   Psychiatric/Behavioral: Negative.      Physical Exam:  weight is 227 lb (103 kg). Her oral temperature is 98 F (36.7 C). Her blood pressure is 112/63 and her pulse is 87. Her respiration is 18 and oxygen saturation is 98%.   Wt Readings from Last 3 Encounters:  02/09/23 227 lb (103 kg)  12/29/22 223 lb (101.2 kg)  12/21/22 222 lb (100.7 kg)    Physical Exam Vitals reviewed.  Constitutional:      Comments: Breast exam shows right breast no masses, edema or erythema.  There is no right axillary adenopathy.  Left breast shows the lumpectomy at about the 2 o'clock position.  She has some hyperpigmentation and some skin peeling on the left breast.  There is some erythema at the lumpectomy site.  There may be a little bit of swelling.  I see no exudates. She has no left axillary adenopathy.  HENT:     Head: Normocephalic and atraumatic.  Eyes:     Pupils: Pupils are equal, round, and reactive to light.  Cardiovascular:     Rate and Rhythm: Normal rate and regular rhythm.     Heart sounds: Normal heart sounds.  Pulmonary:     Effort: Pulmonary effort is normal.     Breath sounds: Normal breath sounds.  Abdominal:     General: Bowel sounds are normal.     Palpations: Abdomen is soft.   Musculoskeletal:        General: No tenderness or deformity. Normal range of motion.     Cervical back: Normal range of motion.  Lymphadenopathy:     Cervical: No cervical adenopathy.  Skin:    General: Skin is warm and dry.     Findings: No erythema or rash.  Neurological:     Mental Status: She is alert and oriented to person, place, and time.  Psychiatric:        Behavior: Behavior normal.        Thought Content: Thought content normal.        Judgment: Judgment normal.    Lab Results  Component Value Date   WBC 6.4  02/09/2023   HGB 10.8 (L) 02/09/2023   HCT 33.2 (L) 02/09/2023   MCV 83.8 02/09/2023   PLT 298 02/09/2023     Chemistry      Component Value Date/Time   NA 137 02/09/2023 1036   K 4.5 02/09/2023 1036   CL 104 02/09/2023 1036   CO2 25 02/09/2023 1036   BUN 19 02/09/2023 1036   CREATININE 1.06 (H) 02/09/2023 1036      Component Value Date/Time   CALCIUM 10.5 (H) 02/09/2023 1036   ALKPHOS 66 02/09/2023 1036   AST 17 02/09/2023 1036   ALT 21 02/09/2023 1036   BILITOT 0.4 02/09/2023 1036       Impression and Plan: Ms. Roller is a very nice 52 year old perimenopausal white female.  She has a fairly high-grade stage IIa ductal carcinoma of the left breast.  This has a very high Oncotype score.  Has a very high proliferation index.  Again, we tried her on the Femara.  She had a hard time with Femara.  We will try her on Arimidex.  I told her that she can certainly have the same side effects with Arimidex since it is an aromatase inhibitor.  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  I would like to see her back in 6 weeks.  Hopefully all is well 6 weeks.  If so, then we will try to move her appointments out every 3 months.    Josph Macho, MD 9/10/202411:14 AM

## 2023-02-09 NOTE — Progress Notes (Signed)
Visited with patient in exam room. She is very happy to be done with treatment. She will have her port taken out in November. Her hair is starting to grow back.   Gave patient survivorship certificate, book and magazine.   Patient hasn't had navigational needs in some time. She is done with upfront treatment and is now on AI alone. Will discontinue active navigation at this time, but be available to the patient as needed in the future.   Oncology Nurse Navigator Documentation     02/09/2023   11:00 AM  Oncology Nurse Navigator Flowsheets  Navigation Complete Date: 02/09/2023  Post Navigation: Continue to Follow Patient? No  Reason Not Navigating Patient: Patient On Maintenance Chemotherapy  Navigator Location [facility]  Navigator Encounter Type Follow-up Appt  Patient Visit Type MedOnc  Treatment Phase Active Tx  Barriers/Navigation Needs No Barriers At This Time  Interventions Psycho-Social Support  Acuity Level 1-No Barriers  Support Groups/Services Friends and Family  Time Spent with Patient 15

## 2023-02-10 ENCOUNTER — Encounter: Payer: Self-pay | Admitting: *Deleted

## 2023-02-15 NOTE — Progress Notes (Unsigned)
Cardiology Office Note:    Date:  02/15/2023   ID:  Holly Leach, DOB 04/22/1971, MRN [id number]  PCP:  Dois Davenport, MD   [facility] Providers Cardiologist:  Maisie Fus, MD     Referring MD: Dois Davenport, MD   No chief complaint on file. SOB  History of Present Illness:    Holly Leach is a 52 y.o. female with a hx of Stage IIA L breast CA ER+/PR-Her2- s/p Taxotere/cytoxan-adjuvant therapy-s/p cycle 4/4 , will undergo left sided radiation,  managed on antiestrogen therapy, obesity who was referred to cardio oncology clinic for SOB Since chemo she has been short of breath with activity. She notes she was at [facility] in the parking lot and felt winded. She denies chest pain. She has no hx of CAD. No cardiac w/u prior. Her EKG in late January was normal  She is eating Chick file[employer] for breakfast. She wants to change to oatmeal and interested in [employer]   Interval hx 02/16/2023 She is breast CA free. She will get her port removed. She is doing well healthy wise. She denies angina, dyspnea on exertion, lower extremity edema, PND or orthopnea.     Cardio-Onc Hx - CVD Risk/ABCDE: DM2, A1c 5.6%. TC 267, LDL 185. Non Smoker. Obesity - Anthracycline therapy - No - Herceptin- No - Radiation Planned for L breast - ICI- no - TKI- no    The 10-year ASCVD risk score (Arnett DK, et al., 2019) is: 3.6%   Values used to calculate the score:     Age: 77 years     Sex: Female     Is Non-Hispanic African American: No     Diabetic: Yes     Tobacco smoker: No     Systolic Blood Pressure: 112 mmHg     Is BP treated: Yes     HDL Cholesterol: 41 mg/dL     Total Cholesterol: 193 mg/dL    Past Medical History:  Diagnosis Date   ACL tear    right knee   Cancer (HCC)    Chronic headaches    Diabetes mellitus without complication (HCC)    GERD (gastroesophageal reflux disease)    d/t trulicity   Hyperlipidemia    Hypertension    Hypothyroidism    OSA on  CPAP    PE (pulmonary embolism) 2006   Pre-diabetes    Stage II breast cancer, left (HCC) 07/27/2022   Thyroid disease    Tuberculosis    as a child 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    Past Surgical History:  Procedure Laterality Date   BREAST BIOPSY Left 06/19/2022   [place] LT BREAST BX W LOC DEV 1ST LESION IMG BX SPEC US GUIDE 06/19/2022 [facility]   BREAST BIOPSY  07/07/2022   MM LT RADIOACTIVE SEED LOC MAMMO GUIDE 07/07/2022 [facility]   BREAST LUMPECTOMY WITH RADIOACTIVE SEED AND SENTINEL LYMPH NODE BIOPSY Left 07/08/2022   Procedure: LEFT BREAST LUMPECTOMY WITH RADIOACTIVE SEED AND SENTINEL LYMPH NODE BIOPSY;  Surgeon: Almond Lint, MD;  Location: [facility];  Service: General;  Laterality: Left;   COLONOSCOPY     COLPOSCOPY  2006   FINE NEEDLE ASPIRATION Left 07/28/2022   Procedure: ASPIRATION OF LEFT BREAST SEROMA;  Surgeon: Almond Lint, MD;  Location: [facility];  Service: General;  Laterality: Left;   FRACTURE SURGERY Left 2020   foot - pt denies having surgery for this   HERNIA REPAIR  umbilical   PORTACATH PLACEMENT Left 07/28/2022   Procedure: INSERTION PORT-A-CATH WITH ULTRASOUND GUIDANCE;  Surgeon: Almond Lint, MD;  Location: [facility];  Service: General;  Laterality: Left;    Current Medications: No outpatient medications have been marked as taking for the 02/16/23 encounter (Appointment) with Maisie Fus, MD.     Allergies:   Augmentin [amoxicillin-pot clavulanate]   Social History   Socioeconomic History   Marital status: Married    Spouse name: Not on file   Number of children: 1   Years of education: Not on file   Highest education level: Not on file  Occupational History   Occupation: Environmental health practitioner  Tobacco Use   Smoking status: Never   Smokeless tobacco: Never  Vaping Use   Vaping status: Never Used  Substance and Sexual Activity   Alcohol use: No    Alcohol/week: 0.0 standard drinks of alcohol   Drug use:  No   Sexual activity: Yes  Other Topics Concern   Not on file  Social History Narrative   Not on file   Social Determinants of Health   Financial Resource Strain: Medium Risk (07/23/2022)   Overall Financial Resource Strain (CARDIA)    Difficulty of Paying Living Expenses: Somewhat hard  Food Insecurity: No Food Insecurity (06/30/2022)   Hunger Vital Sign    Worried About Running Out of Food in the Last Year: Never true    Ran Out of Food in the Last Year: Never true  Transportation Needs: No Transportation Needs (06/30/2022)   PRAPARE - Administrator, Civil Service (Medical): No    Lack of Transportation (Non-Medical): No  Physical Activity: Not on file  Stress: Not on file  Social Connections: Socially Integrated (07/23/2022)   Social Connection and Isolation Panel [NHANES]    Frequency of Communication with Friends and Family: More than three times a week    Frequency of Social Gatherings with Friends and Family: More than three times a week    Attends Religious Services: More than 4 times per year    Active Member of [employer] or Organizations: Yes    Attends Engineer, structural: More than 4 times per year    Marital Status: Married     Family History: The patient's family history includes Breast cancer in an other family member; Diabetes in her paternal grandmother; Heart disease in her father; Hyperlipidemia in her brother; Hypertension in her brother, father, and mother; Irritable bowel syndrome in her maternal aunt and sister; Stomach cancer in her maternal grandfather; Stroke in her mother. There is no history of Colon cancer, Colon polyps, Esophageal cancer, Rectal cancer, Ovarian cancer, or Endometrial cancer. Mother had a stroke  ROS:   Please see the history of present illness.     All other systems reviewed and are negative.  EKGs/Labs/Other Studies Reviewed:    The following studies were reviewed today:   Recent Labs: 10/15/2022: Magnesium  1.9 02/09/2023: ALT 21; BUN 19; Creatinine 1.06; Hemoglobin 10.8; Platelet Count 298; Potassium 4.5; Sodium 137  Recent Lipid Panel    Component Value Date/Time   CHOL 193 12/29/2022 0910   TRIG 121 12/29/2022 0910   HDL 41 12/29/2022 0910   CHOLHDL 4.7 12/29/2022 0910   VLDL 24 12/29/2022 0910   LDLCALC 128 (H) 12/29/2022 0910     Risk Assessment/Calculations:     Physical Exam:    VS:  There were no vitals taken for this visit.    Wt Readings from  Last 3 Encounters:  02/09/23 227 lb (103 kg)  12/29/22 223 lb (101.2 kg)  12/21/22 222 lb (100.7 kg)     GEN:  Well nourished, well developed in no acute distress HEENT: Normal CARDIAC: RRR, no murmurs, rubs, gallops RESPIRATORY:  Clear to auscultation without rales, wheezing or rhonchi  ABDOMEN: Soft, non-tender, non-distended MUSCULOSKELETAL:  No edema; No deformity  SKIN: Warm and dry NEUROLOGIC:  Alert and oriented x 3 PSYCHIATRIC:  Normal affect   ASSESSMENT:    SOB Deconditioning - Ddx includes HFpEF vs. deconditioning v. CAD. Occurred while she was undergoing chemo. She has no signs of CHF on exam. She has low risk factors for CAD and no signs of ischemia on her EKG. Can be deconditioning. We discussed healthy diet and importance of exercise; and encouraged to continue - TTE - normal EF, GLS, no pulmonary HTN. E/e' < 14. No pericardial effusion -  resolved/improved  Cardio-Oncology - low risk for CTRCD PLAN:    In order of problems listed above:   Follow up PRN     Medication Adjustments/Labs and Tests Ordered: Current medicines are reviewed at length with the patient today.  Concerns regarding medicines are outlined above.  No orders of the defined types were placed in this encounter.  No orders of the defined types were placed in this encounter.   There are no Patient Instructions on file for this visit.   Signed, Maisie Fus, MD  02/15/2023 11:28 AM    [facility]

## 2023-02-16 ENCOUNTER — Encounter: Payer: Self-pay | Admitting: Internal Medicine

## 2023-02-16 ENCOUNTER — Ambulatory Visit: Payer: BC Managed Care – PPO | Attending: Internal Medicine | Admitting: Internal Medicine

## 2023-02-16 VITALS — BP 128/90 | HR 82 | Ht 63.0 in | Wt 228.0 lb

## 2023-02-16 DIAGNOSIS — R0602 Shortness of breath: Secondary | ICD-10-CM | POA: Diagnosis not present

## 2023-02-16 NOTE — Patient Instructions (Signed)
Medication Instructions:  No changes *If you need a refill on your cardiac medications before your next appointment, please call your pharmacy*  Follow-Up: At [facility], you and your health needs are our priority.  As part of our continuing mission to provide you with exceptional heart care, we have created designated Provider Care Teams.  These Care Teams include your primary Cardiologist (physician) and Advanced Practice Providers (APPs -  Physician Assistants and Nurse Practitioners) who all work together to provide you with the care you need, when you need it.  We recommend signing up for the patient portal called "MyChart".  Sign up information is provided on this After Visit Summary.  MyChart is used to connect with patients for Virtual Visits (Telemedicine).  Patients are able to view lab/test results, encounter notes, upcoming appointments, etc.  Non-urgent messages can be sent to your provider as well.   To learn more about what you can do with MyChart, go to [email or web address].    Your next appointment:    Follow up as needed  Provider:   Maisie Fus, MD

## 2023-02-17 ENCOUNTER — Encounter: Payer: Self-pay | Admitting: Hematology & Oncology

## 2023-02-24 NOTE — Patient Instructions (Signed)
DUE TO COVID-19 ONLY TWO VISITORS  (aged 51 and older)  ARE ALLOWED TO COME WITH YOU AND STAY IN THE WAITING ROOM ONLY DURING PRE OP AND PROCEDURE.   **NO VISITORS ARE ALLOWED IN THE SHORT STAY AREA OR RECOVERY ROOM!!**  IF YOU WILL BE ADMITTED INTO THE HOSPITAL YOU ARE ALLOWED ONLY FOUR SUPPORT PEOPLE DURING VISITATION HOURS ONLY (7 AM -8PM)   The support person(s) must pass our screening, gel in and out, and wear a mask at all times, including in the patient's room. Patients must also wear a mask when staff or their support person are in the room. Visitors GUEST BADGE MUST BE WORN VISIBLY  One adult visitor may remain with you overnight and MUST be in the room by 8 P.M.     Your procedure is scheduled on: 03/04/23   Report to [facility] Main Entrance    Report to admitting at : 7:15 AM   Call this number if you have problems the morning of surgery [phone]   Do not eat food :After Midnight.   After Midnight you may have the following liquids until : 6:30 AM DAY OF SURGERY  Water Black Coffee (sugar ok, NO MILK/CREAM OR CREAMERS)  Tea (sugar ok, NO MILK/CREAM OR CREAMERS) regular and decaf                             Plain Jell-O (NO RED)                                           Fruit ices (not with fruit pulp, NO RED)                                     Popsicles (NO RED)                                                                  Juice: apple, WHITE grape, WHITE cranberry Sports drinks like Gatorade (NO RED)              FOLLOW ANY ADDITIONAL PRE OP INSTRUCTIONS YOU RECEIVED FROM YOUR SURGEON'S OFFICE!!!   Oral Hygiene is also important to reduce your risk of infection.                                    Remember - BRUSH YOUR TEETH THE MORNING OF SURGERY WITH YOUR REGULAR TOOTHPASTE  DENTURES WILL BE REMOVED PRIOR TO SURGERY PLEASE DO NOT APPLY "Poly grip" OR ADHESIVES!!!   Do NOT smoke after Midnight   Take these medicines the morning of surgery with A  SIP OF WATER: carvedilol,loratadine,anastrozole,levothyroxine,famotidine.                   You may not have any metal on your body including hair pins, jewelry, and body piercing             Do not wear make-up, lotions, powders, perfumes/cologne, or deodorant  Do not wear nail polish including gel and S&S, artificial/acrylic nails, or any other type of covering on natural nails including finger and toenails. If you have artificial nails, gel coating, etc. that needs to be removed by a nail salon please have this removed prior to surgery or surgery may need to be canceled/ delayed if the surgeon/ anesthesia feels like they are unable to be safely monitored.   Do not shave  48 hours prior to surgery.    Do not bring valuables to the hospital. [facility] IS NOT             RESPONSIBLE   FOR VALUABLES.   Contacts, glasses, or bridgework may not be worn into surgery.   Bring small overnight bag day of surgery.   DO NOT BRING YOUR HOME MEDICATIONS TO THE HOSPITAL. PHARMACY WILL DISPENSE MEDICATIONS LISTED ON YOUR MEDICATION LIST TO YOU DURING YOUR ADMISSION IN THE HOSPITAL!    Patients discharged on the day of surgery will not be allowed to drive home.  Someone NEEDS to stay with you for the first 24 hours after anesthesia.   Special Instructions: Bring a copy of your healthcare power of attorney and living will documents         the day of surgery if you haven't scanned them before.              Please read over the following fact sheets you were given: IF YOU HAVE QUESTIONS ABOUT YOUR PRE-OP INSTRUCTIONS PLEASE CALL [phone]    [facility] - Preparing for Surgery Before surgery, you can play an important role.  Because skin is not sterile, your skin needs to be as free of germs as possible.  You can reduce the number of germs on your skin by washing with CHG (chlorahexidine gluconate) soap before surgery.  CHG is an antiseptic cleaner which kills germs and bonds with the skin to  continue killing germs even after washing. Please DO NOT use if you have an allergy to CHG or antibacterial soaps.  If your skin becomes reddened/irritated stop using the CHG and inform your nurse when you arrive at Short Stay. Do not shave (including legs and underarms) for at least 48 hours prior to the first CHG shower.  You may shave your face/neck. Please follow these instructions carefully:  1.  Shower with CHG Soap the night before surgery and the  morning of Surgery.  2.  If you choose to wash your hair, wash your hair first as usual with your  normal  shampoo.  3.  After you shampoo, rinse your hair and body thoroughly to remove the  shampoo.                           4.  Use CHG as you would any other liquid soap.  You can apply chg directly  to the skin and wash                       Gently with a scrungie or clean washcloth.  5.  Apply the CHG Soap to your body ONLY FROM THE NECK DOWN.   Do not use on face/ open                           Wound or open sores. Avoid contact with eyes, ears mouth and genitals (private parts).  Wash face,  Genitals (private parts) with your normal soap.             6.  Wash thoroughly, paying special attention to the area where your surgery  will be performed.  7.  Thoroughly rinse your body with warm water from the neck down.  8.  DO NOT shower/wash with your normal soap after using and rinsing off  the CHG Soap.                9.  Pat yourself dry with a clean towel.            10.  Wear clean pajamas.            11.  Place clean sheets on your bed the night of your first shower and do not  sleep with pets. Day of Surgery : Do not apply any lotions/deodorants the morning of surgery.  Please wear clean clothes to the hospital/surgery center.  FAILURE TO FOLLOW THESE INSTRUCTIONS MAY RESULT IN THE CANCELLATION OF YOUR SURGERY PATIENT SIGNATURE_________________________________  NURSE  SIGNATURE__________________________________  ________________________________________________________________________

## 2023-02-25 ENCOUNTER — Encounter (HOSPITAL_COMMUNITY): Payer: Self-pay | Admitting: *Deleted

## 2023-02-25 ENCOUNTER — Other Ambulatory Visit: Payer: Self-pay

## 2023-02-25 ENCOUNTER — Encounter (HOSPITAL_COMMUNITY)
Admission: RE | Admit: 2023-02-25 | Discharge: 2023-02-25 | Disposition: A | Discharge status: Home / Self Care | Payer: BC Managed Care – PPO | Source: Ambulatory Visit | Attending: General Surgery | Admitting: General Surgery

## 2023-02-25 VITALS — BP 135/84 | HR 89 | Temp 98.2°F | Ht 63.0 in | Wt 224.0 lb

## 2023-02-25 DIAGNOSIS — Z01812 Encounter for preprocedural laboratory examination: Secondary | ICD-10-CM | POA: Diagnosis present

## 2023-02-25 DIAGNOSIS — Z01818 Encounter for other preprocedural examination: Secondary | ICD-10-CM

## 2023-02-25 NOTE — Progress Notes (Signed)
  Bowel prep reminder:  PCP - Dois Davenport, MD  Cardiologist - Maisie Fus, MD   Chest x-ray - 07/28/22 EKG - 07/01/22 Stress Test -  ECHO - 01/12/23 Cardiac Cath -  Pacemaker/ICD device last checked:  Sleep Study - Yes CPAP - Yes  Fasting Blood Sugar - N/A Checks Blood Sugar ___0__ times a day  Blood Thinner Instructions: N/A Aspirin Instructions: Last Dose:  Anesthesia review: Hx: HTN,Pre-DIA,OSA(CPAP),SOB(resolved).  Patient denies shortness of breath, fever, cough and chest pain at PAT appointment   Patient verbalized understanding of instructions that were given to them at the PAT appointment. Patient was also instructed that they will need to review over the PAT instructions again at home before surgery.

## 2023-02-26 ENCOUNTER — Other Ambulatory Visit (HOSPITAL_COMMUNITY): Payer: BC Managed Care – PPO

## 2023-03-04 ENCOUNTER — Ambulatory Visit (HOSPITAL_COMMUNITY): Payer: BC Managed Care – PPO

## 2023-03-04 ENCOUNTER — Encounter (HOSPITAL_COMMUNITY)
Admission: RE | Disposition: A | Discharge status: Home / Self Care | Payer: Self-pay | Source: Ambulatory Visit | Attending: General Surgery

## 2023-03-04 ENCOUNTER — Ambulatory Visit (HOSPITAL_COMMUNITY): Payer: Self-pay

## 2023-03-04 ENCOUNTER — Other Ambulatory Visit: Payer: Self-pay

## 2023-03-04 ENCOUNTER — Encounter (HOSPITAL_COMMUNITY): Payer: Self-pay | Admitting: General Surgery

## 2023-03-04 ENCOUNTER — Ambulatory Visit (HOSPITAL_COMMUNITY)
Admission: RE | Admit: 2023-03-04 | Discharge: 2023-03-04 | Disposition: A | Discharge status: Home / Self Care | Payer: BC Managed Care – PPO | Source: Ambulatory Visit | Attending: General Surgery | Admitting: General Surgery

## 2023-03-04 DIAGNOSIS — Z853 Personal history of malignant neoplasm of breast: Secondary | ICD-10-CM | POA: Diagnosis not present

## 2023-03-04 DIAGNOSIS — Z9889 Other specified postprocedural states: Secondary | ICD-10-CM | POA: Diagnosis not present

## 2023-03-04 DIAGNOSIS — E119 Type 2 diabetes mellitus without complications: Secondary | ICD-10-CM | POA: Insufficient documentation

## 2023-03-04 DIAGNOSIS — Z86711 Personal history of pulmonary embolism: Secondary | ICD-10-CM | POA: Diagnosis not present

## 2023-03-04 DIAGNOSIS — Z9221 Personal history of antineoplastic chemotherapy: Secondary | ICD-10-CM | POA: Diagnosis not present

## 2023-03-04 DIAGNOSIS — Z452 Encounter for adjustment and management of vascular access device: Secondary | ICD-10-CM | POA: Diagnosis present

## 2023-03-04 DIAGNOSIS — G4733 Obstructive sleep apnea (adult) (pediatric): Secondary | ICD-10-CM | POA: Insufficient documentation

## 2023-03-04 DIAGNOSIS — K219 Gastro-esophageal reflux disease without esophagitis: Secondary | ICD-10-CM | POA: Insufficient documentation

## 2023-03-04 DIAGNOSIS — E039 Hypothyroidism, unspecified: Secondary | ICD-10-CM | POA: Diagnosis not present

## 2023-03-04 DIAGNOSIS — Z923 Personal history of irradiation: Secondary | ICD-10-CM | POA: Insufficient documentation

## 2023-03-04 DIAGNOSIS — I10 Essential (primary) hypertension: Secondary | ICD-10-CM | POA: Insufficient documentation

## 2023-03-04 DIAGNOSIS — Z01818 Encounter for other preprocedural examination: Secondary | ICD-10-CM

## 2023-03-04 HISTORY — PX: PORT-A-CATH REMOVAL: SHX5289

## 2023-03-04 LAB — GLUCOSE, CAPILLARY: Glucose-Capillary: 90 mg/dL (ref 70–99)

## 2023-03-04 SURGERY — REMOVAL PORT-A-CATH
Anesthesia: Monitor Anesthesia Care | Site: Chest

## 2023-03-04 MED ORDER — CIPROFLOXACIN IN D5W 400 MG/200ML IV SOLN
400.0000 mg | INTRAVENOUS | Status: AC
  Administered 2023-03-04: 400 mg via INTRAVENOUS
  Filled 2023-03-04: qty 200

## 2023-03-04 MED ORDER — FENTANYL CITRATE PF 50 MCG/ML IJ SOSY: 25.0000 ug | PREFILLED_SYRINGE | INTRAMUSCULAR | Status: DC | PRN

## 2023-03-04 MED ORDER — CHLORHEXIDINE GLUCONATE 0.12 % MT SOLN
15.0000 mL | Freq: Once | OROMUCOSAL | Status: AC
  Administered 2023-03-04: 15 mL via OROMUCOSAL

## 2023-03-04 MED ORDER — BUPIVACAINE-EPINEPHRINE 0.25% -1:200000 IJ SOLN
INTRAMUSCULAR | Status: AC
  Filled 2023-03-04: qty 1

## 2023-03-04 MED ORDER — 0.9 % SODIUM CHLORIDE (POUR BTL) OPTIME
TOPICAL | Status: DC | PRN
Start: 2023-03-04 — End: 2023-03-04
  Administered 2023-03-04: 1000 mL

## 2023-03-04 MED ORDER — OXYCODONE HCL 5 MG/5ML PO SOLN: 5.0000 mg | Freq: Once | ORAL | Status: DC | PRN

## 2023-03-04 MED ORDER — PHENYLEPHRINE HCL (PRESSORS) 10 MG/ML IV SOLN
INTRAVENOUS | Status: DC | PRN
  Administered 2023-03-04: 120 ug via INTRAVENOUS
  Administered 2023-03-04: 80 ug via INTRAVENOUS
  Administered 2023-03-04 (×2): 120 ug via INTRAVENOUS

## 2023-03-04 MED ORDER — PHENYLEPHRINE 80 MCG/ML (10ML) SYRINGE FOR IV PUSH (FOR BLOOD PRESSURE SUPPORT)
PREFILLED_SYRINGE | INTRAVENOUS | Status: AC
  Filled 2023-03-04: qty 10

## 2023-03-04 MED ORDER — CHLORHEXIDINE GLUCONATE CLOTH 2 % EX PADS: 6.0000 | MEDICATED_PAD | Freq: Once | CUTANEOUS | Status: DC

## 2023-03-04 MED ORDER — FENTANYL CITRATE (PF) 100 MCG/2ML IJ SOLN
INTRAMUSCULAR | Status: AC
  Filled 2023-03-04: qty 2

## 2023-03-04 MED ORDER — ACETAMINOPHEN 500 MG PO TABS
1000.0000 mg | ORAL_TABLET | ORAL | Status: AC
  Administered 2023-03-04: 1000 mg via ORAL
  Filled 2023-03-04: qty 2

## 2023-03-04 MED ORDER — PROPOFOL 1000 MG/100ML IV EMUL
INTRAVENOUS | Status: AC
  Filled 2023-03-04: qty 100

## 2023-03-04 MED ORDER — LIDOCAINE HCL (PF) 1 % IJ SOLN
INTRAMUSCULAR | Status: AC
  Filled 2023-03-04: qty 30

## 2023-03-04 MED ORDER — LACTATED RINGERS IV SOLN: INTRAVENOUS | Status: DC

## 2023-03-04 MED ORDER — FENTANYL CITRATE (PF) 100 MCG/2ML IJ SOLN
INTRAMUSCULAR | Status: DC | PRN
  Administered 2023-03-04 (×2): 50 ug via INTRAVENOUS

## 2023-03-04 MED ORDER — LIDOCAINE HCL 1 % IJ SOLN
INTRAMUSCULAR | Status: DC | PRN
  Administered 2023-03-04: 25 mL

## 2023-03-04 MED ORDER — MIDAZOLAM HCL 2 MG/2ML IJ SOLN
INTRAMUSCULAR | Status: AC
  Filled 2023-03-04: qty 2

## 2023-03-04 MED ORDER — MIDAZOLAM HCL 5 MG/5ML IJ SOLN
INTRAMUSCULAR | Status: DC | PRN
  Administered 2023-03-04: 2 mg via INTRAVENOUS

## 2023-03-04 MED ORDER — PROPOFOL 500 MG/50ML IV EMUL
INTRAVENOUS | Status: DC | PRN
  Administered 2023-03-04: 100 ug/kg/min via INTRAVENOUS

## 2023-03-04 MED ORDER — OXYCODONE HCL 5 MG PO TABS: 5.0000 mg | ORAL_TABLET | Freq: Once | ORAL | Status: DC | PRN

## 2023-03-04 MED ORDER — PROPOFOL 10 MG/ML IV BOLUS
INTRAVENOUS | Status: DC | PRN
  Administered 2023-03-04: 30 mg via INTRAVENOUS

## 2023-03-04 MED ORDER — ORAL CARE MOUTH RINSE: 15.0000 mL | Freq: Once | OROMUCOSAL | Status: AC

## 2023-03-04 MED ORDER — LIDOCAINE HCL (CARDIAC) PF 100 MG/5ML IV SOSY
PREFILLED_SYRINGE | INTRAVENOUS | Status: DC | PRN
  Administered 2023-03-04: 30 mg via INTRAVENOUS

## 2023-03-04 SURGICAL SUPPLY — 38 items
ADH SKN CLS APL DERMABOND .7 (GAUZE/BANDAGES/DRESSINGS) ×1
APL PRP STRL LF DISP 70% ISPRP (MISCELLANEOUS) ×1
APL SKNCLS STERI-STRIP NONHPOA (GAUZE/BANDAGES/DRESSINGS) ×1
BAG COUNTER SPONGE SURGICOUNT (BAG) IMPLANT
BAG SPNG CNTER NS LX DISP (BAG)
BENZOIN TINCTURE PRP APPL 2/3 (GAUZE/BANDAGES/DRESSINGS) ×1 IMPLANT
BLADE SURG 15 STRL LF DISP TIS (BLADE) ×1 IMPLANT
BLADE SURG 15 STRL SS (BLADE) ×1
CHLORAPREP W/TINT 26 (MISCELLANEOUS) ×1 IMPLANT
COVER SURGICAL LIGHT HANDLE (MISCELLANEOUS) ×1 IMPLANT
DERMABOND ADVANCED .7 DNX12 (GAUZE/BANDAGES/DRESSINGS) IMPLANT
DRAPE LAPAROTOMY TRNSV 102X78 (DRAPES) IMPLANT
ELECT COATED BLADE 2.86 ST (ELECTRODE) IMPLANT
ELECT REM PT RETURN 15FT ADLT (MISCELLANEOUS) ×1 IMPLANT
GAUZE 4X4 16PLY ~~LOC~~+RFID DBL (SPONGE) IMPLANT
GAUZE SPONGE 4X4 12PLY STRL (GAUZE/BANDAGES/DRESSINGS) ×1 IMPLANT
GLOVE BIO SURGEON STRL SZ 6 (GLOVE) ×1 IMPLANT
GLOVE BIOGEL PI IND STRL 6.5 (GLOVE) ×1 IMPLANT
GLOVE BIOGEL PI IND STRL 7.0 (GLOVE) ×1 IMPLANT
GLOVE INDICATOR 6.5 STRL GRN (GLOVE) ×2 IMPLANT
GOWN STRL REUS W/ TWL XL LVL3 (GOWN DISPOSABLE) ×1 IMPLANT
GOWN STRL REUS W/TWL XL LVL3 (GOWN DISPOSABLE) ×1
KIT BASIN OR (CUSTOM PROCEDURE TRAY) ×1 IMPLANT
KIT TURNOVER KIT A (KITS) IMPLANT
MARKER SKIN DUAL TIP RULER LAB (MISCELLANEOUS) ×1 IMPLANT
NDL HYPO 22X1.5 SAFETY MO (MISCELLANEOUS) IMPLANT
NDL HYPO 25X1 1.5 SAFETY (NEEDLE) ×1 IMPLANT
NEEDLE HYPO 22X1.5 SAFETY MO (MISCELLANEOUS)
NEEDLE HYPO 25X1 1.5 SAFETY (NEEDLE) ×1
PACK BASIC VI WITH GOWN DISP (CUSTOM PROCEDURE TRAY) ×1 IMPLANT
PENCIL SMOKE EVACUATOR (MISCELLANEOUS) IMPLANT
SPIKE FLUID TRANSFER (MISCELLANEOUS) ×1 IMPLANT
STRIP CLOSURE SKIN 1/2X4 (GAUZE/BANDAGES/DRESSINGS) ×1 IMPLANT
SUT MNCRL AB 4-0 PS2 18 (SUTURE) ×1 IMPLANT
SUT VIC AB 3-0 SH 27 (SUTURE)
SUT VIC AB 3-0 SH 27X BRD (SUTURE) IMPLANT
SYR CONTROL 10ML LL (SYRINGE) ×1 IMPLANT
TOWEL OR 17X26 10 PK STRL BLUE (TOWEL DISPOSABLE) ×1 IMPLANT

## 2023-03-04 NOTE — Anesthesia Postprocedure Evaluation (Signed)
Anesthesia Post Note  Patient: Holly Leach  Procedure(s) Performed: REMOVAL PORT-A-CATH (Chest)     Patient location during evaluation: PACU Anesthesia Type: MAC Level of consciousness: awake and alert Pain management: pain level controlled Vital Signs Assessment: post-procedure vital signs reviewed and stable Respiratory status: spontaneous breathing, nonlabored ventilation and respiratory function stable Cardiovascular status: blood pressure returned to baseline Postop Assessment: no apparent nausea or vomiting Anesthetic complications: no   No notable events documented.  Last Vitals:  Vitals:   03/04/23 1000 03/04/23 1015  BP: (!) 107/49 110/65  Pulse: 72 68  Resp: 13 17  Temp:    SpO2: 95% 95%    Last Pain:  Vitals:   03/04/23 1000  TempSrc:   PainSc: 0-No pain                 Shanda Howells

## 2023-03-04 NOTE — Discharge Instructions (Signed)
[facility] Phone Number [phone]   POST OP INSTRUCTIONS  Always review your discharge instruction sheet given to you by the facility where your surgery was performed.  IF YOU HAVE DISABILITY OR FAMILY LEAVE FORMS, YOU MUST BRING THEM TO THE OFFICE FOR PROCESSING.  DO NOT GIVE THEM TO YOUR DOCTOR.  Take 2 tylenol (acetominophen) three times a day for 3 days.  If you still have pain, add ibuprofen with food in between if able to take this (if you have kidney issues or stomach issues, do not take ibuprofen).  If both of those are not enough, add the narcotic pain pill.  If you find you are needing a lot of this overnight after surgery, call the next morning for a refill.   Take your usually prescribed medications unless otherwise directed If you need a refill on your pain medication, please contact your pharmacy.  They will contact our office to request authorization.  Prescriptions will not be filled after 5pm or on week-ends. You should eat very light the first 24 hours after surgery, such as soup, crackers, pudding, etc.  Resume your normal diet the day after surgery It is common to experience some constipation if taking pain medication after surgery.  Increasing fluid intake and taking a stool softener will usually help or prevent this problem from occurring.  A mild laxative (Milk of Magnesia or Miralax) should be taken according to package directions if there are no bowel movements after 48 hours. You may shower in 48 hours.  The surgical glue will flake off in 2-3 weeks.   ACTIVITIES:  No strenuous activity or heavy lifting for 1 week.   You may drive when you no longer are taking prescription pain medication, you can comfortably wear a seatbelt, and you can safely maneuver your car and apply brakes. RETURN TO WORK:  __________as tolerated if no lifting x 1 week_______________ You should see your doctor in the office for a follow-up appointment approximately  three-four weeks after your surgery.    WHEN TO CALL YOUR DOCTOR: Fever over 101.0 Nausea and/or vomiting. Extreme swelling or bruising. Continued bleeding from incision. Increased pain, redness, or drainage from the incision.  The clinic staff is available to answer your questions during regular business hours.  Please don't hesitate to call and ask to speak to one of the nurses for clinical concerns.  If you have a medical emergency, go to the nearest emergency room or call 911.  A surgeon from [facility] is always on call at the hospital.  For further questions, please visit [email or web address]

## 2023-03-04 NOTE — H&P (Signed)
Holly Leach is an 52 y.o. female.   Chief Complaint: port in place HPI:  Pt is s/p breast conservation surgery, chemo, and XRT for left breast cancer.  She desires port removal.   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    Past Surgical History:  Procedure Laterality Date   BREAST BIOPSY Left 06/19/2022   [place] LT BREAST BX W LOC DEV 1ST LESION IMG BX SPEC US GUIDE 06/19/2022 [facility]   BREAST BIOPSY  07/07/2022   MM LT RADIOACTIVE SEED LOC MAMMO GUIDE 07/07/2022 [facility]   BREAST LUMPECTOMY WITH RADIOACTIVE SEED AND SENTINEL LYMPH NODE BIOPSY Left 07/08/2022   Procedure: LEFT BREAST LUMPECTOMY WITH RADIOACTIVE SEED AND SENTINEL LYMPH NODE BIOPSY;  Surgeon: Almond Lint, MD;  Location: [facility] OR;  Service: General;  Laterality: Left;   COLONOSCOPY     COLPOSCOPY  2006   FINE NEEDLE ASPIRATION Left 07/28/2022   Procedure: ASPIRATION OF LEFT BREAST SEROMA;  Surgeon: Almond Lint, MD;  Location: [facility] OR;  Service: General;  Laterality: Left;   HERNIA REPAIR     umbilical   PORTACATH PLACEMENT Left 07/28/2022   Procedure: INSERTION PORT-A-CATH WITH ULTRASOUND GUIDANCE;  Surgeon: Almond Lint, MD;  Location: [facility] OR;  Service: General;  Laterality: Left;    Family History  Problem Relation Age of Onset   Hypertension Mother    Stroke Mother    Hypertension Father    Heart disease Father    Irritable bowel syndrome Sister    Hypertension Brother    Hyperlipidemia Brother    Stomach cancer Maternal Grandfather    Diabetes Paternal  Grandmother    Irritable bowel syndrome Maternal Aunt    Breast cancer Other        Maternal Great Aunt   Colon cancer Neg Hx    Colon polyps Neg Hx    Esophageal cancer Neg Hx    Rectal cancer Neg Hx    Ovarian cancer Neg Hx    Endometrial cancer Neg Hx    Social History:  reports that she has never smoked. She has never used smokeless tobacco. She reports that she does not drink alcohol and does not use drugs.  Allergies:  Allergies  Allergen Reactions   Augmentin [Amoxicillin-Pot Clavulanate] Diarrhea    Yeast Infection    Medications Prior to Admission  Medication Sig Dispense Refill   anastrozole (ARIMIDEX) 1 MG tablet Take 1 tablet (1 mg total) by mouth daily. 30 tablet 12   carvedilol (COREG) 3.125 MG tablet Take 3.125 mg by mouth 2 (two) times daily.     famotidine (PEPCID) 40 MG tablet Take 1 tablet (40 mg total) by mouth daily. 30 tablet 0   Levothyroxine Sodium 25 MCG CAPS Take 25 mcg by mouth daily before breakfast.     lisinopril-hydrochlorothiazide (ZESTORETIC) 20-25 MG tablet Take 1 tablet by mouth 2 (two) times daily.     loratadine (CLARITIN) 10  MG tablet Take 10 mg by mouth daily.     megestrol (MEGACE) 20 MG tablet Take 2 tablets (40 mg total) by mouth daily. Take 1 tablet (20 mg total) by mouth daily for hot flashes. 60 tablet 5   Melatonin 10 MG TABS Take 10 mg by mouth at bedtime.     montelukast (SINGULAIR) 10 MG tablet Take 10 mg by mouth daily.     Vitamin D, Ergocalciferol, (DRISDOL) 1.25 MG (50000 UNIT) CAPS capsule Take 50,000 Units by mouth once a week.     ALPRAZolam (XANAX) 0.25 MG tablet Take 1 tablet (0.25 mg total) by mouth at bedtime as needed for anxiety. 30 tablet 0   Bacillus Coagulans-Inulin (PROBIOTIC-PREBIOTIC PO) Take 2.9 g by mouth daily.     fluticasone (FLONASE) 50 MCG/ACT nasal spray Place 2 sprays into both nostrils daily as needed for allergies.     lidocaine-prilocaine (EMLA) cream Apply a dime size to port-a-cath 1-2 hours prior to  access. Cover with [employer]. (Patient not taking: Reported on 02/17/2023) 30 g 2   prochlorperazine (COMPAZINE) 10 MG tablet Take 1 tablet (10 mg total) by mouth every 6 (six) hours as needed for nausea or vomiting. (Patient not taking: Reported on 02/16/2023) 30 tablet 1    No results found for this or any previous visit (from the past 48 hour(s)). No results found.  Review of Systems  All other systems reviewed and are negative.   Blood pressure 138/89, pulse 77, temperature 98.4 F (36.9 C), temperature source Oral, last menstrual period 06/02/2015, SpO2 96%. Physical Exam Vitals reviewed.  Constitutional:      General: She is not in acute distress.    Appearance: Normal appearance.  HENT:     Head: Normocephalic and atraumatic.     Right Ear: External ear normal.     Left Ear: External ear normal.     Nose: Nose normal.     Mouth/Throat:     Mouth: Mucous membranes are moist.  Eyes:     General:        Right eye: No discharge.        Left eye: No discharge.     Extraocular Movements: Extraocular movements intact.     Conjunctiva/sclera: Conjunctivae normal.     Pupils: Pupils are equal, round, and reactive to light.  Cardiovascular:     Rate and Rhythm: Normal rate and regular rhythm.     Pulses: Normal pulses.  Pulmonary:     Effort: Pulmonary effort is normal.     Comments: Port left chest wall Abdominal:     General: Abdomen is flat.     Palpations: Abdomen is soft.  Musculoskeletal:        General: No swelling or tenderness. Normal range of motion.     Cervical back: Neck supple. No rigidity.  Skin:    General: Skin is warm and dry.     Capillary Refill: Capillary refill takes 2 to 3 seconds.  Neurological:     General: No focal deficit present.     Mental Status: She is alert and oriented to person, place, and time.  Psychiatric:        Mood and Affect: Mood normal.        Behavior: Behavior normal.      Assessment/Plan Left breast cancer Port in  plac.e  Plan port removal Reviewed surgery, risks, recovery. Pt desires to proceed.   Almond Lint, MD 03/04/2023, 9:04 AM

## 2023-03-04 NOTE — Op Note (Signed)
  PRE-OPERATIVE DIAGNOSIS:  un-needed Port-A-Cath for left breast cancer  POST-OPERATIVE DIAGNOSIS:  Same   PROCEDURE:  Procedure(s):  REMOVAL PORT-A-CATH  SURGEON:  Surgeon(s):  Stark Klein, MD  ANESTHESIA:   MAC + local  EBL:   Minimal  SPECIMEN:  None  Complications : none known  Procedure:   Pt was  identified in the holding area and taken to the operating room where she was placed supine on the operating room table.  MAC anesthesia was induced.  The left upper chest was prepped and draped.  The prior incision was anesthetized with local anesthetic.  The incision was opened with a #15 blade.  The subcutaneous tissue was divided with the cautery.  The port was identified and the capsule opened.  The four 2-0 prolene sutures were removed.  The port was then removed and pressure held on the tract.  The catheter appeared intact without evidence of breakage, length was 25.5 cm.  The wound was inspected for hemostasis, which was achieved with cautery.  The wound was closed with 3-0 vicryl deep dermal interrupted sutures and 4-0 Monocryl running subcuticular suture.  The wound was cleaned, dried, and dressed with dermabond.  The patient was awakened from anesthesia and taken to the PACU in stable condition.  Needle, sponge, and instrument counts are correct.

## 2023-03-04 NOTE — Transfer of Care (Signed)
Immediate Anesthesia Transfer of Care Note  Patient: Holly Leach  Procedure(s) Performed: REMOVAL PORT-A-CATH (Chest)  Patient Location: PACU  Anesthesia Type:MAC  Level of Consciousness: awake, alert , and oriented  Airway & Oxygen Therapy: Patient Spontanous Breathing and Patient connected to face mask oxygen  Post-op Assessment: Report given to RN and Post -op Vital signs reviewed and stable  Post vital signs: Reviewed and stable  Last Vitals:  Vitals Value Taken Time  BP 105/60 03/04/23 0954  Temp    Pulse 71 03/04/23 0957  Resp 16 03/04/23 0957  SpO2 95 % 03/04/23 0957  Vitals shown include unfiled device data.  Last Pain:  Vitals:   03/04/23 0732  TempSrc:   PainSc: 0-No pain         Complications: No notable events documented.

## 2023-03-04 NOTE — Anesthesia Preprocedure Evaluation (Addendum)
Anesthesia Evaluation  Patient identified by MRN, date of birth, ID band Patient awake    Reviewed: Allergy & Precautions, NPO status , Patient's Chart, lab work & pertinent test results, reviewed documented beta blocker date and time   History of Anesthesia Complications Negative for: history of anesthetic complications  Airway Mallampati: II  TM Distance: >3 FB Neck ROM: Full    Dental no notable dental hx.    Pulmonary sleep apnea and Continuous Positive Airway Pressure Ventilation , PE (2006)   Pulmonary exam normal        Cardiovascular hypertension, Pt. on medications and Pt. on home beta blockers Normal cardiovascular exam     Neuro/Psych  Headaches    GI/Hepatic Neg liver ROS,GERD  Medicated,,  Endo/Other  diabetes, Type 2Hypothyroidism    Renal/GU negative Renal ROS     Musculoskeletal negative musculoskeletal ROS (+)    Abdominal   Peds  Hematology negative hematology ROS (+)   Anesthesia Other Findings Breast ca  Reproductive/Obstetrics                              Anesthesia Physical Anesthesia Plan  ASA: 2  Anesthesia Plan: MAC   Post-op Pain Management: Minimal or no pain anticipated   Induction:   PONV Risk Score and Plan: 2 and Treatment may vary due to age or medical condition, Propofol infusion, Ondansetron and Midazolam  Airway Management Planned: Natural Airway and Simple Face Mask  Additional Equipment: None  Intra-op Plan:   Post-operative Plan:   Informed Consent: I have reviewed the patients History and Physical, chart, labs and discussed the procedure including the risks, benefits and alternatives for the proposed anesthesia with the patient or authorized representative who has indicated his/her understanding and acceptance.     Dental advisory given  Plan Discussed with: CRNA  Anesthesia Plan Comments:         Anesthesia Quick  Evaluation

## 2023-03-05 ENCOUNTER — Encounter (HOSPITAL_COMMUNITY): Payer: Self-pay | Admitting: General Surgery

## 2023-03-11 ENCOUNTER — Encounter: Payer: Self-pay | Admitting: *Deleted

## 2023-03-19 ENCOUNTER — Other Ambulatory Visit: Payer: Self-pay | Admitting: Medical Genetics

## 2023-03-19 DIAGNOSIS — Z006 Encounter for examination for normal comparison and control in clinical research program: Secondary | ICD-10-CM

## 2023-03-30 ENCOUNTER — Encounter: Payer: Self-pay | Admitting: Hematology & Oncology

## 2023-03-30 ENCOUNTER — Inpatient Hospital Stay: Payer: BC Managed Care – PPO | Attending: Hematology & Oncology

## 2023-03-30 ENCOUNTER — Inpatient Hospital Stay: Payer: BC Managed Care – PPO

## 2023-03-30 ENCOUNTER — Other Ambulatory Visit: Payer: Self-pay

## 2023-03-30 ENCOUNTER — Inpatient Hospital Stay (HOSPITAL_BASED_OUTPATIENT_CLINIC_OR_DEPARTMENT_OTHER): Payer: BC Managed Care – PPO | Admitting: Hematology & Oncology

## 2023-03-30 VITALS — BP 97/71 | HR 79 | Temp 98.2°F | Resp 19 | Ht 63.0 in | Wt 229.0 lb

## 2023-03-30 DIAGNOSIS — C50912 Malignant neoplasm of unspecified site of left female breast: Secondary | ICD-10-CM

## 2023-03-30 DIAGNOSIS — C50412 Malignant neoplasm of upper-outer quadrant of left female breast: Secondary | ICD-10-CM | POA: Insufficient documentation

## 2023-03-30 DIAGNOSIS — Z7952 Long term (current) use of systemic steroids: Secondary | ICD-10-CM | POA: Diagnosis not present

## 2023-03-30 DIAGNOSIS — E7801 Familial hypercholesterolemia: Secondary | ICD-10-CM

## 2023-03-30 DIAGNOSIS — Z79811 Long term (current) use of aromatase inhibitors: Secondary | ICD-10-CM | POA: Insufficient documentation

## 2023-03-30 DIAGNOSIS — Z923 Personal history of irradiation: Secondary | ICD-10-CM | POA: Insufficient documentation

## 2023-03-30 DIAGNOSIS — Z170 Estrogen receptor positive status [ER+]: Secondary | ICD-10-CM | POA: Diagnosis not present

## 2023-03-30 DIAGNOSIS — Z79899 Other long term (current) drug therapy: Secondary | ICD-10-CM | POA: Diagnosis not present

## 2023-03-30 LAB — CMP (CANCER CENTER ONLY)
ALT: 45 U/L — ABNORMAL HIGH (ref 0–44)
AST: 27 U/L (ref 15–41)
Albumin: 4.7 g/dL (ref 3.5–5.0)
Alkaline Phosphatase: 78 U/L (ref 38–126)
Anion gap: 9 (ref 5–15)
BUN: 15 mg/dL (ref 6–20)
CO2: 28 mmol/L (ref 22–32)
Calcium: 9.9 mg/dL (ref 8.9–10.3)
Chloride: 102 mmol/L (ref 98–111)
Creatinine: 1.06 mg/dL — ABNORMAL HIGH (ref 0.44–1.00)
GFR, Estimated: >60 mL/min (ref >60)
Glucose, Bld: 118 mg/dL — ABNORMAL HIGH (ref 70–99)
Potassium: 4.3 mmol/L (ref 3.5–5.1)
Sodium: 139 mmol/L (ref 135–145)
Total Bilirubin: 0.5 mg/dL (ref 0.3–1.2)
Total Protein: 7.4 g/dL (ref 6.5–8.1)

## 2023-03-30 LAB — CBC WITH DIFFERENTIAL (CANCER CENTER ONLY)
Abs Immature Granulocytes: 0.04 10*3/uL (ref 0.00–0.07)
Basophils Absolute: 0 10*3/uL (ref 0.0–0.1)
Basophils Relative: 1 %
Eosinophils Absolute: 0.1 10*3/uL (ref 0.0–0.5)
Eosinophils Relative: 2 %
HCT: 33.8 % — ABNORMAL LOW (ref 36.0–46.0)
Hemoglobin: 11 g/dL — ABNORMAL LOW (ref 12.0–15.0)
Immature Granulocytes: 1 %
Lymphocytes Relative: 15 %
Lymphs Abs: 0.8 10*3/uL (ref 0.7–4.0)
MCH: 27.6 pg (ref 26.0–34.0)
MCHC: 32.5 g/dL (ref 30.0–36.0)
MCV: 84.9 fL (ref 80.0–100.0)
Monocytes Absolute: 0.3 10*3/uL (ref 0.1–1.0)
Monocytes Relative: 5 %
Neutro Abs: 4 10*3/uL (ref 1.7–7.7)
Neutrophils Relative %: 76 %
Platelet Count: 254 10*3/uL (ref 150–400)
RBC: 3.98 MIL/uL (ref 3.87–5.11)
RDW: 16 % — ABNORMAL HIGH (ref 11.5–15.5)
WBC Count: 5.2 10*3/uL (ref 4.0–10.5)
nRBC: 0 % (ref 0.0–0.2)

## 2023-03-30 LAB — VITAMIN D 25 HYDROXY (VIT D DEFICIENCY, FRACTURES): Vit D, 25-Hydroxy: 65.91 ng/mL (ref 30–100)

## 2023-03-30 NOTE — Progress Notes (Signed)
Hematology and Oncology Follow Up Visit  Holly Leach [id number] 08/08/1970 52 y.o. 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 daily-start on 12/30/2022 -DC on 02/09/2023 Arimidex 1 mg p.o. daily-start on 02/15/2023     Interim History:  Holly Leach is back for follow-up.  She looks great.  She feels good.  She has had no problems with the Arimidex.  I am happy about that for her.  She is still working.  She is also remodeling her kitchen.  She has been quite busy.  Thankfully, her husband is doing a lot of the work for the kitchen remodel.  I am happy for her that she is going to save a lot of money.  She has a appoint with her family doctor today.  We will go ahead and send off her lipid panel.  She has had no change in bowel or bladder habits.  She has had no cough or shortness of breath.  She has had no nausea or vomiting.  There has been no rashes.  She has had no leg swelling.  Overall, I would have to say that her performance status is probably ECOG 0.   Medications:  Current Outpatient Medications:    ALPRAZolam (XANAX) 0.25 MG tablet, Take 1 tablet (0.25 mg total) by mouth at bedtime as needed for anxiety., Disp: 30 tablet, Rfl: 0   amoxicillin (AMOXIL) 500 MG capsule, Take 500 mg by mouth every 8 (eight) hours., Disp: , Rfl:    anastrozole (ARIMIDEX) 1 MG tablet, Take 1 tablet (1 mg total) by mouth daily., Disp: 30 tablet, Rfl: 12   Bacillus Coagulans-Inulin (PROBIOTIC-PREBIOTIC PO), Take 2.9 g by mouth daily., Disp: , Rfl:    carvedilol (COREG) 3.125 MG tablet, Take 3.125 mg by mouth 2 (two) times daily., Disp: , Rfl:    estradiol (ESTRACE) 0.1 MG/GM vaginal cream, Place 1  Applicatorful vaginally daily., Disp: , Rfl:    famotidine (PEPCID) 40 MG tablet, Take 1 tablet (40 mg total) by mouth daily., Disp: 30 tablet, Rfl: 0   fluconazole (DIFLUCAN) 100 MG tablet, Take 100 mg by mouth daily., Disp: , Rfl:    fluticasone (FLONASE) 50 MCG/ACT nasal spray, Place 2 sprays into both nostrils daily as needed for allergies., Disp: , Rfl:    Levothyroxine Sodium 25 MCG CAPS, Take 25 mcg by mouth daily before breakfast., Disp: , Rfl:    lidocaine-prilocaine (EMLA) cream, Apply a dime size to port-a-cath 1-2 hours prior to access. Cover with [employer]., Disp: 30 g, Rfl: 2   lisinopril-hydrochlorothiazide (ZESTORETIC) 20-25 MG tablet, Take 1 tablet by mouth 2 (two) times daily., Disp: , Rfl:    loratadine (CLARITIN) 10 MG tablet, Take 10 mg by mouth daily., Disp: , Rfl:    megestrol (MEGACE) 20 MG tablet, Take 2 tablets (40 mg total) by mouth daily. Take 1 tablet (20 mg total) by mouth daily for hot flashes., Disp: 60 tablet, Rfl: 5   Melatonin 10 MG TABS, Take 10 mg by mouth at bedtime., Disp: , Rfl:    montelukast (SINGULAIR) 10 MG tablet, Take 10 mg by mouth daily., Disp: , Rfl:    predniSONE (DELTASONE) 10 MG tablet, Take 10 mg by mouth daily., Disp: , Rfl:    predniSONE (  DELTASONE) 20 MG tablet, Take 20 mg by mouth daily with breakfast., Disp: , Rfl:    prochlorperazine (COMPAZINE) 10 MG tablet, Take 1 tablet (10 mg total) by mouth every 6 (six) hours as needed for nausea or vomiting., Disp: 30 tablet, Rfl: 1   Vitamin D, Ergocalciferol, (DRISDOL) 1.25 MG (50000 UNIT) CAPS capsule, Take 50,000 Units by mouth once a week., Disp: , Rfl:   Allergies:  Allergies  Allergen Reactions   Augmentin [Amoxicillin-Pot Clavulanate] Diarrhea    Yeast Infection    Past Medical History, Surgical history, Social history, and Family History were reviewed and updated.  Review of Systems: Review of Systems  Constitutional: Negative.   HENT:  Negative.    Eyes: Negative.    Respiratory: Negative.    Cardiovascular: Negative.   Gastrointestinal: Negative.   Endocrine: Negative.   Genitourinary: Negative.    Musculoskeletal: Negative.   Skin: Negative.   Neurological: Negative.   Hematological: Negative.   Psychiatric/Behavioral: Negative.      Physical Exam:  height is 5\' 3"  (1.6 m) and weight is 229 lb (103.9 kg). Her oral temperature is 98.2 F (36.8 C). Her blood pressure is 97/71 and her pulse is 79. Her respiration is 19 and oxygen saturation is 99%.   Wt Readings from Last 3 Encounters:  03/30/23 229 lb (103.9 kg)  02/25/23 224 lb (101.6 kg)  02/16/23 228 lb (103.4 kg)    Physical Exam Vitals reviewed.  Constitutional:      Comments: Breast exam shows right breast no masses, edema or erythema.  There is no right axillary adenopathy.  Left breast shows the lumpectomy at about the 2 o'clock position.  She has some hyperpigmentation and some skin peeling on the left breast.  There is some erythema at the lumpectomy site.  There may be a little bit of swelling.  I see no exudates. She has no left axillary adenopathy.  HENT:     Head: Normocephalic and atraumatic.  Eyes:     Pupils: Pupils are equal, round, and reactive to light.  Cardiovascular:     Rate and Rhythm: Normal rate and regular rhythm.     Heart sounds: Normal heart sounds.  Pulmonary:     Effort: Pulmonary effort is normal.     Breath sounds: Normal breath sounds.  Abdominal:     General: Bowel sounds are normal.     Palpations: Abdomen is soft.  Musculoskeletal:        General: No tenderness or deformity. Normal range of motion.     Cervical back: Normal range of motion.  Lymphadenopathy:     Cervical: No cervical adenopathy.  Skin:    General: Skin is warm and dry.     Findings: No erythema or rash.  Neurological:     Mental Status: She is alert and oriented to person, place, and time.  Psychiatric:        Behavior: Behavior normal.        Thought Content: Thought  content normal.        Judgment: Judgment normal.     Lab Results  Component Value Date   WBC 5.2 03/30/2023   HGB 11.0 (L) 03/30/2023   HCT 33.8 (L) 03/30/2023   MCV 84.9 03/30/2023   PLT 254 03/30/2023     Chemistry      Component Value Date/Time   NA 137 02/09/2023 1036   K 4.5 02/09/2023 1036   CL 104 02/09/2023 1036   CO2 25 02/09/2023 1036  BUN 19 02/09/2023 1036   CREATININE 1.06 (H) 02/09/2023 1036      Component Value Date/Time   CALCIUM 10.5 (H) 02/09/2023 1036   ALKPHOS 66 02/09/2023 1036   AST 17 02/09/2023 1036   ALT 21 02/09/2023 1036   BILITOT 0.4 02/09/2023 1036       Impression and Plan: Ms. Osmanski is a very nice 52 year old perimenopausal white female.  She has a fairly high-grade stage IIa ductal carcinoma of the left breast.  This has a very high Oncotype score.  Has a very high proliferation index.  I think I will give her adjuvant chemotherapy with Taxotere/Cytoxan.  She is doing well on the Arimidex.  We will continue her on the Arimidex.  The we can now get her through the Holiday season.  We will plan to get her back in 3 months.   Josph Macho, MD 10/29/20249:43 AM

## 2023-03-31 ENCOUNTER — Encounter: Payer: Self-pay | Admitting: *Deleted

## 2023-04-01 ENCOUNTER — Other Ambulatory Visit: Payer: Self-pay

## 2023-04-02 ENCOUNTER — Encounter: Payer: Self-pay | Admitting: *Deleted

## 2023-04-06 ENCOUNTER — Other Ambulatory Visit: Payer: Self-pay

## 2023-04-27 ENCOUNTER — Other Ambulatory Visit: Payer: Self-pay

## 2023-04-27 ENCOUNTER — Encounter: Payer: Self-pay | Admitting: Rehabilitation

## 2023-04-27 ENCOUNTER — Ambulatory Visit: Payer: BC Managed Care – PPO | Attending: General Surgery | Admitting: Rehabilitation

## 2023-04-27 DIAGNOSIS — Z483 Aftercare following surgery for neoplasm: Secondary | ICD-10-CM | POA: Insufficient documentation

## 2023-04-27 DIAGNOSIS — C50412 Malignant neoplasm of upper-outer quadrant of left female breast: Secondary | ICD-10-CM | POA: Diagnosis present

## 2023-04-27 DIAGNOSIS — M25612 Stiffness of left shoulder, not elsewhere classified: Secondary | ICD-10-CM | POA: Diagnosis not present

## 2023-04-27 DIAGNOSIS — Z170 Estrogen receptor positive status [ER+]: Secondary | ICD-10-CM | POA: Insufficient documentation

## 2023-04-27 DIAGNOSIS — R293 Abnormal posture: Secondary | ICD-10-CM | POA: Diagnosis not present

## 2023-04-27 DIAGNOSIS — R600 Localized edema: Secondary | ICD-10-CM | POA: Insufficient documentation

## 2023-04-27 NOTE — Therapy (Signed)
OUTPATIENT PHYSICAL THERAPY  UPPER EXTREMITY ONCOLOGY EVALUATION  Patient Name: Holly Leach MRN: [id number] DOB:1970/11/27, 52 y.o., female Today's Date: 04/27/2023  END OF SESSION:  PT End of Session - 04/27/23 1724     Visit Number 1    Number of Visits 1    Date for PT Re-Evaluation 04/27/23    PT Start Time 1600    PT Stop Time 1650    PT Time Calculation (min) 50 min    Activity Tolerance Patient tolerated treatment well    Behavior During Therapy WFL for tasks assessed/performed             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   Past Surgical History:  Procedure Laterality Date   BREAST BIOPSY Left 06/19/2022   [place] LT BREAST BX W LOC DEV 1ST LESION IMG BX SPEC US GUIDE 06/19/2022 [facility]   BREAST BIOPSY  07/07/2022   MM LT RADIOACTIVE SEED LOC MAMMO GUIDE 07/07/2022 [facility]   BREAST LUMPECTOMY WITH RADIOACTIVE SEED AND SENTINEL LYMPH NODE BIOPSY Left 07/08/2022   Procedure: LEFT BREAST LUMPECTOMY WITH RADIOACTIVE SEED AND SENTINEL LYMPH NODE BIOPSY;  Surgeon: Almond Lint, MD;  Location: [facility];  Service: General;  Laterality: Left;   COLONOSCOPY     COLPOSCOPY  2006   FINE NEEDLE ASPIRATION Left 07/28/2022   Procedure: ASPIRATION OF LEFT BREAST SEROMA;  Surgeon: Almond Lint, MD;  Location: [facility] OR;  Service: General;  Laterality: Left;   HERNIA REPAIR     umbilical   PORT-A-CATH REMOVAL N/A 03/04/2023   Procedure: REMOVAL PORT-A-CATH;  Surgeon: Almond Lint, MD;  Location: [facility];  Service: General;   Laterality: N/A;  60   PORTACATH PLACEMENT Left 07/28/2022   Procedure: INSERTION PORT-A-CATH WITH ULTRASOUND GUIDANCE;  Surgeon: Almond Lint, MD;  Location: [facility] OR;  Service: General;  Laterality: Left;   Patient Active Problem List   Diagnosis Date Noted   Genetic testing 01/04/2023   Stage II breast cancer, left (HCC) 07/27/2022   Neck mass 11/03/2013   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/03/2013    PCP: Nadyne Coombes, MD  REFERRING PROVIDER: Dr. Almond Lint   REFERRING DIAG: s/p Left Breast Cancer  THERAPY DIAG:  Malignant neoplasm of upper-outer quadrant of left breast in female, estrogen receptor positive (HCC)  Localized edema  Aftercare following surgery for neoplasm  Stiffness of left shoulder, not elsewhere classified  Abnormal posture  ONSET DATE: November 02, 2022  Rationale for Evaluation and Treatment: Rehabilitation  SUBJECTIVE:  SUBJECTIVE STATEMENT:  They want you to check on the scar tissue.  Arm motion and everything is feeling okay.  Pt was supposed to go to [employer] in December but cancelled.  Needs to get back and get something new with a zipper.  It is not really bothering me but I think they want to make sure it is okay.    PERTINENT HISTORY:  Patient was diagnosed on 06/19/22 with left grade 2 breast cancer. It measures 1.4 x1.3 x 1.1 cm and is located in the upper outer quadrant. It is ER+, PR-, HER2-  with a Ki67 of 85%. Pt underwent a  L breast lumpectomy and SLNB on 07/08/22 with 0+/3 LN, followed by 4 cycles of chemo and then radiation.   Mammogram showing post-op changes.    PAIN:  Are you having pain? No  PRECAUTIONS: Left UE lymphedema risk, right ACL tear  WEIGHT BEARING RESTRICTIONS: No  FALLS:  Has patient fallen in last 6 months? 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     OBJECTIVE:  COGNITION: Overall cognitive status: Within functional limits for tasks assessed   PALPATION: One tight scar tissue band noted in seated with arm overhead near the medial incision but moving in a more superior/inferior direction.  Noted mostly in abduction.  Able to feel it also in supine and sidelying once it was discovered but not initially.   OBSERVATIONS / OTHER ASSESSMENTS: Lt breast seems a bit swollen lateral trunk but not full breast.    SENSATION: Light touch: Deficits    POSTURE: forward head, rounded shoulders  UPPER EXTREMITY AROM/PROM: Pt reports fine  LYMPHEDEMA ASSESSMENTS:  SURGERY TYPE/DATE: 07/08/22 Left Lumpectomy with SLNB NUMBER OF LYMPH NODES REMOVED: 0/3 CHEMOTHERAPY: YES 08/07/22 4 treatments every 3 weeks RADIATION:Yes HORMONE TREATMENT: Pending INFECTIONS: NO  L-DEX LYMPHEDEMA SCREENING: The patient was assessed using the L-Dex machine today to produce a lymphedema score.  L-DEX LYMPHEDEMA SCREENING Measurement Type: Unilateral L-DEX MEASUREMENT EXTREMITY: Upper Extremity POSITION : Standing DOMINANT SIDE: Right At Risk Side: Left BASELINE SCORE (UNILATERAL): -0.2 L-DEX SCORE (UNILATERAL): 1.3 VALUE CHANGE (UNILAT): 1.5  BREAST COMPLAINTS QUESTIONNAIRE Pain:  3 Heaviness: 5 Swollen feeling:7 Tense Skin: 7 Redness: 0 Bra Print: 0 Size of Pores: 0 Hard feeling:  6 Total:   28  /80 A Score over 9 indicates lymphedema issues in the breast   TODAY'S TREATMENT:                                                                                                                                          DATE:  04/27/23 Brief eval/re-eval  performed Performed scar tissue release in Rt sidelying with arm overhead with cocoa butter with education throughout.   Handout given on scar massage including skin stretch and then massage strokes.  Added sidelying abduction,and open book as new stretches and encouraged any other stretches from before.   Performed SOZO early for patient.   PATIENT EDUCATION:  Education details: per today's note Person educated: Patient Education method: Chief Technology Officer Education comprehension: verbalized understanding and returned demonstration  HOME EXERCISE PROGRAM: Access Code: [facility] URL: [email or web address] Date: 04/27/2023 Prepared by: Gwenevere Abbot  Exercises - Sidelying Open Book Thoracic Lumbar Rotation and Extension  - 1 x daily - 7 x weekly - 1-3 sets - 10 reps - 5-6 sec seconds hold - Sidelying Shoulder Abduction  - 1 x daily - 7 x weekly - 1-3 sets - 10 reps - 5-6 seconds hold -self scar massage in sidelying  ASSESSMENT:  CLINICAL IMPRESSION: Patient is a 52 y.o. female who was seen today for physical therapy evaluation and treatment for complaints of tight scar tissue at the axillary incision.  Overall this does not bother her, but Dr. Donell Beers wanted her to see if there was anything else she should be doing.  Pts only deficit is an almost diagonal medial to lateral direction scar tissue band at the medial side fo the axillary incision.  It is noted in seated with arm overhead the most.  Pt was educated on where this band is located and then self massage and self stretches.  Due to getting a new job pt would like to limit days off so she will do this independently and return if needed.   OBJECTIVE IMPAIRMENTS: decreased activity tolerance, decreased knowledge of condition, decreased ROM, decreased strength, impaired UE functional use, and postural dysfunction, pain   ACTIVITY LIMITATIONS: None  PARTICIPATION LIMITATIONS: None  PERSONAL FACTORS: 3+ comorbidities:  Left Breast Cancer s/p chemo and radiation  are also affecting patient's functional outcome.   REHAB POTENTIAL: Good  CLINICAL DECISION MAKING: Stable/uncomplicated  EVALUATION COMPLEXITY: Low  GOALS: Goals reviewed with patient? Yes  SHORT TERM GOALS=LONG TEM GOALS: Target date: 04/27/2023  Pt will be independent  with HEP to improve shoulder ROM and strength Baseline: Goal status: MET  2. Pt will be ind with self scar massage  Baseline:  Goal status: MET     PLAN:  PT FREQUENCY: 1 visit and continued SOZO screens  PT DURATION: 6 weeks  PLANNED INTERVENTIONS: Therapeutic exercises, Therapeutic activity, Patient/Family education, Self Care, Joint mobilization, scar mobilization, Manual therapy, and Re-evaluation  PLAN FOR NEXT SESSION: continue Vanessa Ralphs, PT 04/27/2023, 5:24 PM

## 2023-04-28 ENCOUNTER — Other Ambulatory Visit: Payer: Self-pay

## 2023-05-07 ENCOUNTER — Other Ambulatory Visit (HOSPITAL_COMMUNITY): Payer: BC Managed Care – PPO

## 2023-05-10 ENCOUNTER — Other Ambulatory Visit: Payer: Self-pay | Admitting: General Surgery

## 2023-05-10 ENCOUNTER — Ambulatory Visit: Payer: BC Managed Care – PPO

## 2023-05-10 DIAGNOSIS — N6321 Unspecified lump in the left breast, upper outer quadrant: Secondary | ICD-10-CM

## 2023-05-24 ENCOUNTER — Telehealth: Payer: Self-pay

## 2023-05-24 ENCOUNTER — Other Ambulatory Visit (HOSPITAL_COMMUNITY)
Admission: RE | Admit: 2023-05-24 | Discharge: 2023-05-24 | Disposition: A | Discharge status: Home / Self Care | Payer: BC Managed Care – PPO | Source: Ambulatory Visit | Attending: Medical Genetics | Admitting: Medical Genetics

## 2023-05-24 DIAGNOSIS — Z006 Encounter for examination for normal comparison and control in clinical research program: Secondary | ICD-10-CM | POA: Insufficient documentation

## 2023-05-24 NOTE — Telephone Encounter (Signed)
Received phone call from patient stating she wanted Dr. Myna Hidalgo to know she is having a total hysterectomy on 06/07/2023.  Dr. Myna Hidalgo aware and had no further orders received.

## 2023-05-27 ENCOUNTER — Encounter (HOSPITAL_BASED_OUTPATIENT_CLINIC_OR_DEPARTMENT_OTHER): Payer: Self-pay | Admitting: Obstetrics and Gynecology

## 2023-05-28 ENCOUNTER — Encounter (HOSPITAL_BASED_OUTPATIENT_CLINIC_OR_DEPARTMENT_OTHER): Payer: Self-pay | Admitting: Obstetrics and Gynecology

## 2023-05-28 NOTE — Progress Notes (Signed)
Spoke w/ via phone for pre-op interview--- pt Lab needs dos----   no      Lab results------ lab appt 06-03-2023 @ 1415 getting CBC/ BMP/ EKG COVID test -----patient states asymptomatic no test needed Arrive at -------  0830 on 06-07-2023 NPO after MN NO Solid Food.  Clear liquids from MN until--- 0730 Med rec completed Medications to take morning of surgery ----- coreg, levothyroxine Diabetic medication ----- n/a Patient instructed no nail polish to be worn day of surgery Patient instructed to bring photo id and insurance card day of surgery Patient aware to have Driver (ride ) / caregiver    for 24 hours after surgery - husband, Holly Leach Patient Special Instructions ----- will pick up bag w/ hibiclens and written instructions at lab appt.  Asked pt to call if any questions.  Pt has OSA but has not uses cpap since 11/ 2023 stated due to breast cancer diagnosis Pre-Op special Instructions ----- n/a Patient verbalized understanding of instructions that were given at this phone interview. Patient denies chest pain, sob, fever, cough at the interview.

## 2023-05-28 NOTE — Progress Notes (Signed)
Your procedure is scheduled on :  Monday,  06-07-2023   Report to [facility] [place] AT  _8:30__ AM.   Call this number if you have problems the morning of surgery  :[phone]. Any questions prior to surgery call pre-op nurse , Judene Logue :  [phone]   OUR ADDRESS IS [address].  WE ARE LOCATED IN [facility]  PLEASE BRING YOUR INSURANCE CARD AND PHOTO ID DAY OF SURGERY.                                     REMEMBER:  Do not eat any food after midnight night before surgery.  You may have clear liquid diet from midnight night before surgery until 7:30 AM.  NO clear liquids after 7:30 AM day of surgery.  This includes no water,  candy/  gum/  mints.   Please brush your teeth morning of surgery and rinse mouth out.   CLEAR LIQUID DIET Allowed      Water                                                                   Coffee and tea, regular and decaf  (NO cream or milk products of any type, may sweeten, no honey)                         Carbonated beverages,  diet                                    Sports drinks like Gatorade _____________________________________________________________________     TAKE ONLY THESE MEDICATIONS MORNING OF SURGERY: Carvedilol (coreg) Levothyroxine                                       DO NOT WEAR JEWERLY/  METAL/  PIERCINGS (INCLUDING NO PLASTIC PIERCINGS) DO NOT WEAR LOTIONS, POWDERS, PERFUMES OR NAIL POLISH ON YOUR FINGERNAILS. TOENAIL POLISH IS OK TO WEAR. DO NOT SHAVE FOR 48 HOURS PRIOR TO DAY OF SURGERY.  CONTACTS, GLASSES, OR DENTURES MAY NOT BE WORN TO SURGERY.  REMEMBER: NO SMOKING, VAPING ,  DRUGS OR ALCOHOL FOR 24 HOURS BEFORE YOUR SURGERY.                                    [facility] IS NOT RESPONSIBLE  FOR ANY BELONGINGS.                                                                    Marland Kitchen           [facility] - Preparing for Surgery Before surgery, you can play  an important  role.  Because skin is not sterile, your skin needs to be as free of germs as possible.  You can reduce the number of germs on your skin by washing with CHG (chlorahexidine gluconate) soap before surgery.  CHG is an antiseptic cleaner which kills germs and bonds with the skin to continue killing germs even after washing. Please DO NOT use if you have an allergy to CHG or antibacterial soaps.  If your skin becomes reddened/irritated stop using the CHG and inform your nurse when you arrive at Short Stay. Do not shave (including legs and underarms) for at least 48 hours prior to the first CHG shower.  You may shave your face/neck. Please follow these instructions carefully:  1.  Shower with CHG Soap the night before surgery and the  morning of Surgery.  2.  If you choose to wash your hair, wash your hair first as usual with your  normal  shampoo.  3.  After you shampoo, rinse your hair and body thoroughly to remove the  shampoo.                                        4.  Use CHG as you would any other liquid soap.  You can apply chg directly  to the skin and wash , chg soap provided, night before and morning of your surgery.  5.  Apply the CHG Soap to your body ONLY FROM THE NECK DOWN.   Do not use on face/ open                           Wound or open sores. Avoid contact with eyes, ears mouth and genitals (private parts).                       Wash face,  Genitals (private parts) with your normal soap.             6.  Wash thoroughly, paying special attention to the area where your surgery  will be performed.  7.  Thoroughly rinse your body with warm water from the neck down.  8.  DO NOT shower/wash with your normal soap after using and rinsing off  the CHG Soap.             9.  Pat yourself dry with a clean towel.            10.  Wear clean pajamas.            11.  Place clean sheets on your bed the night of your first shower and do not  sleep with pets. Day of Surgery : Do not apply any lotions/  powders the morning of surgery.  Please wear clean clothes to the hospital/surgery center.  IF YOU HAVE ANY SKIN IRRITATION OR PROBLEMS WITH THE SURGICAL SOAP, PLEASE GET A BAR OF GOLD DIAL SOAP AND SHOWER THE NIGHT BEFORE YOUR SURGERY AND THE MORNING OF YOUR SURGERY. PLEASE LET THE NURSE KNOW MORNING OF YOUR SURGERY IF YOU HAD ANY PROBLEMS WITH THE SURGICAL SOAP.   YOUR SURGEON MAY HAVE REQUESTED EXTENDED RECOVERY TIME AFTER YOUR SURGERY. IT COULD BE A  JUST A FEW HOURS  UP TO AN OVERNIGHT STAY.  YOUR SURGEON SHOULD HAVE DISCUSSED THIS WITH YOU PRIOR TO YOUR SURGERY. IN THE EVENT  YOU NEED TO STAY OVERNIGHT PLEASE REFER TO THE FOLLOWING GUIDELINES. YOU MAY HAVE UP TO 4 VISITORS  MAY VISIT IN THE EXTENDED RECOVERY ROOM UNTIL 800 PM ONLY.  ONE  VISITOR AGE 38 AND OVER MAY SPEND THE NIGHT AND MUST BE IN EXTENDED RECOVERY ROOM NO LATER THAN 800 PM . YOUR DISCHARGE TIME AFTER YOU SPEND THE NIGHT IS 900 AM THE MORNING AFTER YOUR SURGERY. YOU MAY PACK A SMALL OVERNIGHT BAG WITH TOILETRIES FOR YOUR OVERNIGHT STAY IF YOU WISH.  REGARDLESS OF IF YOU STAY OVER NIGHT OR ARE DISCHARGED THE SAME DAY YOU WILL BE REQUIRED TO HAVE A RESPONSIBLE ADULT (18 YRS OLD OR OLDER) STAY WITH YOU FOR AT LEAST THE FIRST 4 HOURS WHEN HOME.  YOUR PRESCRIPTION MEDICATIONS WILL BE PROVIDED DURING [facility] STAY.  ________________________________________________________________________

## 2023-06-02 NOTE — H&P (Signed)
 Holly Leach is an 53 y.o. female G3P1101 with estrogen receptor positive breast cancer and perimenopausal bleeding - on anastrazole for definitive management with DaVinci Robot Assisted Total Laparoscopic Hysterectomy and Bilateral salpingoopherectomy, possible cystoscopy.  D/W pt r/b/a of procedure, also process and expectations.  Pt has previously had a umbilical hernia repair w 11cm of mesh.  D/W pt requirements for discharge DOS, that is plan.    Pertinent Gynecological History: G2P1101 G1 26wk SVD, passed away, twins G2 06/1995 - SVD 6#15  + abn pap, colpo - no procedure,  last '21 WNL HR HPV neg No STD  H/o estrogen recptor + Br Ca Menstrual History:  Patient's last menstrual period was 06/02/2015 (within years).    Past Medical History:  Diagnosis Date   Complete tear of right ACL 2023   followed by dr gerome ([facility])   (05-28-2023  per pt no issue w/ pain or falling currently, thinks it is ok and healed)   History of acute pancreatitis 08/16/2022   History of chemotherapy 07/2022   left breast cancer  08-06-2022  to  10-15-2022   History of exposure to tuberculosis    05-28-2023  per pt as a child exposed to tuberculosis in the home , grandfather;   stated has not had a NOT had a positive TB test   History of external beam radiation therapy 10/2022   left breast cancer  11-18-2022  to 12-17-2022   History of pulmonary embolus (PE) 10/2004   admitted for PE secondary to [facility];  started on lovenox/ coumadin-- followed by dr timmy (hematology/ oncology)  negative work-up for disorder   History of pyelonephritis 07/2021   Hyperlipidemia    Hypertension    Hypothyroidism    followed by pcp   Malignant neoplasm of upper-outer quadrant of left breast in female, estrogen receptor positive (HCC) 07/2022   oncologist-- dr ennever/  surgeon--- dr aron;  Stage IIA, grade 3;  IDC & DCIS,  ER+/ PR-/ HER2-;   completed chemo 10-15-2022,  completed radiation 12-17-2022   OSA  on CPAP    05-28-2023  per pt last used cpap last 11/ 2023,  followed by pcp   Pre-diabetes    Wears glasses     Past Surgical History:  Procedure Laterality Date   BREAST BIOPSY Left 06/19/2022   US  LT BREAST BX W LOC DEV 1ST LESION IMG BX SPEC US  GUIDE 06/19/2022 [facility]   BREAST BIOPSY  07/07/2022   MM LT RADIOACTIVE SEED LOC MAMMO GUIDE 07/07/2022 [facility]   BREAST LUMPECTOMY WITH RADIOACTIVE SEED AND SENTINEL LYMPH NODE BIOPSY Left 07/08/2022   Procedure: LEFT BREAST LUMPECTOMY WITH RADIOACTIVE SEED AND SENTINEL LYMPH NODE BIOPSY;  Surgeon: Aron Shoulders, MD;  Location: [facility];  Service: General;  Laterality: Left;   COLONOSCOPY     FINE NEEDLE ASPIRATION Left 07/28/2022   Procedure: ASPIRATION OF LEFT BREAST SEROMA;  Surgeon: Aron Shoulders, MD;  Location: [facility];  Service: General;  Laterality: Left;   PORT-A-CATH REMOVAL N/A 03/04/2023   Procedure: REMOVAL PORT-A-CATH;  Surgeon: Aron Shoulders, MD;  Location: [facility];  Service: General;  Laterality: N/A;  60   PORTACATH PLACEMENT Left 07/28/2022   Procedure: INSERTION PORT-A-CATH WITH ULTRASOUND GUIDANCE;  Surgeon: Aron Shoulders, MD;  Location: [facility];  Service: General;  Laterality: Left;   UMBILICAL HERNIA REPAIR  07/09/2020   @[facility]   by Dr CHARLENA Blush;   Robot Assisted w/ Mesh    Family History  Problem Relation Age  of Onset   Hypertension Mother    Stroke Mother    Hypertension Father    Heart disease Father    Irritable bowel syndrome Sister    Hypertension Brother    Hyperlipidemia Brother    Stomach cancer Maternal Grandfather    Diabetes Paternal Grandmother    Irritable bowel syndrome Maternal Aunt    Breast cancer Other        Maternal Great Aunt   Colon cancer Neg Hx    Colon polyps Neg Hx    Esophageal cancer Neg Hx    Rectal cancer Neg Hx    Ovarian cancer Neg Hx    Endometrial cancer Neg Hx     Social History:  reports that she has never smoked. She has never used smokeless tobacco. She  reports that she does not drink alcohol and does not use drugs. Married, engineer, structural - [employer]  Allergies:  Allergies  Allergen Reactions   Augmentin [Amoxicillin-Pot Clavulanate] Diarrhea    Yeast Infection   Trulicity [Dulaglutide] Other (See Comments)    Caused acute pancreatitis   Tape Itching and Rash    Adhesive with band-aid's cause bad rash/ itching    Meds: Xanax , anastrazole, carvediolol, zyrtec, flexeril , Vit D, levothyroxine , lisinopril /hydrochlorothiazide . Megesterol, singulair , melatonin, fluticasone   Review of Systems  Constitutional: Negative.   Respiratory: Negative.    Gastrointestinal: Negative.   Genitourinary:  Positive for menstrual problem (perimenopausal bleeding).  Musculoskeletal: Negative.   Skin: Negative.   Neurological: Negative.   Psychiatric/Behavioral: Negative.      Height 5' 3 (1.6 m), weight 101.6 kg, last menstrual period 06/02/2015. Physical Exam Constitutional:      Appearance: Normal appearance. She is obese.  HENT:     Head: Normocephalic and atraumatic.  Cardiovascular:     Rate and Rhythm: Normal rate and regular rhythm.  Pulmonary:     Effort: Pulmonary effort is normal.     Breath sounds: Normal breath sounds.  Abdominal:     General: Bowel sounds are normal.     Palpations: Abdomen is soft.  Genitourinary:    General: Normal vulva.     Rectum: Normal.  Musculoskeletal:        General: Normal range of motion.     Cervical back: Normal range of motion and neck supple.  Skin:    General: Skin is warm and dry.  Neurological:     General: No focal deficit present.     Mental Status: She is oriented to person, place, and time.  Psychiatric:        Mood and Affect: Mood normal.        Behavior: Behavior normal.   US  nl uterus, R ovary not vis, L ov WNL  Assessment/Plan: 52yo G2P1101 for RA TLH/BSO with perimenopausal VB with estrogen receptor + breast Cancer on anastrazole D/w pt r/b/a, process and  expectations Plan for d/c day of surgery Prescriptions sent to pharmacy prior to surgery  Eldean Nanna Bovard-Stuckert 06/02/2023, 8:10 PM

## 2023-06-03 ENCOUNTER — Encounter: Payer: Self-pay | Admitting: Hematology & Oncology

## 2023-06-03 ENCOUNTER — Encounter (HOSPITAL_COMMUNITY)
Admission: RE | Admit: 2023-06-03 | Discharge: 2023-06-03 | Disposition: A | Discharge status: Home / Self Care | Payer: BC Managed Care – PPO | Source: Ambulatory Visit | Attending: Obstetrics and Gynecology | Admitting: Obstetrics and Gynecology

## 2023-06-03 DIAGNOSIS — Z01818 Encounter for other preprocedural examination: Secondary | ICD-10-CM | POA: Diagnosis not present

## 2023-06-03 DIAGNOSIS — Z01812 Encounter for preprocedural laboratory examination: Secondary | ICD-10-CM | POA: Diagnosis present

## 2023-06-03 DIAGNOSIS — Z01810 Encounter for preprocedural cardiovascular examination: Secondary | ICD-10-CM | POA: Diagnosis present

## 2023-06-03 LAB — CBC
HCT: 36.3 % (ref 36.0–46.0)
Hemoglobin: 11.9 g/dL — ABNORMAL LOW (ref 12.0–15.0)
MCH: 28.1 pg (ref 26.0–34.0)
MCHC: 32.8 g/dL (ref 30.0–36.0)
MCV: 85.8 fL (ref 80.0–100.0)
Platelets: 275 10*3/uL (ref 150–400)
RBC: 4.23 MIL/uL (ref 3.87–5.11)
RDW: 14.4 % (ref 11.5–15.5)
WBC: 6.2 10*3/uL (ref 4.0–10.5)
nRBC: 0 % (ref 0.0–0.2)

## 2023-06-03 LAB — COMPREHENSIVE METABOLIC PANEL
ALT: 25 U/L (ref 0–44)
AST: 20 U/L (ref 15–41)
Albumin: 4 g/dL (ref 3.5–5.0)
Alkaline Phosphatase: 78 U/L (ref 38–126)
Anion gap: 8 (ref 5–15)
BUN: 22 mg/dL — ABNORMAL HIGH (ref 6–20)
CO2: 21 mmol/L — ABNORMAL LOW (ref 22–32)
Calcium: 9.6 mg/dL (ref 8.9–10.3)
Chloride: 106 mmol/L (ref 98–111)
Creatinine, Ser: 0.97 mg/dL (ref 0.44–1.00)
GFR, Estimated: >60 mL/min (ref >60)
Glucose, Bld: 111 mg/dL — ABNORMAL HIGH (ref 70–99)
Potassium: 4.6 mmol/L (ref 3.5–5.1)
Sodium: 135 mmol/L (ref 135–145)
Total Bilirubin: 0.4 mg/dL (ref 0.0–1.2)
Total Protein: 7.7 g/dL (ref 6.5–8.1)

## 2023-06-07 ENCOUNTER — Other Ambulatory Visit: Payer: Self-pay

## 2023-06-07 ENCOUNTER — Observation Stay (HOSPITAL_BASED_OUTPATIENT_CLINIC_OR_DEPARTMENT_OTHER)
Admission: RE | Admit: 2023-06-07 | Discharge: 2023-06-07 | Disposition: A | Discharge status: Home / Self Care | Payer: BC Managed Care – PPO | Attending: Obstetrics and Gynecology | Admitting: Obstetrics and Gynecology

## 2023-06-07 ENCOUNTER — Ambulatory Visit (HOSPITAL_BASED_OUTPATIENT_CLINIC_OR_DEPARTMENT_OTHER): Payer: BC Managed Care – PPO | Admitting: Anesthesiology

## 2023-06-07 ENCOUNTER — Encounter (HOSPITAL_BASED_OUTPATIENT_CLINIC_OR_DEPARTMENT_OTHER): Payer: Self-pay | Admitting: Obstetrics and Gynecology

## 2023-06-07 ENCOUNTER — Encounter (HOSPITAL_BASED_OUTPATIENT_CLINIC_OR_DEPARTMENT_OTHER)
Admission: RE | Disposition: A | Discharge status: Home / Self Care | Payer: Self-pay | Source: Home / Self Care | Attending: Obstetrics and Gynecology

## 2023-06-07 DIAGNOSIS — Z170 Estrogen receptor positive status [ER+]: Secondary | ICD-10-CM | POA: Diagnosis not present

## 2023-06-07 DIAGNOSIS — N924 Excessive bleeding in the premenopausal period: Secondary | ICD-10-CM | POA: Diagnosis present

## 2023-06-07 DIAGNOSIS — Z9889 Other specified postprocedural states: Secondary | ICD-10-CM

## 2023-06-07 DIAGNOSIS — Z86711 Personal history of pulmonary embolism: Secondary | ICD-10-CM | POA: Diagnosis not present

## 2023-06-07 DIAGNOSIS — C50412 Malignant neoplasm of upper-outer quadrant of left female breast: Secondary | ICD-10-CM | POA: Diagnosis not present

## 2023-06-07 DIAGNOSIS — E039 Hypothyroidism, unspecified: Secondary | ICD-10-CM | POA: Diagnosis not present

## 2023-06-07 DIAGNOSIS — I10 Essential (primary) hypertension: Secondary | ICD-10-CM | POA: Diagnosis not present

## 2023-06-07 DIAGNOSIS — Z01818 Encounter for other preprocedural examination: Secondary | ICD-10-CM

## 2023-06-07 HISTORY — DX: Presence of spectacles and contact lenses: Z97.3

## 2023-06-07 HISTORY — PX: ROBOTIC ASSISTED TOTAL HYSTERECTOMY WITH BILATERAL SALPINGO OOPHERECTOMY: SHX6086

## 2023-06-07 HISTORY — PX: ROBOTIC ASSISTED LAPAROSCOPIC LYSIS OF ADHESION: SHX6080

## 2023-06-07 HISTORY — DX: Contact with and (suspected) exposure to tuberculosis: Z20.1

## 2023-06-07 LAB — BASIC METABOLIC PANEL
Anion gap: 9 (ref 5–15)
BUN: 26 mg/dL — ABNORMAL HIGH (ref 6–20)
CO2: 20 mmol/L — ABNORMAL LOW (ref 22–32)
Calcium: 9 mg/dL (ref 8.9–10.3)
Chloride: 100 mmol/L (ref 98–111)
Creatinine, Ser: 1.24 mg/dL — ABNORMAL HIGH (ref 0.44–1.00)
GFR, Estimated: 53 mL/min — ABNORMAL LOW (ref >60)
Glucose, Bld: 150 mg/dL — ABNORMAL HIGH (ref 70–99)
Potassium: 4.4 mmol/L (ref 3.5–5.1)
Sodium: 129 mmol/L — ABNORMAL LOW (ref 135–145)

## 2023-06-07 LAB — CBC
HCT: 33.2 % — ABNORMAL LOW (ref 36.0–46.0)
Hemoglobin: 11 g/dL — ABNORMAL LOW (ref 12.0–15.0)
MCH: 28 pg (ref 26.0–34.0)
MCHC: 33.1 g/dL (ref 30.0–36.0)
MCV: 84.5 fL (ref 80.0–100.0)
Platelets: 234 10*3/uL (ref 150–400)
RBC: 3.93 MIL/uL (ref 3.87–5.11)
RDW: 14 % (ref 11.5–15.5)
WBC: 9.2 10*3/uL (ref 4.0–10.5)
nRBC: 0 % (ref 0.0–0.2)

## 2023-06-07 LAB — GENECONNECT MOLECULAR SCREEN: Genetic Analysis Overall Interpretation: NEGATIVE

## 2023-06-07 LAB — TYPE AND SCREEN
ABO/RH(D): O POS
Antibody Screen: NEGATIVE

## 2023-06-07 LAB — ABO/RH: ABO/RH(D): O POS

## 2023-06-07 SURGERY — HYSTERECTOMY, TOTAL, ROBOT-ASSISTED, LAPAROSCOPIC, WITH BILATERAL SALPINGO-OOPHORECTOMY
Anesthesia: General | Site: Abdomen | Laterality: Bilateral

## 2023-06-07 MED ORDER — ONDANSETRON HCL 4 MG/2ML IJ SOLN: 4.0000 mg | Freq: Four times a day (QID) | INTRAMUSCULAR | Status: DC | PRN

## 2023-06-07 MED ORDER — LIDOCAINE 2% (20 MG/ML) 5 ML SYRINGE
INTRAMUSCULAR | Status: DC | PRN
  Administered 2023-06-07: 60 mg via INTRAVENOUS

## 2023-06-07 MED ORDER — LACTATED RINGERS IV SOLN: INTRAVENOUS | Status: DC

## 2023-06-07 MED ORDER — LIDOCAINE HCL (PF) 2 % IJ SOLN
INTRAMUSCULAR | Status: AC
  Filled 2023-06-07: qty 5

## 2023-06-07 MED ORDER — ONDANSETRON HCL 4 MG/2ML IJ SOLN
INTRAMUSCULAR | Status: AC
  Filled 2023-06-07: qty 2

## 2023-06-07 MED ORDER — ALUM & MAG HYDROXIDE-SIMETH 200-200-20 MG/5ML PO SUSP: 30.0000 mL | ORAL | Status: DC | PRN

## 2023-06-07 MED ORDER — ANASTROZOLE 1 MG PO TABS: 1.0000 mg | ORAL_TABLET | Freq: Every day | ORAL | Status: DC

## 2023-06-07 MED ORDER — ALBUMIN HUMAN 5 % IV SOLN: INTRAVENOUS | Status: DC | PRN

## 2023-06-07 MED ORDER — KETOROLAC TROMETHAMINE 30 MG/ML IJ SOLN
INTRAMUSCULAR | Status: DC | PRN
  Administered 2023-06-07: 30 mg via INTRAVENOUS

## 2023-06-07 MED ORDER — MIDAZOLAM HCL 5 MG/5ML IJ SOLN
INTRAMUSCULAR | Status: DC | PRN
  Administered 2023-06-07: 2 mg via INTRAVENOUS

## 2023-06-07 MED ORDER — FENTANYL CITRATE (PF) 100 MCG/2ML IJ SOLN
INTRAMUSCULAR | Status: DC | PRN
  Administered 2023-06-07: 100 ug via INTRAVENOUS

## 2023-06-07 MED ORDER — CLINDAMYCIN PHOSPHATE 900 MG/50ML IV SOLN
900.0000 mg | INTRAVENOUS | Status: AC
  Administered 2023-06-07: 900 mg via INTRAVENOUS

## 2023-06-07 MED ORDER — ROCURONIUM BROMIDE 10 MG/ML (PF) SYRINGE
PREFILLED_SYRINGE | INTRAVENOUS | Status: DC | PRN
  Administered 2023-06-07: 20 mg via INTRAVENOUS
  Administered 2023-06-07: 50 mg via INTRAVENOUS
  Administered 2023-06-07: 10 mg via INTRAVENOUS

## 2023-06-07 MED ORDER — LORATADINE 10 MG PO TABS
10.0000 mg | ORAL_TABLET | Freq: Every day | ORAL | Status: DC
Start: 2023-06-07 — End: 2023-06-08

## 2023-06-07 MED ORDER — ONDANSETRON HCL 4 MG/2ML IJ SOLN
4.0000 mg | Freq: Four times a day (QID) | INTRAMUSCULAR | Status: DC | PRN
Start: 2023-06-07 — End: 2023-06-08

## 2023-06-07 MED ORDER — OXYCODONE-ACETAMINOPHEN 5-325 MG PO TABS
1.0000 | ORAL_TABLET | ORAL | Status: DC | PRN
  Administered 2023-06-07: 2 via ORAL
  Administered 2023-06-07: 1 via ORAL

## 2023-06-07 MED ORDER — GENTAMICIN SULFATE 40 MG/ML IJ SOLN
5.0000 mg/kg | INTRAVENOUS | Status: AC
  Administered 2023-06-07: 360 mg via INTRAVENOUS
  Filled 2023-06-07: qty 9

## 2023-06-07 MED ORDER — MENTHOL 3 MG MT LOZG: 1.0000 | LOZENGE | OROMUCOSAL | Status: DC | PRN

## 2023-06-07 MED ORDER — GABAPENTIN 300 MG PO CAPS
300.0000 mg | ORAL_CAPSULE | ORAL | Status: AC
  Administered 2023-06-07: 300 mg via ORAL

## 2023-06-07 MED ORDER — PROPOFOL 10 MG/ML IV BOLUS
INTRAVENOUS | Status: DC | PRN
  Administered 2023-06-07: 150 mg via INTRAVENOUS

## 2023-06-07 MED ORDER — MEGESTROL ACETATE 20 MG PO TABS: 20.0000 mg | ORAL_TABLET | Freq: Every day | ORAL | Status: DC

## 2023-06-07 MED ORDER — PROPOFOL 10 MG/ML IV BOLUS
INTRAVENOUS | Status: AC
Start: 2023-06-07 — End: ?
  Filled 2023-06-07: qty 20

## 2023-06-07 MED ORDER — DEXAMETHASONE SODIUM PHOSPHATE 10 MG/ML IJ SOLN
INTRAMUSCULAR | Status: AC
  Filled 2023-06-07: qty 1

## 2023-06-07 MED ORDER — SUGAMMADEX SODIUM 200 MG/2ML IV SOLN
INTRAVENOUS | Status: DC | PRN
  Administered 2023-06-07: 400 mg via INTRAVENOUS

## 2023-06-07 MED ORDER — ONDANSETRON HCL 4 MG PO TABS: 4.0000 mg | ORAL_TABLET | Freq: Four times a day (QID) | ORAL | Status: DC | PRN

## 2023-06-07 MED ORDER — DEXMEDETOMIDINE HCL IN NACL 80 MCG/20ML IV SOLN
INTRAVENOUS | Status: DC | PRN
  Administered 2023-06-07 (×2): 8 ug via INTRAVENOUS

## 2023-06-07 MED ORDER — ACETAMINOPHEN 500 MG PO TABS
ORAL_TABLET | ORAL | Status: AC
  Filled 2023-06-07: qty 2

## 2023-06-07 MED ORDER — PHENYLEPHRINE 80 MCG/ML (10ML) SYRINGE FOR IV PUSH (FOR BLOOD PRESSURE SUPPORT)
PREFILLED_SYRINGE | INTRAVENOUS | Status: DC | PRN
  Administered 2023-06-07 (×3): 120 ug via INTRAVENOUS
  Administered 2023-06-07: 200 ug via INTRAVENOUS

## 2023-06-07 MED ORDER — EPHEDRINE SULFATE (PRESSORS) 50 MG/ML IJ SOLN
INTRAMUSCULAR | Status: DC | PRN
  Administered 2023-06-07 (×2): 10 mg via INTRAVENOUS

## 2023-06-07 MED ORDER — OXYCODONE HCL 5 MG PO TABS: 5.0000 mg | ORAL_TABLET | Freq: Once | ORAL | Status: DC | PRN

## 2023-06-07 MED ORDER — HYDROMORPHONE HCL 1 MG/ML IJ SOLN
0.2000 mg | INTRAMUSCULAR | Status: DC | PRN
Start: 2023-06-07 — End: 2023-06-08

## 2023-06-07 MED ORDER — GUAIFENESIN 100 MG/5ML PO LIQD: 15.0000 mL | ORAL | Status: DC | PRN

## 2023-06-07 MED ORDER — ONDANSETRON HCL 4 MG/2ML IJ SOLN
INTRAMUSCULAR | Status: DC | PRN
  Administered 2023-06-07: 4 mg via INTRAVENOUS

## 2023-06-07 MED ORDER — OXYCODONE-ACETAMINOPHEN 5-325 MG PO TABS
ORAL_TABLET | ORAL | Status: AC
  Filled 2023-06-07: qty 1

## 2023-06-07 MED ORDER — OXYCODONE HCL 5 MG/5ML PO SOLN: 5.0000 mg | Freq: Once | ORAL | Status: DC | PRN

## 2023-06-07 MED ORDER — POVIDONE-IODINE 10 % EX SWAB
2.0000 | Freq: Once | CUTANEOUS | Status: AC
  Administered 2023-06-07: 2 via TOPICAL

## 2023-06-07 MED ORDER — FENTANYL CITRATE (PF) 100 MCG/2ML IJ SOLN: 25.0000 ug | INTRAMUSCULAR | Status: DC | PRN

## 2023-06-07 MED ORDER — ACETAMINOPHEN 500 MG PO TABS
1000.0000 mg | ORAL_TABLET | ORAL | Status: AC
  Administered 2023-06-07: 1000 mg via ORAL

## 2023-06-07 MED ORDER — FENTANYL CITRATE (PF) 250 MCG/5ML IJ SOLN
INTRAMUSCULAR | Status: AC
  Filled 2023-06-07: qty 5

## 2023-06-07 MED ORDER — BUPIVACAINE HCL (PF) 0.25 % IJ SOLN
INTRAMUSCULAR | Status: DC | PRN
  Administered 2023-06-07: 14 mL

## 2023-06-07 MED ORDER — KETOROLAC TROMETHAMINE 30 MG/ML IJ SOLN
INTRAMUSCULAR | Status: AC
  Filled 2023-06-07: qty 1

## 2023-06-07 MED ORDER — ROCURONIUM BROMIDE 10 MG/ML (PF) SYRINGE
PREFILLED_SYRINGE | INTRAVENOUS | Status: AC
  Filled 2023-06-07: qty 10

## 2023-06-07 MED ORDER — OXYCODONE-ACETAMINOPHEN 5-325 MG PO TABS
ORAL_TABLET | ORAL | Status: AC
  Filled 2023-06-07: qty 2

## 2023-06-07 MED ORDER — FLUTICASONE PROPIONATE 50 MCG/ACT NA SUSP: 2.0000 | Freq: Every day | NASAL | Status: DC | PRN

## 2023-06-07 MED ORDER — IBUPROFEN 800 MG PO TABS: 800.0000 mg | ORAL_TABLET | Freq: Three times a day (TID) | ORAL | Status: DC | PRN

## 2023-06-07 MED ORDER — CARVEDILOL 3.125 MG PO TABS: 3.1250 mg | ORAL_TABLET | Freq: Two times a day (BID) | ORAL | Status: DC

## 2023-06-07 MED ORDER — ALPRAZOLAM 0.5 MG PO TABS: 0.2500 mg | ORAL_TABLET | Freq: Every evening | ORAL | Status: DC | PRN

## 2023-06-07 MED ORDER — EPHEDRINE 5 MG/ML INJ
INTRAVENOUS | Status: AC
  Filled 2023-06-07: qty 5

## 2023-06-07 MED ORDER — LEVOTHYROXINE SODIUM 25 MCG PO CAPS: 25.0000 ug | ORAL_CAPSULE | Freq: Every day | ORAL | Status: DC

## 2023-06-07 MED ORDER — LACTATED RINGERS IV BOLUS: 500.0000 mL | Freq: Once | INTRAVENOUS | Status: DC

## 2023-06-07 MED ORDER — SIMETHICONE 80 MG PO CHEW: 80.0000 mg | CHEWABLE_TABLET | Freq: Four times a day (QID) | ORAL | Status: DC | PRN

## 2023-06-07 MED ORDER — GABAPENTIN 300 MG PO CAPS
ORAL_CAPSULE | ORAL | Status: AC
  Filled 2023-06-07: qty 1

## 2023-06-07 MED ORDER — PHENYLEPHRINE 80 MCG/ML (10ML) SYRINGE FOR IV PUSH (FOR BLOOD PRESSURE SUPPORT)
PREFILLED_SYRINGE | INTRAVENOUS | Status: AC
  Filled 2023-06-07: qty 10

## 2023-06-07 MED ORDER — DEXAMETHASONE SODIUM PHOSPHATE 10 MG/ML IJ SOLN
INTRAMUSCULAR | Status: DC | PRN
  Administered 2023-06-07: 10 mg via INTRAVENOUS

## 2023-06-07 MED ORDER — MONTELUKAST SODIUM 10 MG PO TABS: 10.0000 mg | ORAL_TABLET | Freq: Every day | ORAL | Status: DC

## 2023-06-07 MED ORDER — LISINOPRIL-HYDROCHLOROTHIAZIDE 20-25 MG PO TABS: 1.0000 | ORAL_TABLET | Freq: Two times a day (BID) | ORAL | Status: DC

## 2023-06-07 MED ORDER — MIDAZOLAM HCL 2 MG/2ML IJ SOLN
INTRAMUSCULAR | Status: AC
  Filled 2023-06-07: qty 2

## 2023-06-07 MED ORDER — GABAPENTIN 300 MG PO CAPS: 300.0000 mg | ORAL_CAPSULE | Freq: Three times a day (TID) | ORAL | Status: DC | PRN

## 2023-06-07 MED ORDER — CLINDAMYCIN PHOSPHATE 900 MG/50ML IV SOLN
INTRAVENOUS | Status: AC
  Filled 2023-06-07: qty 50

## 2023-06-07 SURGICAL SUPPLY — 45 items
CANNULA CAP OBTURATR AIRSEAL 8 (CAP) ×3 IMPLANT
CATH FOLEY 3WAY 5CC 16FR (CATHETERS) ×3 IMPLANT
COVER BACK TABLE 60X90IN (DRAPES) ×3 IMPLANT
COVER TIP SHEARS 8 DVNC (MISCELLANEOUS) ×3 IMPLANT
DEFOGGER SCOPE WARMER CLEARIFY (MISCELLANEOUS) ×3 IMPLANT
DERMABOND ADVANCED .7 DNX12 (GAUZE/BANDAGES/DRESSINGS) ×3 IMPLANT
DRAPE ARM DVNC X/XI (DISPOSABLE) ×12 IMPLANT
DRAPE COLUMN DVNC XI (DISPOSABLE) ×3 IMPLANT
DRAPE SURG IRRIG POUCH 19X23 (DRAPES) ×3 IMPLANT
DRAPE UTILITY XL STRL (DRAPES) ×3 IMPLANT
DRIVER NDL MEGA SUTCUT DVNCXI (INSTRUMENTS) ×2 IMPLANT
DRIVER NDLE MEGA SUTCUT DVNCXI (INSTRUMENTS) ×3
DURAPREP 26ML APPLICATOR (WOUND CARE) ×3 IMPLANT
ELECT REM PT RETURN 9FT ADLT (ELECTROSURGICAL) ×3
ELECTRODE REM PT RTRN 9FT ADLT (ELECTROSURGICAL) ×2 IMPLANT
FORCEPS BPLR FENES DVNC XI (FORCEP) ×3 IMPLANT
FORCEPS PROGRASP DVNC XI (FORCEP) ×1 IMPLANT
GAUZE 4X4 16PLY ~~LOC~~+RFID DBL (SPONGE) ×3 IMPLANT
GLOVE BIO SURGEON STRL SZ 6.5 (GLOVE) ×9 IMPLANT
HEMOSTAT ARISTA ABSORB 3G PWDR (HEMOSTASIS) IMPLANT
HOLDER FOLEY CATH W/STRAP (MISCELLANEOUS) ×1 IMPLANT
IRRIG SUCT STRYKERFLOW 2 WTIP (MISCELLANEOUS) ×3
IRRIGATION SUCT STRKRFLW 2 WTP (MISCELLANEOUS) ×2 IMPLANT
KIT PINK PAD W/HEAD ARE REST (MISCELLANEOUS) ×3
KIT PINK PAD W/HEAD ARM REST (MISCELLANEOUS) ×2 IMPLANT
KIT TURNOVER CYSTO (KITS) ×3 IMPLANT
LEGGING LITHOTOMY PAIR STRL (DRAPES) ×3 IMPLANT
MANIFOLD NEPTUNE II (INSTRUMENTS) ×3 IMPLANT
MANIPULATOR ADVINCU DEL 3.0 PL (MISCELLANEOUS) ×1 IMPLANT
NDL INSUFFLATION 14GA 120MM (NEEDLE) ×2 IMPLANT
NEEDLE INSUFFLATION 14GA 120MM (NEEDLE) ×3
PACK ROBOT WH (CUSTOM PROCEDURE TRAY) ×3 IMPLANT
PACK ROBOTIC GOWN (GOWN DISPOSABLE) ×3 IMPLANT
PAD OB MATERNITY 4.3X12.25 (PERSONAL CARE ITEMS) ×3 IMPLANT
PAD PREP 24X48 CUFFED NSTRL (MISCELLANEOUS) ×3 IMPLANT
PROTECTOR NERVE ULNAR (MISCELLANEOUS) ×3 IMPLANT
SCISSORS MNPLR CVD DVNC XI (INSTRUMENTS) ×3 IMPLANT
SEAL UNIV 5-12 XI (MISCELLANEOUS) ×6 IMPLANT
SEALER VESSEL EXT DVNC XI (MISCELLANEOUS) ×1 IMPLANT
SET TUBE FILTERED XL AIRSEAL (SET/KITS/TRAYS/PACK) ×3 IMPLANT
SLEEVE SCD COMPRESS KNEE MED (STOCKING) ×3 IMPLANT
SUT VIC AB 4-0 PS2 18 (SUTURE) ×5 IMPLANT
SUT VLOC 180 0 9IN GS21 (SUTURE) ×3 IMPLANT
TOWEL OR 17X24 6PK STRL BLUE (TOWEL DISPOSABLE) ×3 IMPLANT
WATER STERILE IRR 500ML POUR (IV SOLUTION) ×1 IMPLANT

## 2023-06-07 NOTE — Progress Notes (Signed)
*   Day of Surgery * Procedure(s) (LRB): XI ROBOTIC ASSISTED LAPAROSCOPIC HYSTERECTOMY WITH BILATERAL SALPINGO-OOPHORECTOMY (Bilateral) XI ROBOTIC ASSISTED LAPAROSCOPIC LYSIS OF ADHESION  Subjective: Patient reports incisional pain, tolerating PO, and no problems voiding.  Going to ambulate  Objective: I have reviewed patient's vital signs, intake and output, medications, and labs.  General: alert and no distress Resp: clear to auscultation bilaterally Cardio: regular rate and rhythm GI: soft, non-tender; bowel sounds normal; no masses,  no organomegaly Extremities: extremities normal, atraumatic, no cyanosis or edema Inc C/D/I  Assessment: s/p Procedure(s): XI ROBOTIC ASSISTED LAPAROSCOPIC HYSTERECTOMY WITH BILATERAL SALPINGO-OOPHORECTOMY (Bilateral) XI ROBOTIC ASSISTED LAPAROSCOPIC LYSIS OF ADHESION: stable and progressing well  Plan: Advance diet Encourage ambulation Discharge home when ambulating.  Has voided, pain controlled, tol po Also check labs before d/c D/c with percocet, gabapentin  and motrin  F/u 2 and 6 weeks  LOS: 0 days    Ezzie Buba, MD 06/07/2023, 4:15 PM

## 2023-06-07 NOTE — Transfer of Care (Signed)
 Immediate Anesthesia Transfer of Care Note  Patient: Holly Leach  Procedure(s) Performed: XI ROBOTIC ASSISTED LAPAROSCOPIC HYSTERECTOMY WITH BILATERAL SALPINGO-OOPHORECTOMY (Bilateral) XI ROBOTIC ASSISTED LAPAROSCOPIC LYSIS OF ADHESION (Abdomen)  Patient Location: PACU  Anesthesia Type:General  Level of Consciousness: drowsy  Airway & Oxygen Therapy: Patient Spontanous Breathing and Patient connected to nasal cannula oxygen  Post-op Assessment: Report given to RN  Post vital signs: Reviewed and stable  Last Vitals:  Vitals Value Taken Time  BP 97/51   Temp    Pulse 81 06/07/23 1258  Resp 18 06/07/23 1258  SpO2 96 % 06/07/23 1258  Vitals shown include unfiled device data.  Last Pain:  Vitals:   06/07/23 0850  TempSrc: Oral  PainSc: 0-No pain      Patients Stated Pain Goal: 6 (06/07/23 0850)  Complications: No notable events documented.

## 2023-06-07 NOTE — Anesthesia Preprocedure Evaluation (Signed)
 Anesthesia Evaluation  Patient identified by MRN, date of birth, ID band Patient awake    Reviewed: Allergy & Precautions, H&P , NPO status , Patient's Chart, lab work & pertinent test results  Airway Mallampati: II   Neck ROM: full    Dental   Pulmonary sleep apnea , PE   breath sounds clear to auscultation       Cardiovascular hypertension,  Rhythm:regular Rate:Normal     Neuro/Psych    GI/Hepatic H/o pancreatitis 08/16/22   Endo/Other  Hypothyroidism    Renal/GU      Musculoskeletal   Abdominal   Peds  Hematology   Anesthesia Other Findings   Reproductive/Obstetrics                             Anesthesia Physical Anesthesia Plan  ASA: 2  Anesthesia Plan: General   Post-op Pain Management:    Induction: Intravenous  PONV Risk Score and Plan: 3 and Ondansetron , Dexamethasone , Midazolam  and Treatment may vary due to age or medical condition  Airway Management Planned: Oral ETT  Additional Equipment:   Intra-op Plan:   Post-operative Plan: Extubation in OR  Informed Consent: I have reviewed the patients History and Physical, chart, labs and discussed the procedure including the risks, benefits and alternatives for the proposed anesthesia with the patient or authorized representative who has indicated his/her understanding and acceptance.     Dental advisory given  Plan Discussed with: CRNA, Anesthesiologist and Surgeon  Anesthesia Plan Comments:        Anesthesia Quick Evaluation

## 2023-06-07 NOTE — Interval H&P Note (Signed)
 History and Physical Interval Note:  06/07/2023 9:40 AM  Holly Leach  has presented today for surgery, with the diagnosis of postmenopausal bleeding.  The various methods of treatment have been discussed with the patient and family. After consideration of risks, benefits and other options for treatment, the patient has consented to  Procedure(s): XI ROBOTIC ASSISTED LAPAROSCOPIC HYSTERECTOMY AND SALPINGO-OOPHORECTOMY (Bilateral) CYSTOSCOPY (N/A) as a surgical intervention.  The patient's history has been reviewed, patient examined, no change in status, stable for surgery.  I have reviewed the patient's chart and labs.  Questions were answered to the patient's satisfaction.     Dillon Mcreynolds Bovard-Stuckert

## 2023-06-07 NOTE — Anesthesia Procedure Notes (Addendum)
 Procedure Name: Intubation Date/Time: 06/07/2023 10:28 AM  Performed by: Alejo Delon DASEN, CRNAPre-anesthesia Checklist: Patient identified, Emergency Drugs available, Suction available and Patient being monitored Patient Re-evaluated:Patient Re-evaluated prior to induction Oxygen Delivery Method: Circle system utilized Preoxygenation: Pre-oxygenation with 100% oxygen Induction Type: IV induction Ventilation: Mask ventilation without difficulty Laryngoscope Size: Mac and 3 Grade View: Grade I Tube type: Oral Number of attempts: 2 Airway Equipment and Method: Stylet Placement Confirmation: ETT inserted through vocal cords under direct vision, positive ETCO2 and breath sounds checked- equal and bilateral Secured at: 19 cm Tube secured with: Tape Dental Injury: Teeth and Oropharynx as per pre-operative assessment

## 2023-06-07 NOTE — Op Note (Signed)
 NAME: Holly Leach, Holly Leach. MEDICAL RECORD NO: [id number] ACCOUNT NO: [id number] DATE OF BIRTH: Jun 04, 1970 FACILITY: [facility] LOCATION: [facility] PHYSICIAN: Ezzie Buba, MD  Operative Report   DATE OF PROCEDURE: 06/07/2023  PREOPERATIVE DIAGNOSES: 1.  Perimenopausal bleeding. 2.  Estrogen receptor positive breast cancer.  POSTOPERATIVE DIAGNOSES: 1.  Perimenopausal bleeding. 2.  Estrogen receptor positive breast cancer. 3.  Status post total laparoscopic hysterectomy with bilateral salpingo-oophorectomy.  PROCEDURE:  Da Vinci robot-assisted total laparoscopic hysterectomy with bilateral salpingo-oophorectomy as well as robot-assisted laparoscopic lysis of adhesion.  SURGEON:  Ezzie Buba, MD  ASSISTANT:  Powell Hoops, RNFA.  ANESTHESIA:  Local and general.  ESTIMATED BLOOD LOSS:  50 mL.  IV FLUID: Per anesthesia.  URINE OUTPUT:  50 mL.  Clear urine at the end of the procedure.  COMPLICATIONS:  Omental adhesions to mesh as well as intestinal adhesions to the anterior abdominal wall.  PATHOLOGY:  Uterus, cervix, bilateral fallopian tubes, and ovaries.  DESCRIPTION OF PROCEDURE:  After informed consent was reviewed with the patient, including risks, benefits, and alternatives of the surgical procedure, she was transported to the operating room and placed on the table in the supine position.  General  anesthesia was induced and found to be adequate.  She was then prepped and draped in the normal sterile fashion.  After an appropriate timeout was performed, Advincula retractor was placed in her uterus with some difficulty.  Gowns and gloves were  changed and restricted to the abdominal portion of the case.  An 8 mm supraumbilical incision was made above the level where her mesh had been placed.  Veress needle was used to obtain pneumoperitoneum after passing the hanging drop test with an opening  pressure of 1 mmHg.  The camera was placed under direct  visualization.  Omental adhesions were noted; however, the camera had been placed where these adhesions were not.  Accessory ports were placed on both the right and left under direct visualization  there was some limitation due to the omental adhesions as previously mentioned; however, we were able to see it to place all ports.  Surgeon broke scrub and was seated at the console after the robot was docked.  The intestinal adhesions on the left side  as well as in the midline were lysed with the vessel sealer. Visualization of uterus confirmed perforation of uterus by advincula manipulator.  Under direct visualization this was withdrawn to facilitate uterine manipulation within uterus.  The left tube was grasped at the fimbriated end and tube and ovary were excised as well as the uteroovarian ligament to the level of the cornua.  The round ligament and coronal ligaments were  ligated to the level of the uterine artery.  The uterine artery was ligated.  The bladder flap was created to the midline.  Attention was then turned to the right side.  The right tube and ovary were elevated and excised to the level of the cornua.  The  round ligament and cardinal ligaments were ligated to the level of the uterine artery.  The uterine artery was ligated and the bladder flap was connected from the left.  The cuff was palpated and the colpotomy was performed.  The uterus, cervix,  bilateral fallopian tubes, and ovaries were delivered through the vagina.  At various times through procedure ureters were confidentally identified.  The vaginal cuff was closed with 0 V-Loc suture.  The sites were noted to be hemostatic.  The instruments were removed.  The laparoscopic camera was reintroduced to  make sure that there had been no injury to the omentum with entry of the various instruments.  This was confirmed that there had not been. The trocars were removed after the pneumoperitoneum was evacuated.  The vaginal cuff was inspected  and found to be  intact.  The trocar sites were closed with 4-0 Vicryl and Dermabond.  The patient tolerated the procedure well.  Sponge, lap and needle counts were correct x2 per the operating room staff.   PUS D: 06/07/2023 1:12:17 pm T: 06/07/2023 2:17:00 pm  JOB: [id number]/ [id number]

## 2023-06-07 NOTE — Discharge Instructions (Signed)
 You last had two Percocet tablets at 5:40 pm tonight. You may take Ibuprofen or Advil anytime if needed.  You may take the Gabapentin anytime if needed.

## 2023-06-07 NOTE — Brief Op Note (Signed)
 06/07/2023  1:00 PM  PATIENT:  Holly Leach  53 y.o. female  PRE-OPERATIVE DIAGNOSIS:  postmenopausal bleeding, ER+ breast cancer  POST-OPERATIVE DIAGNOSIS:  postmenopausal bleeding, ER+ breast cancer  PROCEDURE:  Procedure(s): XI ROBOTIC ASSISTED LAPAROSCOPIC HYSTERECTOMY WITH BILATERAL SALPINGO-OOPHORECTOMY (Bilateral) XI ROBOTIC ASSISTED LAPAROSCOPIC LYSIS OF ADHESION  SURGEON:  Surgeons and Role:    * Bovard-Stuckert, Ezzie, MD - Primary  ASSISTANTS: Krietemeyer, Heather RNFA   ANESTHESIA:   local and general  EBL:  50 mL IVF per anesthesia, uop clear urine at end of procedure 50cc  DRAINS: Urinary Catheter (Foley)   LOCAL MEDICATIONS USED:  MARCAINE      SPECIMEN:  Source of Specimen:  uterus, cervix, bilateral salpingoopherectomy  DISPOSITION OF SPECIMEN:  PATHOLOGY  COUNTS:  YES  TOURNIQUET:  * No tourniquets in log *  DICTATION: .Other Dictation: Dictation Number [id number]  PLAN OF CARE:  extended recovery  PATIENT DISPOSITION:  PACU - hemodynamically stable.   Delay start of Pharmacological VTE agent (>24hrs) due to surgical blood loss or risk of bleeding: not applicable

## 2023-06-08 ENCOUNTER — Encounter (HOSPITAL_BASED_OUTPATIENT_CLINIC_OR_DEPARTMENT_OTHER): Payer: Self-pay | Admitting: Obstetrics and Gynecology

## 2023-06-08 LAB — SURGICAL PATHOLOGY

## 2023-06-09 NOTE — Anesthesia Postprocedure Evaluation (Signed)
 Anesthesia Post Note  Patient: Holly Leach  Procedure(s) Performed: XI ROBOTIC ASSISTED LAPAROSCOPIC HYSTERECTOMY WITH BILATERAL SALPINGO-OOPHORECTOMY (Bilateral) XI ROBOTIC ASSISTED LAPAROSCOPIC LYSIS OF ADHESION (Abdomen)     Patient location during evaluation: PACU Anesthesia Type: General Level of consciousness: awake and alert Pain management: pain level controlled Vital Signs Assessment: post-procedure vital signs reviewed and stable Respiratory status: spontaneous breathing, nonlabored ventilation, respiratory function stable and patient connected to nasal cannula oxygen Cardiovascular status: blood pressure returned to baseline and stable Postop Assessment: no apparent nausea or vomiting Anesthetic complications: no   No notable events documented.  Last Vitals:  Vitals:   06/07/23 1530 06/07/23 1730  BP: 123/70 132/73  Pulse: 77 97  Resp: 16 18  Temp:  37.1 C  SpO2: 94% 97%    Last Pain:  Vitals:   06/07/23 1730  TempSrc:   PainSc: 7                  Bethannie Iglehart S

## 2023-06-15 ENCOUNTER — Other Ambulatory Visit: Payer: Self-pay | Admitting: Obstetrics and Gynecology

## 2023-06-15 DIAGNOSIS — Z90711 Acquired absence of uterus with remaining cervical stump: Secondary | ICD-10-CM

## 2023-06-16 ENCOUNTER — Ambulatory Visit
Admission: RE | Admit: 2023-06-16 | Discharge: 2023-06-16 | Disposition: A | Discharge status: Home / Self Care | Payer: BC Managed Care – PPO | Source: Ambulatory Visit | Attending: Obstetrics and Gynecology | Admitting: Obstetrics and Gynecology

## 2023-06-16 DIAGNOSIS — Z90711 Acquired absence of uterus with remaining cervical stump: Secondary | ICD-10-CM

## 2023-06-16 MED ORDER — IOPAMIDOL (ISOVUE-300) INJECTION 61%
100.0000 mL | Freq: Once | INTRAVENOUS | Status: AC | PRN
  Administered 2023-06-16: 100 mL via INTRAVENOUS

## 2023-06-29 ENCOUNTER — Other Ambulatory Visit: Payer: Self-pay

## 2023-06-29 ENCOUNTER — Inpatient Hospital Stay: Payer: BC Managed Care – PPO | Attending: Hematology & Oncology

## 2023-06-29 ENCOUNTER — Encounter: Payer: Self-pay | Admitting: Family

## 2023-06-29 ENCOUNTER — Inpatient Hospital Stay (HOSPITAL_BASED_OUTPATIENT_CLINIC_OR_DEPARTMENT_OTHER): Payer: BC Managed Care – PPO | Admitting: Family

## 2023-06-29 VITALS — BP 121/70 | HR 88 | Wt 226.4 lb

## 2023-06-29 DIAGNOSIS — E7801 Familial hypercholesterolemia: Secondary | ICD-10-CM

## 2023-06-29 DIAGNOSIS — Z79811 Long term (current) use of aromatase inhibitors: Secondary | ICD-10-CM | POA: Insufficient documentation

## 2023-06-29 DIAGNOSIS — Z90710 Acquired absence of both cervix and uterus: Secondary | ICD-10-CM | POA: Insufficient documentation

## 2023-06-29 DIAGNOSIS — Z1722 Progesterone receptor negative status: Secondary | ICD-10-CM | POA: Diagnosis not present

## 2023-06-29 DIAGNOSIS — C50412 Malignant neoplasm of upper-outer quadrant of left female breast: Secondary | ICD-10-CM | POA: Diagnosis not present

## 2023-06-29 DIAGNOSIS — Z90722 Acquired absence of ovaries, bilateral: Secondary | ICD-10-CM | POA: Insufficient documentation

## 2023-06-29 DIAGNOSIS — Z170 Estrogen receptor positive status [ER+]: Secondary | ICD-10-CM | POA: Insufficient documentation

## 2023-06-29 DIAGNOSIS — C50912 Malignant neoplasm of unspecified site of left female breast: Secondary | ICD-10-CM | POA: Diagnosis present

## 2023-06-29 DIAGNOSIS — Z9221 Personal history of antineoplastic chemotherapy: Secondary | ICD-10-CM | POA: Diagnosis not present

## 2023-06-29 DIAGNOSIS — Z923 Personal history of irradiation: Secondary | ICD-10-CM | POA: Diagnosis not present

## 2023-06-29 DIAGNOSIS — Z1732 Human epidermal growth factor receptor 2 negative status: Secondary | ICD-10-CM | POA: Insufficient documentation

## 2023-06-29 LAB — CBC WITH DIFFERENTIAL (CANCER CENTER ONLY)
Abs Immature Granulocytes: 0.03 10*3/uL (ref 0.00–0.07)
Basophils Absolute: 0 10*3/uL (ref 0.0–0.1)
Basophils Relative: 1 %
Eosinophils Absolute: 0.1 10*3/uL (ref 0.0–0.5)
Eosinophils Relative: 2 %
HCT: 34.2 % — ABNORMAL LOW (ref 36.0–46.0)
Hemoglobin: 11.2 g/dL — ABNORMAL LOW (ref 12.0–15.0)
Immature Granulocytes: 1 %
Lymphocytes Relative: 18 %
Lymphs Abs: 1 10*3/uL (ref 0.7–4.0)
MCH: 27.7 pg (ref 26.0–34.0)
MCHC: 32.7 g/dL (ref 30.0–36.0)
MCV: 84.7 fL (ref 80.0–100.0)
Monocytes Absolute: 0.4 10*3/uL (ref 0.1–1.0)
Monocytes Relative: 7 %
Neutro Abs: 4.2 10*3/uL (ref 1.7–7.7)
Neutrophils Relative %: 71 %
Platelet Count: 291 10*3/uL (ref 150–400)
RBC: 4.04 MIL/uL (ref 3.87–5.11)
RDW: 14.4 % (ref 11.5–15.5)
WBC Count: 5.9 10*3/uL (ref 4.0–10.5)
nRBC: 0 % (ref 0.0–0.2)

## 2023-06-29 LAB — LIPID PANEL
Cholesterol: 199 mg/dL (ref 0–200)
HDL: 40 mg/dL — ABNORMAL LOW (ref >40)
LDL Cholesterol: 133 mg/dL — ABNORMAL HIGH (ref 0–99)
Total CHOL/HDL Ratio: 5 {ratio}
Triglycerides: 131 mg/dL (ref <150)
VLDL: 26 mg/dL (ref 0–40)

## 2023-06-29 LAB — CMP (CANCER CENTER ONLY)
ALT: 18 U/L (ref 0–44)
AST: 15 U/L (ref 15–41)
Albumin: 4.7 g/dL (ref 3.5–5.0)
Alkaline Phosphatase: 75 U/L (ref 38–126)
Anion gap: 9 (ref 5–15)
BUN: 18 mg/dL (ref 6–20)
CO2: 26 mmol/L (ref 22–32)
Calcium: 10.7 mg/dL — ABNORMAL HIGH (ref 8.9–10.3)
Chloride: 102 mmol/L (ref 98–111)
Creatinine: 1.03 mg/dL — ABNORMAL HIGH (ref 0.44–1.00)
GFR, Estimated: >60 mL/min (ref >60)
Glucose, Bld: 115 mg/dL — ABNORMAL HIGH (ref 70–99)
Potassium: 4.4 mmol/L (ref 3.5–5.1)
Sodium: 137 mmol/L (ref 135–145)
Total Bilirubin: 0.5 mg/dL (ref 0.0–1.2)
Total Protein: 7.4 g/dL (ref 6.5–8.1)

## 2023-06-29 LAB — LACTATE DEHYDROGENASE: LDH: 175 U/L (ref 98–192)

## 2023-06-29 NOTE — Progress Notes (Signed)
Hematology and Oncology Follow Up Visit  Holly REINERTSEN [id number] Mar 09, 1971 53 y.o. 06/29/2023   Principle Diagnosis:  Stage IIA (T2N0M0) infiltrating ductal carcinoma of the left breast- ER+/PR-/HER2-  --Oncotype score equal 53   Current Therapy:        Lumpectomy on 07/08/2022 Taxotere/Cytoxan-adjuvant therapy-s/p cycle 4/4  -- start on 08/06/2022 --completed on 10/15/2022 Radiation therapy-completed on 12/17/2022 Femara 2.5 mg p.o. daily-start on 12/30/2022 - DC on 02/09/2023, intolerance  Arimidex 1 mg p.o. daily-start on 02/15/2023   Interim History:  Ms. Azucena is here today for follow-up. She is doing quite well on Arimidex. She notes some joint stiffness in her hands and hips at times that improves with movement.  Bilateral breast exam today was negative. No mass, lesion or rash noted.  No adenopathy or lymphedema noted.  No hot flashes. Occasional night sweats. Much improved with Megace.  She had a hysterectomy with bilateral salpingo-oophorectomy and lysis of adhesions on 06/07/2023. All 3 incision sites on her abdomen have healed nicely. No s/s of infection.  No fever, chills, n/v, cough, rash, dizziness, SOB, chest pain, palpitations, abdominal pain or changes in bowel or bladder habits.  No blood loss noted. No bruising or petechiae.  No swelling, numbness or tingling in her extremities at this time.  No falls or syncope reported.  Appetite and hydration are good. Weight is stable at 226 lbs.   ECOG Performance Status: 0 - Asymptomatic  Medications:  Allergies as of 06/29/2023       Reactions   Augmentin [amoxicillin-pot Clavulanate] Diarrhea   Yeast Infection   Trulicity [dulaglutide] Other (See Comments)   Caused acute pancreatitis   Tape Itching, Rash   Adhesive with band-aid's cause bad rash/ itching        Medication List        Accurate as of June 29, 2023 10:15 AM. If you have any questions, ask your nurse or doctor.          ALPRAZolam 0.25 MG  tablet Commonly known as: XANAX Take 1 tablet (0.25 mg total) by mouth at bedtime as needed for anxiety.   anastrozole 1 MG tablet Commonly known as: ARIMIDEX Take 1 tablet (1 mg total) by mouth daily. What changed: when to take this   carvedilol 3.125 MG tablet Commonly known as: COREG Take 3.125 mg by mouth 2 (two) times daily.   cetirizine 10 MG tablet Commonly known as: ZYRTEC Take 10 mg by mouth daily.   fluticasone 50 MCG/ACT nasal spray Commonly known as: FLONASE Place 2 sprays into both nostrils daily as needed for allergies.   Levothyroxine Sodium 25 MCG Caps Take 25 mcg by mouth daily before breakfast.   lisinopril-hydrochlorothiazide 20-25 MG tablet Commonly known as: ZESTORETIC Take 1 tablet by mouth 2 (two) times daily.   megestrol 20 MG tablet Commonly known as: MEGACE Take 2 tablets (40 mg total) by mouth daily. Take 1 tablet (20 mg total) by mouth daily for hot flashes. What changed:  how much to take when to take this   Melatonin 10 MG Tabs Take 10 mg by mouth at bedtime.   montelukast 10 MG tablet Commonly known as: SINGULAIR Take 10 mg by mouth at bedtime.   prochlorperazine 10 MG tablet Commonly known as: COMPAZINE Take 1 tablet (10 mg total) by mouth every 6 (six) hours as needed for nausea or vomiting.   Vitamin D (Ergocalciferol) 1.25 MG (50000 UNIT) Caps capsule Commonly known as: DRISDOL Take 50,000 Units by mouth once  a week. Monday's        Allergies:  Allergies  Allergen Reactions   Augmentin [Amoxicillin-Pot Clavulanate] Diarrhea    Yeast Infection   Trulicity [Dulaglutide] Other (See Comments)    Caused acute pancreatitis   Tape Itching and Rash    Adhesive with band-aid's cause bad rash/ itching    Past Medical History, Surgical history, Social history, and Family History were reviewed and updated.  Review of Systems: All other 10 point review of systems is negative.   Physical Exam:  vitals were not taken for this  visit.   Wt Readings from Last 3 Encounters:  06/07/23 225 lb 4.8 oz (102.2 kg)  03/30/23 229 lb (103.9 kg)  02/25/23 224 lb (101.6 kg)    Ocular: Sclerae unicteric, pupils equal, round and reactive to light Ear-nose-throat: Oropharynx clear, dentition fair Lymphatic: No cervical, supraclavicular or axillary adenopathy Lungs no rales or rhonchi, good excursion bilaterally Heart regular rate and rhythm, no murmur appreciated Abd soft, nontender, positive bowel sounds MSK no focal spinal tenderness, no joint edema Neuro: non-focal, well-oriented, appropriate affect Breasts: Same as above.   Lab Results  Component Value Date   WBC 9.2 06/07/2023   HGB 11.0 (L) 06/07/2023   HCT 33.2 (L) 06/07/2023   MCV 84.5 06/07/2023   PLT 234 06/07/2023   Lab Results  Component Value Date   FERRITIN 121 11/19/2022   IRON 73 11/19/2022   TIBC 448 11/19/2022   UIBC 375 11/19/2022   IRONPCTSAT 16 11/19/2022   Lab Results  Component Value Date   RBC 3.93 06/07/2023   No results found for: "KPAFRELGTCHN", "LAMBDASER", "KAPLAMBRATIO" No results found for: "IGGSERUM", "IGA", "IGMSERUM" No results found for: "TOTALPROTELP", "ALBUMINELP", "A1GS", "A2GS", "BETS", "BETA2SER", "GAMS", "MSPIKE", "SPEI"   Chemistry      Component Value Date/Time   NA 129 (L) 06/07/2023 1726   K 4.4 06/07/2023 1726   CL 100 06/07/2023 1726   CO2 20 (L) 06/07/2023 1726   BUN 26 (H) 06/07/2023 1726   CREATININE 1.24 (H) 06/07/2023 1726   CREATININE 1.06 (H) 03/30/2023 0838      Component Value Date/Time   CALCIUM 9.0 06/07/2023 1726   ALKPHOS 78 06/03/2023 1410   AST 20 06/03/2023 1410   AST 27 03/30/2023 0838   ALT 25 06/03/2023 1410   ALT 45 (H) 03/30/2023 0838   BILITOT 0.4 06/03/2023 1410   BILITOT 0.5 03/30/2023 0838       Impression and Plan: Ms. Wubben is a very nice 53 yo caucasian female post total hysterectomy. She has a fairly high-grade stage IIa ductal carcinoma of the left breast with a  very high Oncotype score.  She had lumpectomy followed by chemo and radiation.  She is now on Arimidex and tolerating nicely.  Exam today was negative and she continues to do well.  Follow-up in 3 months.   Eileen Stanford, NP 1/28/202510:15 AM

## 2023-06-30 ENCOUNTER — Other Ambulatory Visit: Payer: Self-pay

## 2023-07-15 NOTE — Discharge Summary (Signed)
Discharge Summary  Note 06/07/23, 4:15pm Noted as pN, both PN and discharge Summary

## 2023-07-22 NOTE — Therapy (Signed)
 OUTPATIENT PHYSICAL THERAPY SOZO SCREENING NOTE   Patient Name: Holly Leach MRN: [id number] DOB:[birth year], 53 y.o., female Today's Date: 07/26/2023  PCP: Dois Davenport, MD REFERRING PROVIDER: Almond Lint, MD   PT End of Session - 07/26/23 [phone]     Visit Number 1   unchanged due to screen only   PT Start Time 0828    PT Stop Time 0833    PT Time Calculation (min) 5 min              Past Medical History:  Diagnosis Date   Complete tear of right ACL 2023   followed by dr Thomasena Edis ([facility])   (05-28-2023  per pt no issue w/ pain or falling currently, thinks it is ok and healed)   History of acute pancreatitis 08/16/2022   History of chemotherapy 07/2022   left breast cancer  08-06-2022  to  10-15-2022   History of exposure to tuberculosis    05-28-2023  per pt as a child exposed to tuberculosis in the home , grandfather;   stated has not had a NOT had a positive TB test   History of external beam radiation therapy 10/2022   left breast cancer  11-18-2022  to 12-17-2022   History of pulmonary embolus (PE) 10/2004   admitted for PE secondary to [facility];  started on lovenox/ coumadin-- followed by dr Myna Hidalgo (hematology/ oncology)  negative work-up for disorder   History of pyelonephritis 07/2021   Hyperlipidemia    Hypertension    Hypothyroidism    followed by pcp   Malignant neoplasm of upper-outer quadrant of left breast in female, estrogen receptor positive (HCC) 07/2022   oncologist-- dr ennever/  surgeon--- dr Donell Beers;  Stage IIA, grade 3;  IDC & DCIS,  ER+/ PR-/ HER2-;   completed chemo 10-15-2022,  completed radiation 12-17-2022   OSA on CPAP    05-28-2023  per pt last used cpap last 11/ 2023,  followed by pcp   Pre-diabetes    Wears glasses    Past Surgical History:  Procedure Laterality Date   BREAST BIOPSY Left 06/19/2022   [place] LT BREAST BX W LOC DEV 1ST LESION IMG BX SPEC US GUIDE 06/19/2022 [facility]   BREAST BIOPSY  07/07/2022   MM LT  RADIOACTIVE SEED LOC MAMMO GUIDE 07/07/2022 [facility]   BREAST LUMPECTOMY WITH RADIOACTIVE SEED AND SENTINEL LYMPH NODE BIOPSY Left 07/08/2022   Procedure: LEFT BREAST LUMPECTOMY WITH RADIOACTIVE SEED AND SENTINEL LYMPH NODE BIOPSY;  Surgeon: Almond Lint, MD;  Location: [facility];  Service: General;  Laterality: Left;   COLONOSCOPY     FINE NEEDLE ASPIRATION Left 07/28/2022   Procedure: ASPIRATION OF LEFT BREAST SEROMA;  Surgeon: Almond Lint, MD;  Location: [facility];  Service: General;  Laterality: Left;   PORT-A-CATH REMOVAL N/A 03/04/2023   Procedure: REMOVAL PORT-A-CATH;  Surgeon: Almond Lint, MD;  Location: [facility];  Service: General;  Laterality: N/A;  60   PORTACATH PLACEMENT Left 07/28/2022   Procedure: INSERTION PORT-A-CATH WITH ULTRASOUND GUIDANCE;  Surgeon: Almond Lint, MD;  [facility]: [facility];  Service: General;  Laterality: Left;   ROBOTIC ASSISTED LAPAROSCOPIC LYSIS OF ADHESION  06/07/2023   Procedure: XI ROBOTIC ASSISTED LAPAROSCOPIC LYSIS OF ADHESION;  Surgeon: Sherian Rein, MD;  Location: [facility];  Service: Gynecology;;   ROBOTIC ASSISTED TOTAL HYSTERECTOMY WITH BILATERAL SALPINGO OOPHERECTOMY Bilateral 06/07/2023   Procedure: XI ROBOTIC ASSISTED LAPAROSCOPIC HYSTERECTOMY WITH BILATERAL SALPINGO-OOPHORECTOMY;  Surgeon: Sherian Rein, MD;  Location: [facility];  Service: Gynecology;  Laterality: Bilateral;   UMBILICAL HERNIA REPAIR  07/09/2020   @[facility]   by Dr Fredonia Highland;   Robot Assisted w/ Mesh   Patient Active Problem List   Diagnosis Date Noted   S/P robot-assisted surgical procedure 06/07/2023   Genetic testing 01/04/2023   Stage II breast cancer, left (HCC) 07/27/2022   Neck mass 11/03/2013   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/03/2013    REFERRING DIAG: left breast cancer at risk for lymphedema  THERAPY DIAG:  Malignant neoplasm of upper-outer quadrant of left breast in female, estrogen receptor positive (HCC)  PERTINENT HISTORY:  Patient was diagnosed on 06/19/22 with left grade 2. It measures 1.4 x1.3 x 1.1 cm and is located in the upper outer quadrant. It is ER+, PR-, HER2-  with a Ki67 of 85%. Pt underwent a  L breast lumpectomy and SLNB on 07/08/22 with 0+/3 LN, followed by 4 cycles of chemo and is presently having radiation.. Radiation completed July 18   PRECAUTIONS: left UE Lymphedema risk,   SUBJECTIVE: I am not having any issues.  PAIN:  Are you having pain? No  SOZO SCREENING: Patient was assessed today using the SOZO machine to determine the lymphedema index score. This was compared to her baseline score. It was determined that she is within the recommended range when compared to her baseline and no further action is needed at this time. She will continue SOZO screenings. These are done every 3 months for 2 years post operatively followed by every 6 months for 2 years, and then annually.  P: 3 months  [facility], PT 07/26/2023, 8:33 AM

## 2023-07-26 ENCOUNTER — Ambulatory Visit: Payer: BC Managed Care – PPO | Attending: General Surgery | Admitting: Physical Therapy

## 2023-07-26 DIAGNOSIS — Z170 Estrogen receptor positive status [ER+]: Secondary | ICD-10-CM | POA: Insufficient documentation

## 2023-07-26 DIAGNOSIS — C50412 Malignant neoplasm of upper-outer quadrant of left female breast: Secondary | ICD-10-CM | POA: Insufficient documentation

## 2023-07-27 ENCOUNTER — Other Ambulatory Visit: Payer: Self-pay

## 2023-08-03 ENCOUNTER — Other Ambulatory Visit: Payer: Self-pay | Admitting: General Surgery

## 2023-08-03 DIAGNOSIS — Z171 Estrogen receptor negative status [ER-]: Secondary | ICD-10-CM

## 2023-08-05 ENCOUNTER — Ambulatory Visit
Admission: RE | Admit: 2023-08-05 | Discharge: 2023-08-05 | Disposition: A | Discharge status: Home / Self Care | Source: Ambulatory Visit | Attending: General Surgery | Admitting: General Surgery

## 2023-08-05 DIAGNOSIS — Z171 Estrogen receptor negative status [ER-]: Secondary | ICD-10-CM

## 2023-08-05 MED ORDER — GADOPICLENOL 0.5 MMOL/ML IV SOLN
10.0000 mL | Freq: Once | INTRAVENOUS | Status: AC | PRN
  Administered 2023-08-05: 10 mL via INTRAVENOUS

## 2023-09-05 ENCOUNTER — Other Ambulatory Visit: Payer: Self-pay | Admitting: Hematology & Oncology

## 2023-09-06 ENCOUNTER — Encounter: Payer: Self-pay | Admitting: Hematology & Oncology

## 2023-09-15 ENCOUNTER — Other Ambulatory Visit: Payer: Self-pay

## 2023-09-27 ENCOUNTER — Encounter: Payer: Self-pay | Admitting: Hematology & Oncology

## 2023-09-28 ENCOUNTER — Encounter: Payer: Self-pay | Admitting: Hematology & Oncology

## 2023-09-28 ENCOUNTER — Encounter: Payer: Self-pay | Admitting: *Deleted

## 2023-09-28 ENCOUNTER — Inpatient Hospital Stay (HOSPITAL_BASED_OUTPATIENT_CLINIC_OR_DEPARTMENT_OTHER): Payer: BC Managed Care – PPO | Admitting: Hematology & Oncology

## 2023-09-28 ENCOUNTER — Other Ambulatory Visit: Payer: Self-pay

## 2023-09-28 ENCOUNTER — Inpatient Hospital Stay: Payer: BC Managed Care – PPO | Attending: Hematology & Oncology

## 2023-09-28 VITALS — BP 106/72 | HR 83 | Temp 98.1°F | Resp 16 | Ht 63.0 in | Wt 238.0 lb

## 2023-09-28 DIAGNOSIS — Z79890 Hormone replacement therapy (postmenopausal): Secondary | ICD-10-CM | POA: Diagnosis not present

## 2023-09-28 DIAGNOSIS — Z923 Personal history of irradiation: Secondary | ICD-10-CM | POA: Insufficient documentation

## 2023-09-28 DIAGNOSIS — Z79811 Long term (current) use of aromatase inhibitors: Secondary | ICD-10-CM | POA: Diagnosis not present

## 2023-09-28 DIAGNOSIS — Z170 Estrogen receptor positive status [ER+]: Secondary | ICD-10-CM | POA: Diagnosis not present

## 2023-09-28 DIAGNOSIS — C50912 Malignant neoplasm of unspecified site of left female breast: Secondary | ICD-10-CM | POA: Insufficient documentation

## 2023-09-28 DIAGNOSIS — Z9221 Personal history of antineoplastic chemotherapy: Secondary | ICD-10-CM | POA: Diagnosis not present

## 2023-09-28 DIAGNOSIS — Z79899 Other long term (current) drug therapy: Secondary | ICD-10-CM | POA: Diagnosis not present

## 2023-09-28 DIAGNOSIS — E7801 Familial hypercholesterolemia: Secondary | ICD-10-CM

## 2023-09-28 LAB — CBC WITH DIFFERENTIAL (CANCER CENTER ONLY)
Abs Immature Granulocytes: 0.07 10*3/uL (ref 0.00–0.07)
Basophils Absolute: 0.1 10*3/uL (ref 0.0–0.1)
Basophils Relative: 1 %
Eosinophils Absolute: 0.1 10*3/uL (ref 0.0–0.5)
Eosinophils Relative: 1 %
HCT: 33.8 % — ABNORMAL LOW (ref 36.0–46.0)
Hemoglobin: 11.4 g/dL — ABNORMAL LOW (ref 12.0–15.0)
Immature Granulocytes: 1 %
Lymphocytes Relative: 23 %
Lymphs Abs: 1.6 10*3/uL (ref 0.7–4.0)
MCH: 28.1 pg (ref 26.0–34.0)
MCHC: 33.7 g/dL (ref 30.0–36.0)
MCV: 83.3 fL (ref 80.0–100.0)
Monocytes Absolute: 0.4 10*3/uL (ref 0.1–1.0)
Monocytes Relative: 6 %
Neutro Abs: 4.7 10*3/uL (ref 1.7–7.7)
Neutrophils Relative %: 68 %
Platelet Count: 261 10*3/uL (ref 150–400)
RBC: 4.06 MIL/uL (ref 3.87–5.11)
RDW: 14.8 % (ref 11.5–15.5)
WBC Count: 6.9 10*3/uL (ref 4.0–10.5)
nRBC: 0 % (ref 0.0–0.2)

## 2023-09-28 LAB — CMP (CANCER CENTER ONLY)
ALT: 16 U/L (ref 0–44)
AST: 13 U/L — ABNORMAL LOW (ref 15–41)
Albumin: 4.2 g/dL (ref 3.5–5.0)
Alkaline Phosphatase: 86 U/L (ref 38–126)
Anion gap: 9 (ref 5–15)
BUN: 17 mg/dL (ref 6–20)
CO2: 25 mmol/L (ref 22–32)
Calcium: 10 mg/dL (ref 8.9–10.3)
Chloride: 104 mmol/L (ref 98–111)
Creatinine: 1.07 mg/dL — ABNORMAL HIGH (ref 0.44–1.00)
GFR, Estimated: >60 mL/min (ref >60)
Glucose, Bld: 110 mg/dL — ABNORMAL HIGH (ref 70–99)
Potassium: 4.5 mmol/L (ref 3.5–5.1)
Sodium: 138 mmol/L (ref 135–145)
Total Bilirubin: 0.4 mg/dL (ref 0.0–1.2)
Total Protein: 7.2 g/dL (ref 6.5–8.1)

## 2023-09-28 LAB — LIPID PANEL
Cholesterol: 174 mg/dL (ref 0–200)
HDL: 40 mg/dL — ABNORMAL LOW (ref >40)
LDL Cholesterol: 111 mg/dL — ABNORMAL HIGH (ref 0–99)
Total CHOL/HDL Ratio: 4.4 ratio
Triglycerides: 116 mg/dL (ref <150)
VLDL: 23 mg/dL (ref 0–40)

## 2023-09-28 LAB — LACTATE DEHYDROGENASE: LDH: 120 U/L (ref 98–192)

## 2023-09-28 NOTE — Progress Notes (Signed)
 Hematology and Oncology Follow Up Visit  Holly Leach [id number] January 04, 1971 53 y.o. 09/28/2023   Principle Diagnosis:  Stage IIA (T2N0M0) infiltrating ductal carcinoma of the left breast- ER+/PR-/HER2-  --Oncotype score equal 53  Current Therapy:   Lumpectomy on 07/08/2022 Taxotere /Cytoxan -adjuvant therapy-s/p cycle 4/4  -- start on 08/06/2022 --completed on 10/15/2022 Radiation therapy-completed on 12/17/2022 Femara  2.5 mg p.o. daily-start on 12/30/2022 -DC on 02/09/2023 Arimidex  1 mg p.o. daily-start on 02/15/2023     Interim History:  Holly Leach is back for follow-up.  We last saw her back in January.  Since that time, she actually has had a hysterectomy.  She has some breakthrough bleeding.  She underwent a robotic hysterectomy.  She had no problems with this.  She did have a bilateral breast mammogram on 08/24/2023.  This also did not show any evidence of malignancy.  She does have a little bit of arthritis in the hands.  There is no problems with hot flashes.  The Megace  seems to work very well for her.  She is has a new job.  She enjoys her new job.[employer]  She will be going down to [facility] for a [employer] this weekend.  I am sure that she will have a wonderful time and learn.  She has had no issues with cough or shortness of breath.  She has had no fever.  She has had no nausea or vomiting.  There has been no change in bowel or bladder habits.  Overall, I would say that her performance status is probably ECOG 1.  Medications:  Current Outpatient Medications:    ALPRAZolam  (XANAX ) 0.25 MG tablet, Take 1 tablet (0.25 mg total) by mouth at bedtime as needed for anxiety., Disp: 30 tablet, Rfl: 0   anastrozole  (ARIMIDEX ) 1 MG tablet, Take 1 tablet (1 mg total) by mouth daily. (Patient taking differently: Take 1 mg by mouth at bedtime.), Disp: 30 tablet, Rfl: 12   carvedilol  (COREG ) 3.125 MG tablet, Take 3.125 mg by mouth 2 (two) times daily., Disp: , Rfl:    cetirizine (ZYRTEC)  10 MG tablet, Take 10 mg by mouth daily., Disp: , Rfl:    fluticasone  (FLONASE ) 50 MCG/ACT nasal spray, Place 2 sprays into both nostrils daily as needed for allergies., Disp: , Rfl:    Levothyroxine  Sodium 25 MCG CAPS, Take 25 mcg by mouth daily before breakfast., Disp: , Rfl:    lisinopril -hydrochlorothiazide  (ZESTORETIC ) 20-25 MG tablet, Take 1 tablet by mouth 2 (two) times daily., Disp: , Rfl:    megestrol  (MEGACE ) 20 MG tablet, TAKE 2 TABLETS BY MOUTH DAILY FOR HOT FLASHES, Disp: 60 tablet, Rfl: 5   Melatonin 10 MG TABS, Take 10 mg by mouth at bedtime., Disp: , Rfl:    montelukast  (SINGULAIR ) 10 MG tablet, Take 10 mg by mouth at bedtime., Disp: , Rfl:    prochlorperazine  (COMPAZINE ) 10 MG tablet, Take 1 tablet (10 mg total) by mouth every 6 (six) hours as needed for nausea or vomiting., Disp: 30 tablet, Rfl: 1   Vitamin D , Ergocalciferol , (DRISDOL) 1.25 MG (50000 UNIT) CAPS capsule, Take 50,000 Units by mouth once a week. Monday's, Disp: , Rfl:   Allergies:  Allergies  Allergen Reactions   Augmentin [Amoxicillin-Pot Clavulanate] Diarrhea    Yeast Infection   Trulicity [Dulaglutide] Other (See Comments)    Caused acute pancreatitis   Tape Itching and Rash    Adhesive with band-aid's cause bad rash/ itching    Past Medical History, Surgical history, Social history,  and Family History were reviewed and updated.  Review of Systems: Review of Systems  Constitutional: Negative.   HENT:  Negative.    Eyes: Negative.   Respiratory: Negative.    Cardiovascular: Negative.   Gastrointestinal: Negative.   Endocrine: Negative.   Genitourinary: Negative.    Musculoskeletal: Negative.   Skin: Negative.   Neurological: Negative.   Hematological: Negative.   Psychiatric/Behavioral: Negative.      Physical Exam: Vital signs show temperature 98.1.  Pulse 83.  Blood pressure 106/72.  Weight is 238 pounds.  Wt Readings from Last 3 Encounters:  06/29/23 226 lb 6.4 oz (102.7 kg)  06/07/23  225 lb 4.8 oz (102.2 kg)  03/30/23 229 lb (103.9 kg)    Physical Exam Vitals reviewed.  Constitutional:      Comments: Breast exam shows right breast no masses, edema or erythema.  There is no right axillary adenopathy.  Left breast shows the lumpectomy at about the 2 o'clock position.  She has some hyperpigmentation.   She has no left axillary adenopathy.  HENT:     Head: Normocephalic and atraumatic.  Eyes:     Pupils: Pupils are equal, round, and reactive to light.  Cardiovascular:     Rate and Rhythm: Normal rate and regular rhythm.     Heart sounds: Normal heart sounds.  Pulmonary:     Effort: Pulmonary effort is normal.     Breath sounds: Normal breath sounds.  Abdominal:     General: Bowel sounds are normal.     Palpations: Abdomen is soft.  Musculoskeletal:        General: No tenderness or deformity. Normal range of motion.     Cervical back: Normal range of motion.  Lymphadenopathy:     Cervical: No cervical adenopathy.  Skin:    General: Skin is warm and dry.     Findings: No erythema or rash.  Neurological:     Mental Status: She is alert and oriented to person, place, and time.  Psychiatric:        Behavior: Behavior normal.        Thought Content: Thought content normal.        Judgment: Judgment normal.     Lab Results  Component Value Date   WBC 6.9 09/28/2023   HGB 11.4 (L) 09/28/2023   HCT 33.8 (L) 09/28/2023   MCV 83.3 09/28/2023   PLT 261 09/28/2023     Chemistry      Component Value Date/Time   NA 137 06/29/2023 1004   K 4.4 06/29/2023 1004   CL 102 06/29/2023 1004   CO2 26 06/29/2023 1004   BUN 18 06/29/2023 1004   CREATININE 1.03 (H) 06/29/2023 1004      Component Value Date/Time   CALCIUM 10.7 (H) 06/29/2023 1004   ALKPHOS 75 06/29/2023 1004   AST 15 06/29/2023 1004   ALT 18 06/29/2023 1004   BILITOT 0.5 06/29/2023 1004       Impression and Plan: Holly Leach is a very nice 53 year old perimenopausal white female.  She has a  fairly high-grade stage IIa ductal carcinoma of the left breast.  This has a very high Oncotype score.  Has a very high proliferation index.  I think I will give her adjuvant chemotherapy with Taxotere /Cytoxan .  She is doing well on the Arimidex .  We will continue her on the Arimidex .  I am glad that she had the hysterectomy.  I know this will alleviate any issues that she may have  with respect to any type of bleeding or any malignancy.  The MRI look fantastic.  She does not need another 1 for another year.  Will we will now get her back in August.  The we can now get her through the Holiday season.  We will plan to get her back in 3 months.   Ivor Mars, MD 4/29/20258:06 AM

## 2023-09-29 ENCOUNTER — Other Ambulatory Visit: Payer: Self-pay

## 2023-10-20 ENCOUNTER — Encounter: Payer: Self-pay | Admitting: Hematology & Oncology

## 2023-10-26 ENCOUNTER — Ambulatory Visit: Payer: BC Managed Care – PPO | Attending: General Surgery

## 2023-10-26 VITALS — Wt 234.4 lb

## 2023-10-26 DIAGNOSIS — Z483 Aftercare following surgery for neoplasm: Secondary | ICD-10-CM | POA: Insufficient documentation

## 2023-10-26 NOTE — Therapy (Signed)
 OUTPATIENT PHYSICAL THERAPY SOZO SCREENING NOTE   Patient Name: Holly Leach MRN: [id number] DOB:1971/01/22, 53 y.o., female Today's Date: 10/26/2023  PCP: Allene Ivan, MD REFERRING PROVIDER: Lockie Rima, MD   PT End of Session - 10/26/23 1027     Visit Number 1   # unchanged due to screen only   PT Start Time 0818    PT Stop Time 0823    PT Time Calculation (min) 5 min    Activity Tolerance Patient tolerated treatment well    Behavior During Therapy [facility] for tasks assessed/performed              Past Medical History:  Diagnosis Date   Complete tear of right ACL 2023   followed by dr Hazeline Lister ([facility])   (05-28-2023  per pt no issue w/ pain or falling currently, thinks it is ok and healed)   History of acute pancreatitis 08/16/2022   History of chemotherapy 07/2022   left breast cancer  08-06-2022  to  10-15-2022   History of exposure to tuberculosis    05-28-2023  per pt as a child exposed to tuberculosis in the home , grandfather;   stated has not had a NOT had a positive TB test   History of external beam radiation therapy 10/2022   left breast cancer  11-18-2022  to 12-17-2022   History of pulmonary embolus (PE) 10/2004   admitted for PE secondary to [facility];  started on lovenox/ coumadin-- followed by dr Maria Shiner (hematology/ oncology)  negative work-up for disorder   History of pyelonephritis 07/2021   Hyperlipidemia    Hypertension    Hypothyroidism    followed by pcp   Malignant neoplasm of upper-outer quadrant of left breast in female, estrogen receptor positive (HCC) 07/2022   oncologist-- dr ennever/  surgeon--- dr Cherlynn Cornfield;  Stage IIA, grade 3;  IDC & DCIS,  ER+/ PR-/ HER2-;   completed chemo 10-15-2022,  completed radiation 12-17-2022   OSA on CPAP    05-28-2023  per pt last used cpap last 11/ 2023,  followed by pcp   Pre-diabetes    Wears glasses    Past Surgical History:  Procedure Laterality Date   BREAST BIOPSY Left 06/19/2022   US  LT  BREAST BX W LOC DEV 1ST LESION IMG BX SPEC US  GUIDE 06/19/2022 [facility]   BREAST BIOPSY  07/07/2022   MM LT RADIOACTIVE SEED LOC MAMMO GUIDE 07/07/2022 [facility]   BREAST LUMPECTOMY WITH RADIOACTIVE SEED AND SENTINEL LYMPH NODE BIOPSY Left 07/08/2022   Procedure: LEFT BREAST LUMPECTOMY WITH RADIOACTIVE SEED AND SENTINEL LYMPH NODE BIOPSY;  Surgeon: Lockie Rima, MD;  Location: [facility];  Service: General;  Laterality: Left;   COLONOSCOPY     FINE NEEDLE ASPIRATION Left 07/28/2022   Procedure: ASPIRATION OF LEFT BREAST SEROMA;  Surgeon: Lockie Rima, MD;  Location: [facility];  Service: General;  Laterality: Left;   PORT-A-CATH REMOVAL N/A 03/04/2023   Procedure: REMOVAL PORT-A-CATH;  Surgeon: Lockie Rima, MD;  Location: [facility];  Service: General;  Laterality: N/A;  60   PORTACATH PLACEMENT Left 07/28/2022   Procedure: INSERTION PORT-A-CATH WITH ULTRASOUND GUIDANCE;  Surgeon: Lockie Rima, MD;  Location: [facility];  Service: General;  Laterality: Left;   ROBOTIC ASSISTED LAPAROSCOPIC LYSIS OF ADHESION  06/07/2023   Procedure: XI ROBOTIC ASSISTED LAPAROSCOPIC LYSIS OF ADHESION;  Surgeon: Margaretmary Shaver, MD;  Location: [facility];  Service: Gynecology;;   ROBOTIC ASSISTED TOTAL HYSTERECTOMY WITH BILATERAL SALPINGO OOPHERECTOMY Bilateral  06/07/2023   Procedure: XI ROBOTIC ASSISTED LAPAROSCOPIC HYSTERECTOMY WITH BILATERAL SALPINGO-OOPHORECTOMY;  Surgeon: Margaretmary Shaver, MD;  Location: [facility];  Service: Gynecology;  Laterality: Bilateral;   UMBILICAL HERNIA REPAIR  07/09/2020   @[facility]   by Dr Darryll Eng;   Robot Assisted w/ Mesh   Patient Active Problem List   Diagnosis Date Noted   S/P robot-assisted surgical procedure 06/07/2023   Genetic testing 01/04/2023   Stage II breast cancer, left (HCC) 07/27/2022   Neck mass 11/03/2013   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/03/2013    REFERRING DIAG: left breast cancer at risk for lymphedema  THERAPY DIAG: Aftercare  following surgery for neoplasm  PERTINENT HISTORY: Patient was diagnosed on 06/19/22 with left grade 2. It measures 1.4 x1.3 x 1.1 cm and is located in the upper outer quadrant. It is ER+, PR-, HER2-  with a Ki67 of 85%. Pt underwent a  L breast lumpectomy and SLNB on 07/08/22 with 0+/3 LN, followed by 4 cycles of chemo and is presently having radiation.. Radiation completed July 18   PRECAUTIONS: left UE Lymphedema risk,   SUBJECTIVE: Pt returns for her 3 month L-Dex screen. "I fell over the weekend. I've been having trouble with this knee (Lt) for awhile and actually already have an MRI scheduled for June. But I hurt it more when I fell Sunday."  PAIN:  PAIN:  Are you having pain? Yes NPRS scale: 7/10 Pain location: knee Pain orientation: Left  PAIN TYPE: sharp Pain description: constant  Aggravating factors: standing Relieving factors: RICE   SOZO SCREENING: Patient was assessed today using the SOZO machine to determine the lymphedema index score. This was compared to her baseline score. It was determined that she is within the recommended range when compared to her baseline and no further action is needed at this time. She will continue SOZO screenings. These are done every 3 months for 2 years post operatively followed by every 6 months for 2 years, and then annually.   L-DEX FLOWSHEETS - 10/26/23 0800       L-DEX LYMPHEDEMA SCREENING   Measurement Type Unilateral    L-DEX MEASUREMENT EXTREMITY Upper Extremity    POSITION  Standing    DOMINANT SIDE Right    At Risk Side Left    BASELINE SCORE (UNILATERAL) -0.2    L-DEX SCORE (UNILATERAL) 4    VALUE CHANGE (UNILAT) 4.2              P: Cont 3 months  Denyce Flank, PTA 10/26/2023, 8:22 AM

## 2023-11-07 ENCOUNTER — Other Ambulatory Visit: Payer: Self-pay

## 2023-11-08 ENCOUNTER — Ambulatory Visit
Admission: RE | Admit: 2023-11-08 | Discharge: 2023-11-08 | Disposition: A | Discharge status: Home / Self Care | Source: Ambulatory Visit | Attending: General Surgery | Admitting: General Surgery

## 2023-11-08 DIAGNOSIS — N6321 Unspecified lump in the left breast, upper outer quadrant: Secondary | ICD-10-CM

## 2024-01-24 ENCOUNTER — Ambulatory Visit

## 2024-01-26 ENCOUNTER — Other Ambulatory Visit: Payer: Self-pay

## 2024-01-28 ENCOUNTER — Inpatient Hospital Stay

## 2024-01-28 ENCOUNTER — Inpatient Hospital Stay: Admitting: Hematology & Oncology

## 2024-01-29 ENCOUNTER — Other Ambulatory Visit: Payer: Self-pay

## 2024-02-07 ENCOUNTER — Other Ambulatory Visit: Payer: Self-pay | Admitting: Hematology & Oncology

## 2024-02-13 ENCOUNTER — Other Ambulatory Visit: Payer: Self-pay

## 2024-02-25 ENCOUNTER — Inpatient Hospital Stay

## 2024-02-25 ENCOUNTER — Ambulatory Visit: Admitting: Hematology & Oncology

## 2024-03-09 ENCOUNTER — Encounter: Payer: Self-pay | Admitting: Hematology & Oncology

## 2024-03-09 ENCOUNTER — Inpatient Hospital Stay: Attending: Hematology & Oncology

## 2024-03-09 ENCOUNTER — Inpatient Hospital Stay (HOSPITAL_BASED_OUTPATIENT_CLINIC_OR_DEPARTMENT_OTHER): Admitting: Hematology & Oncology

## 2024-03-09 VITALS — BP 99/75 | HR 95 | Temp 98.2°F | Resp 18 | Wt 238.2 lb

## 2024-03-09 DIAGNOSIS — Z170 Estrogen receptor positive status [ER+]: Secondary | ICD-10-CM | POA: Diagnosis not present

## 2024-03-09 DIAGNOSIS — Z923 Personal history of irradiation: Secondary | ICD-10-CM | POA: Diagnosis not present

## 2024-03-09 DIAGNOSIS — Z1722 Progesterone receptor negative status: Secondary | ICD-10-CM | POA: Diagnosis not present

## 2024-03-09 DIAGNOSIS — R232 Flushing: Secondary | ICD-10-CM | POA: Diagnosis not present

## 2024-03-09 DIAGNOSIS — Z79899 Other long term (current) drug therapy: Secondary | ICD-10-CM | POA: Diagnosis not present

## 2024-03-09 DIAGNOSIS — C50912 Malignant neoplasm of unspecified site of left female breast: Secondary | ICD-10-CM | POA: Diagnosis not present

## 2024-03-09 DIAGNOSIS — Z79811 Long term (current) use of aromatase inhibitors: Secondary | ICD-10-CM | POA: Diagnosis not present

## 2024-03-09 DIAGNOSIS — Z1732 Human epidermal growth factor receptor 2 negative status: Secondary | ICD-10-CM | POA: Insufficient documentation

## 2024-03-09 LAB — CMP (CANCER CENTER ONLY)
ALT: 35 U/L (ref 0–44)
AST: 30 U/L (ref 15–41)
Albumin: 4.4 g/dL (ref 3.5–5.0)
Alkaline Phosphatase: 87 U/L (ref 38–126)
Anion gap: 14 (ref 5–15)
BUN: 17 mg/dL (ref 6–20)
CO2: 20 mmol/L — ABNORMAL LOW (ref 22–32)
Calcium: 9.8 mg/dL (ref 8.9–10.3)
Chloride: 105 mmol/L (ref 98–111)
Creatinine: 1.2 mg/dL — ABNORMAL HIGH (ref 0.44–1.00)
GFR, Estimated: 54 mL/min — ABNORMAL LOW (ref >60)
Glucose, Bld: 104 mg/dL — ABNORMAL HIGH (ref 70–99)
Potassium: 4.4 mmol/L (ref 3.5–5.1)
Sodium: 139 mmol/L (ref 135–145)
Total Bilirubin: 0.4 mg/dL (ref 0.0–1.2)
Total Protein: 7.4 g/dL (ref 6.5–8.1)

## 2024-03-09 LAB — CBC WITH DIFFERENTIAL (CANCER CENTER ONLY)
Abs Immature Granulocytes: 0.07 K/uL (ref 0.00–0.07)
Basophils Absolute: 0 K/uL (ref 0.0–0.1)
Basophils Relative: 1 %
Eosinophils Absolute: 0 K/uL (ref 0.0–0.5)
Eosinophils Relative: 1 %
HCT: 33.6 % — ABNORMAL LOW (ref 36.0–46.0)
Hemoglobin: 11.4 g/dL — ABNORMAL LOW (ref 12.0–15.0)
Immature Granulocytes: 1 %
Lymphocytes Relative: 23 %
Lymphs Abs: 1.3 K/uL (ref 0.7–4.0)
MCH: 29.1 pg (ref 26.0–34.0)
MCHC: 33.9 g/dL (ref 30.0–36.0)
MCV: 85.7 fL (ref 80.0–100.0)
Monocytes Absolute: 0.5 K/uL (ref 0.1–1.0)
Monocytes Relative: 9 %
Neutro Abs: 3.6 K/uL (ref 1.7–7.7)
Neutrophils Relative %: 65 %
Platelet Count: 269 K/uL (ref 150–400)
RBC: 3.92 MIL/uL (ref 3.87–5.11)
RDW: 14.8 % (ref 11.5–15.5)
WBC Count: 5.5 K/uL (ref 4.0–10.5)
nRBC: 0 % (ref 0.0–0.2)

## 2024-03-09 LAB — LACTATE DEHYDROGENASE: LDH: 179 U/L (ref 98–192)

## 2024-03-09 NOTE — Progress Notes (Signed)
 Hematology and Oncology Follow Up Visit  CHANETTE DEMO [id number] 1970/08/19 53 y.o. 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 daily-start on 12/30/2022 -DC on 02/09/2023 Arimidex  1 mg p.o. daily-start on 02/15/2023     Interim History:  Ms. Wahid is back for follow-up.  We were supposed to see her back in August.  However, she needed left knee surgery.  She had left knee surgery.  It sounds like it was very extensive.  She had physical therapy.  She seems to be moving the knee pretty well right now.  Otherwise, she is managing pretty well.  She has had no problems with cough or shortness of breath.  She has had no issues with fever.  There is been no nausea or vomiting.  She has had no bleeding.  The Megace  does seem to help the hot flashes which I am happy about.  She is on the Arimidex .  I think she is doing  well with the Arimidex .  She has had no headache.  She has had no rashes.  Overall, I would say that her performance status is probably ECOG 1.     Medications:  Current Outpatient Medications:    anastrozole  (ARIMIDEX ) 1 MG tablet, TAKE 1 TABLET(1 MG) BY MOUTH DAILY, Disp: 30 tablet, Rfl: 12   carvedilol  (COREG ) 3.125 MG tablet, Take 3.125 mg by mouth 2 (two) times daily., Disp: , Rfl:    cetirizine (ZYRTEC) 10 MG tablet, Take 10 mg by mouth daily., Disp: , Rfl:    fluticasone  (FLONASE ) 50 MCG/ACT nasal spray, Place 2 sprays into both nostrils daily as needed for allergies., Disp: , Rfl:    gabapentin  (NEURONTIN ) 300 MG capsule, Take 300 mg by mouth daily., Disp: , Rfl:    Levothyroxine  Sodium 25 MCG CAPS, Take 25 mcg by mouth daily before breakfast., Disp: , Rfl:     lisinopril -hydrochlorothiazide  (ZESTORETIC ) 20-25 MG tablet, Take 1 tablet by mouth 2 (two) times daily., Disp: , Rfl:    megestrol  (MEGACE ) 20 MG tablet, TAKE 2 TABLETS BY MOUTH DAILY FOR HOT FLASHES, Disp: 60 tablet, Rfl: 5   Melatonin 10 MG TABS, Take 10 mg by mouth at bedtime., Disp: , Rfl:    methocarbamol (ROBAXIN) 500 MG tablet, Take 500 mg by mouth 4 (four) times daily., Disp: , Rfl:    montelukast  (SINGULAIR ) 10 MG tablet, Take 10 mg by mouth at bedtime., Disp: , Rfl:    Vitamin D , Ergocalciferol , (DRISDOL) 1.25 MG (50000 UNIT) CAPS capsule, Take 50,000 Units by mouth once a week. Monday's, Disp: , Rfl:    ALPRAZolam  (XANAX ) 0.25 MG tablet, Take 1 tablet (0.25 mg total) by mouth at bedtime as needed for anxiety. (Patient not taking: Reported on 03/09/2024), Disp: 30 tablet, Rfl: 0  Allergies:  Allergies  Allergen Reactions   Amoxicillin-Pot Clavulanate Diarrhea and Other (See Comments)    Yeast Infection  amoxicillin / clavulanate   Trulicity [Dulaglutide] Other (See Comments)    Caused acute pancreatitis   Tape Itching and Rash    Adhesive with band-aid's cause bad rash/ itching    Past Medical History, Surgical history, Social history, and Family History were reviewed and updated.  Review of Systems: Review of Systems  Constitutional: Negative.   HENT:  Negative.    Eyes: Negative.   Respiratory: Negative.    Cardiovascular: Negative.   Gastrointestinal: Negative.   Endocrine: Negative.   Genitourinary: Negative.    Musculoskeletal: Negative.   Skin: Negative.   Neurological: Negative.   Hematological: Negative.   Psychiatric/Behavioral: Negative.      Physical Exam: Vital signs show temperature 98.2.  Pulse 95.  Blood pressure 99/75.  Weight is 238 pounds.   Wt Readings from Last 3 Encounters:  03/09/24 238 lb 4 oz (108.1 kg)  10/26/23 234 lb 6 oz (106.3 kg)  09/28/23 238 lb (108 kg)    Physical Exam Vitals reviewed.  Constitutional:      Comments:  Breast exam shows right breast no masses, edema or erythema.  There is no right axillary adenopathy.  Left breast shows the lumpectomy at about the 2 o'clock position.  She has some hyperpigmentation.   She has no left axillary adenopathy.  HENT:     Head: Normocephalic and atraumatic.  Eyes:     Pupils: Pupils are equal, round, and reactive to light.  Cardiovascular:     Rate and Rhythm: Normal rate and regular rhythm.     Heart sounds: Normal heart sounds.  Pulmonary:     Effort: Pulmonary effort is normal.     Breath sounds: Normal breath sounds.  Abdominal:     General: Bowel sounds are normal.     Palpations: Abdomen is soft.  Musculoskeletal:        General: No tenderness or deformity. Normal range of motion.     Cervical back: Normal range of motion.  Lymphadenopathy:     Cervical: No cervical adenopathy.  Skin:    General: Skin is warm and dry.     Findings: No erythema or rash.  Neurological:     Mental Status: She is alert and oriented to person, place, and time.  Psychiatric:        Behavior: Behavior normal.        Thought Content: Thought content normal.        Judgment: Judgment normal.     Lab Results  Component Value Date   WBC 5.5 03/09/2024   HGB 11.4 (L) 03/09/2024   HCT 33.6 (L) 03/09/2024   MCV 85.7 03/09/2024   PLT 269 03/09/2024     Chemistry      Component Value Date/Time   NA 138 09/28/2023 0746   K 4.5 09/28/2023 0746   CL 104 09/28/2023 0746   CO2 25 09/28/2023 0746   BUN 17 09/28/2023 0746   CREATININE 1.07 (H) 09/28/2023 0746      Component Value Date/Time   CALCIUM 10.0 09/28/2023 0746   ALKPHOS 86 09/28/2023 0746   AST 13 (L) 09/28/2023 0746   ALT 16 09/28/2023 0746   BILITOT 0.4 09/28/2023 0746       Impression and Plan: Ms. Single is a very nice 53 year old perimenopausal white female.  She has a fairly high-grade stage IIa ductal carcinoma of the left breast.  This has a very high Oncotype score.  The cancer has a very high  proliferation index.  She will had received adjuvant chemotherapy with Taxotere /Cytoxan .  He currently is on Arimidex .  She is doing well on the Arimidex .  Looks like she is recovering well from the knee surgery.  She has decent range of motion.  We will now get her through the Holiday season.  I will plan to get her back to see us  in another 4 months  or so.    Maude JONELLE Crease, MD 10/9/20258:09 AM

## 2024-03-10 ENCOUNTER — Other Ambulatory Visit: Payer: Self-pay

## 2024-03-10 LAB — CANCER ANTIGEN 27.29: CA 27.29: 12.3 U/mL (ref 0.0–38.6)

## 2024-03-16 ENCOUNTER — Other Ambulatory Visit: Payer: Self-pay

## 2024-03-19 ENCOUNTER — Emergency Department (HOSPITAL_BASED_OUTPATIENT_CLINIC_OR_DEPARTMENT_OTHER): Payer: Self-pay

## 2024-03-19 ENCOUNTER — Other Ambulatory Visit: Payer: Self-pay

## 2024-03-19 ENCOUNTER — Emergency Department (HOSPITAL_BASED_OUTPATIENT_CLINIC_OR_DEPARTMENT_OTHER)
Admission: EM | Admit: 2024-03-19 | Discharge: 2024-03-20 | Disposition: A | Discharge status: Home / Self Care | Payer: Self-pay

## 2024-03-19 ENCOUNTER — Encounter (HOSPITAL_BASED_OUTPATIENT_CLINIC_OR_DEPARTMENT_OTHER): Payer: Self-pay

## 2024-03-19 DIAGNOSIS — S62316A Displaced fracture of base of fifth metacarpal bone, right hand, initial encounter for closed fracture: Secondary | ICD-10-CM | POA: Diagnosis not present

## 2024-03-19 DIAGNOSIS — S52502A Unspecified fracture of the lower end of left radius, initial encounter for closed fracture: Secondary | ICD-10-CM | POA: Insufficient documentation

## 2024-03-19 DIAGNOSIS — Y92410 Unspecified street and highway as the place of occurrence of the external cause: Secondary | ICD-10-CM | POA: Insufficient documentation

## 2024-03-19 DIAGNOSIS — M25532 Pain in left wrist: Secondary | ICD-10-CM | POA: Diagnosis not present

## 2024-03-19 DIAGNOSIS — S6992XA Unspecified injury of left wrist, hand and finger(s), initial encounter: Secondary | ICD-10-CM | POA: Diagnosis present
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
## 2024-03-20 MED ORDER — KETOROLAC TROMETHAMINE 15 MG/ML IJ SOLN
15.0000 mg | Freq: Once | INTRAMUSCULAR | Status: AC
  Administered 2024-03-20: 15 mg via INTRAMUSCULAR
  Filled 2024-03-20: qty 1

## 2024-03-20 MED ORDER — OXYCODONE-ACETAMINOPHEN 5-325 MG PO TABS
1.0000 | ORAL_TABLET | Freq: Once | ORAL | Status: AC
  Administered 2024-03-20: 1 via ORAL
  Filled 2024-03-20: qty 1
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
## 2024-03-20 NOTE — Discharge Instructions (Signed)
 You can alternate Tylenol  and Motrin  as needed for pain.  Follow-up with orthopedic surgery tomorrow.  Call the office to make an appointment.  Return to the ER for new or worsening symptoms.

## 2024-03-20 NOTE — ED Notes (Signed)
 Pt discharged home after verbalizing understanding of discharge instructions; nad noted.

## 2024-04-21 ENCOUNTER — Other Ambulatory Visit: Payer: Self-pay | Admitting: Family Medicine

## 2024-04-21 ENCOUNTER — Ambulatory Visit
Admission: RE | Admit: 2024-04-21 | Discharge: 2024-04-21 | Disposition: A | Discharge status: Home / Self Care | Source: Ambulatory Visit | Attending: Family Medicine | Admitting: Family Medicine

## 2024-04-21 DIAGNOSIS — M542 Cervicalgia: Secondary | ICD-10-CM

## 2024-04-24 ENCOUNTER — Ambulatory Visit

## 2024-04-24 ENCOUNTER — Other Ambulatory Visit (HOSPITAL_BASED_OUTPATIENT_CLINIC_OR_DEPARTMENT_OTHER): Payer: Self-pay | Admitting: Family Medicine

## 2024-04-24 DIAGNOSIS — S62102A Fracture of unspecified carpal bone, left wrist, initial encounter for closed fracture: Secondary | ICD-10-CM

## 2024-05-05 ENCOUNTER — Other Ambulatory Visit: Payer: Self-pay | Admitting: General Surgery

## 2024-05-05 DIAGNOSIS — C50412 Malignant neoplasm of upper-outer quadrant of left female breast: Secondary | ICD-10-CM

## 2024-06-14 ENCOUNTER — Ambulatory Visit: Payer: Self-pay | Admitting: General Surgery

## 2024-06-14 ENCOUNTER — Ambulatory Visit: Attending: General Surgery

## 2024-06-14 ENCOUNTER — Inpatient Hospital Stay: Admission: RE | Admit: 2024-06-14 | Discharge: 2024-06-14 | Attending: General Surgery | Admitting: General Surgery

## 2024-06-14 DIAGNOSIS — Z170 Estrogen receptor positive status [ER+]: Secondary | ICD-10-CM

## 2024-06-14 DIAGNOSIS — Z483 Aftercare following surgery for neoplasm: Secondary | ICD-10-CM | POA: Insufficient documentation

## 2024-06-14 MED ORDER — GADOPICLENOL 0.5 MMOL/ML IV SOLN
10.0000 mL | Freq: Once | INTRAVENOUS | Status: AC | PRN
  Administered 2024-06-14: 10 mL via INTRAVENOUS
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
## 2024-06-15 ENCOUNTER — Other Ambulatory Visit: Payer: Self-pay

## 2024-08-10 ENCOUNTER — Inpatient Hospital Stay: Attending: Hematology & Oncology

## 2024-08-10 ENCOUNTER — Ambulatory Visit: Admitting: Hematology & Oncology

## 2024-12-11 ENCOUNTER — Ambulatory Visit
